# Patient Record
Sex: Female | Born: 1950 | Race: White | Hispanic: No | Smoking: Current every day smoker
Health system: Southern US, Community
[De-identification: ages and names within clinical notes are randomized; demographics above are authoritative.]

## PROBLEM LIST (undated history)

## (undated) DIAGNOSIS — Z9289 Personal history of other medical treatment: Secondary | ICD-10-CM

## (undated) DIAGNOSIS — K279 Peptic ulcer, site unspecified, unspecified as acute or chronic, without hemorrhage or perforation: Secondary | ICD-10-CM

## (undated) DIAGNOSIS — I2510 Atherosclerotic heart disease of native coronary artery without angina pectoris: Secondary | ICD-10-CM

## (undated) DIAGNOSIS — E039 Hypothyroidism, unspecified: Secondary | ICD-10-CM

## (undated) DIAGNOSIS — F32A Depression, unspecified: Secondary | ICD-10-CM

## (undated) DIAGNOSIS — D509 Iron deficiency anemia, unspecified: Secondary | ICD-10-CM

## (undated) DIAGNOSIS — E785 Hyperlipidemia, unspecified: Secondary | ICD-10-CM

## (undated) DIAGNOSIS — M4800 Spinal stenosis, site unspecified: Secondary | ICD-10-CM

## (undated) DIAGNOSIS — I739 Peripheral vascular disease, unspecified: Secondary | ICD-10-CM

## (undated) DIAGNOSIS — I10 Essential (primary) hypertension: Secondary | ICD-10-CM

## (undated) DIAGNOSIS — G8929 Other chronic pain: Secondary | ICD-10-CM

## (undated) DIAGNOSIS — I5022 Chronic systolic (congestive) heart failure: Secondary | ICD-10-CM

## (undated) DIAGNOSIS — K219 Gastro-esophageal reflux disease without esophagitis: Secondary | ICD-10-CM

## (undated) DIAGNOSIS — Z9981 Dependence on supplemental oxygen: Secondary | ICD-10-CM

## (undated) DIAGNOSIS — M25551 Pain in right hip: Secondary | ICD-10-CM

## (undated) DIAGNOSIS — M549 Dorsalgia, unspecified: Secondary | ICD-10-CM

## (undated) DIAGNOSIS — M359 Systemic involvement of connective tissue, unspecified: Secondary | ICD-10-CM

## (undated) DIAGNOSIS — F329 Major depressive disorder, single episode, unspecified: Secondary | ICD-10-CM

## (undated) DIAGNOSIS — I428 Other cardiomyopathies: Secondary | ICD-10-CM

## (undated) DIAGNOSIS — M1990 Unspecified osteoarthritis, unspecified site: Secondary | ICD-10-CM

## (undated) DIAGNOSIS — J189 Pneumonia, unspecified organism: Secondary | ICD-10-CM

## (undated) HISTORY — DX: Essential (primary) hypertension: I10

## (undated) HISTORY — DX: Hyperlipidemia, unspecified: E78.5

## (undated) HISTORY — DX: Hypothyroidism, unspecified: E03.9

## (undated) HISTORY — DX: Iron deficiency anemia, unspecified: D50.9

## (undated) HISTORY — DX: Spinal stenosis, site unspecified: M48.00

## (undated) HISTORY — DX: Peptic ulcer, site unspecified, unspecified as acute or chronic, without hemorrhage or perforation: K27.9

## (undated) HISTORY — DX: Major depressive disorder, single episode, unspecified: F32.9

## (undated) HISTORY — DX: Other chronic pain: G89.29

## (undated) HISTORY — DX: Chronic systolic (congestive) heart failure: I50.22

## (undated) HISTORY — PX: BLADDER REPAIR: SHX76

## (undated) HISTORY — DX: Other cardiomyopathies: I42.8

## (undated) HISTORY — DX: Dorsalgia, unspecified: M54.9

## (undated) HISTORY — DX: Atherosclerotic heart disease of native coronary artery without angina pectoris: I25.10

## (undated) HISTORY — DX: Depression, unspecified: F32.A

---
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
## 1979-01-05 HISTORY — PX: DILATION AND CURETTAGE OF UTERUS: SHX78

## 1989-05-06 HISTORY — PX: ABDOMINAL HYSTERECTOMY: SHX81

## 2004-11-30 ENCOUNTER — Ambulatory Visit: Payer: Self-pay | Admitting: Family Medicine

## 2005-05-06 HISTORY — PX: CARDIAC CATHETERIZATION: SHX172

## 2005-07-30 ENCOUNTER — Ambulatory Visit: Payer: Self-pay | Admitting: Family Medicine

## 2007-12-25 ENCOUNTER — Emergency Department: Payer: Self-pay | Admitting: Emergency Medicine

## 2008-01-08 ENCOUNTER — Ambulatory Visit: Payer: Self-pay | Admitting: Unknown Physician Specialty

## 2009-10-04 ENCOUNTER — Ambulatory Visit: Payer: Self-pay | Admitting: Internal Medicine

## 2009-10-11 ENCOUNTER — Inpatient Hospital Stay: Payer: Self-pay | Admitting: Internal Medicine

## 2009-10-27 ENCOUNTER — Ambulatory Visit: Payer: Self-pay | Admitting: Internal Medicine

## 2009-11-03 ENCOUNTER — Ambulatory Visit: Payer: Self-pay | Admitting: Internal Medicine

## 2009-12-04 ENCOUNTER — Ambulatory Visit: Payer: Self-pay | Admitting: Internal Medicine

## 2012-05-19 LAB — COMPREHENSIVE METABOLIC PANEL
Alkaline Phosphatase: 193 U/L — ABNORMAL HIGH (ref 50–136)
Anion Gap: 6 — ABNORMAL LOW (ref 7–16)
Bilirubin,Total: 0.3 mg/dL (ref 0.2–1.0)
Chloride: 103 mmol/L (ref 98–107)
Glucose: 94 mg/dL (ref 65–99)
Osmolality: 267 (ref 275–301)
Total Protein: 7.2 g/dL (ref 6.4–8.2)

## 2012-05-19 LAB — CBC
HCT: 34.9 % — ABNORMAL LOW (ref 35.0–47.0)
MCH: 32.5 pg (ref 26.0–34.0)
MCV: 102 fL — ABNORMAL HIGH (ref 80–100)
Platelet: 325 10*3/uL (ref 150–440)
RDW: 17.3 % — ABNORMAL HIGH (ref 11.5–14.5)
WBC: 5.3 10*3/uL (ref 3.6–11.0)

## 2012-05-20 ENCOUNTER — Observation Stay: Payer: Self-pay | Admitting: Internal Medicine

## 2012-05-20 LAB — URINALYSIS, COMPLETE
Bacteria: NONE SEEN
Glucose,UR: NEGATIVE mg/dL (ref 0–75)
Hyaline Cast: 27
Ketone: NEGATIVE
Ph: 5 (ref 4.5–8.0)
Protein: NEGATIVE
RBC,UR: 15 /HPF (ref 0–5)
Squamous Epithelial: 2
WBC UR: 22 /HPF (ref 0–5)

## 2012-05-20 LAB — IRON AND TIBC
Iron Bind.Cap.(Total): 213 ug/dL — ABNORMAL LOW (ref 250–450)
Iron Saturation: 31 %
Iron: 67 ug/dL (ref 50–170)
Unbound Iron-Bind.Cap.: 146 ug/dL

## 2012-05-20 LAB — FERRITIN: Ferritin (ARMC): 33 ng/mL (ref 8–388)

## 2012-05-20 LAB — TSH: Thyroid Stimulating Horm: >100 u[IU]/mL

## 2012-05-20 LAB — CK-MB: CK-MB: 3.4 ng/mL (ref 0.5–3.6)

## 2012-05-20 LAB — FOLATE: Folic Acid: 15.6 ng/mL (ref 3.1–100.0)

## 2012-08-06 ENCOUNTER — Ambulatory Visit: Payer: Self-pay | Admitting: Internal Medicine

## 2012-10-06 LAB — CBC WITH DIFFERENTIAL/PLATELET
HCT: 34.3 % — ABNORMAL LOW (ref 35.0–47.0)
HGB: 12 g/dL (ref 12.0–16.0)
MCV: 100 fL (ref 80–100)
Monocytes: 2 %
RDW: 17.5 % — ABNORMAL HIGH (ref 11.5–14.5)

## 2012-10-06 LAB — COMPREHENSIVE METABOLIC PANEL
Albumin: 1.9 g/dL — ABNORMAL LOW (ref 3.4–5.0)
Alkaline Phosphatase: 611 U/L — ABNORMAL HIGH (ref 50–136)
Anion Gap: 12 (ref 7–16)
Bilirubin,Total: 1.1 mg/dL — ABNORMAL HIGH (ref 0.2–1.0)
Calcium, Total: 7.2 mg/dL — ABNORMAL LOW (ref 8.5–10.1)
Chloride: 99 mmol/L (ref 98–107)
Creatinine: 1.04 mg/dL (ref 0.60–1.30)
EGFR (African American): >60
Osmolality: 266 (ref 275–301)
SGOT(AST): 133 U/L — ABNORMAL HIGH (ref 15–37)
SGPT (ALT): 33 U/L (ref 12–78)

## 2012-10-06 LAB — CK TOTAL AND CKMB (NOT AT ARMC): CK-MB: 4.4 ng/mL — ABNORMAL HIGH (ref 0.5–3.6)

## 2012-10-06 LAB — TROPONIN I: Troponin-I: 0.03 ng/mL

## 2012-10-06 LAB — ETHANOL: Ethanol: <3 mg/dL

## 2012-10-07 ENCOUNTER — Inpatient Hospital Stay: Payer: Self-pay | Admitting: Psychiatry

## 2012-10-07 LAB — DRUG SCREEN, URINE
Amphetamines, Ur Screen: NEGATIVE (ref ?–1000)
Barbiturates, Ur Screen: NEGATIVE (ref ?–200)
Benzodiazepine, Ur Scrn: POSITIVE (ref ?–200)
Cocaine Metabolite,Ur ~~LOC~~: NEGATIVE (ref ?–300)
Methadone, Ur Screen: NEGATIVE (ref ?–300)
Opiate, Ur Screen: POSITIVE (ref ?–300)
Phencyclidine (PCP) Ur S: NEGATIVE (ref ?–25)
Tricyclic, Ur Screen: NEGATIVE (ref ?–1000)

## 2012-10-07 LAB — URINALYSIS, COMPLETE
Bilirubin,UR: NEGATIVE
Glucose,UR: NEGATIVE mg/dL (ref 0–75)
Hyaline Cast: 13
Nitrite: POSITIVE
Ph: 6 (ref 4.5–8.0)
WBC UR: 162 /HPF (ref 0–5)

## 2012-10-10 LAB — CREATININE, SERUM
Creatinine: 0.91 mg/dL (ref 0.60–1.30)
EGFR (African American): >60

## 2012-10-12 LAB — CULTURE, BLOOD (SINGLE)

## 2012-11-12 ENCOUNTER — Other Ambulatory Visit: Payer: Self-pay | Admitting: Family Medicine

## 2012-11-12 LAB — CBC WITH DIFFERENTIAL/PLATELET
Basophil #: 0 10*3/uL (ref 0.0–0.1)
Eosinophil #: 0.1 10*3/uL (ref 0.0–0.7)
Eosinophil %: 1.9 %
HGB: 9.5 g/dL — ABNORMAL LOW (ref 12.0–16.0)
MCH: 33.1 pg (ref 26.0–34.0)
MCV: 104 fL — ABNORMAL HIGH (ref 80–100)
Monocyte %: 8 %
Neutrophil #: 4.6 10*3/uL (ref 1.4–6.5)
Neutrophil %: 64.9 %
Platelet: 472 10*3/uL — ABNORMAL HIGH (ref 150–440)
RDW: 14.9 % — ABNORMAL HIGH (ref 11.5–14.5)
WBC: 7 10*3/uL (ref 3.6–11.0)

## 2012-11-12 LAB — COMPREHENSIVE METABOLIC PANEL
Alkaline Phosphatase: 582 U/L — ABNORMAL HIGH (ref 50–136)
BUN: 21 mg/dL — ABNORMAL HIGH (ref 7–18)
Calcium, Total: 9.2 mg/dL (ref 8.5–10.1)
Chloride: 112 mmol/L — ABNORMAL HIGH (ref 98–107)
Glucose: 120 mg/dL — ABNORMAL HIGH (ref 65–99)
Osmolality: 287 (ref 275–301)
Potassium: 5.1 mmol/L (ref 3.5–5.1)
Sodium: 142 mmol/L (ref 136–145)
Total Protein: 6.1 g/dL — ABNORMAL LOW (ref 6.4–8.2)

## 2012-11-12 LAB — URINALYSIS, COMPLETE
Bilirubin,UR: NEGATIVE
Blood: NEGATIVE
Glucose,UR: NEGATIVE mg/dL (ref 0–75)
Hyaline Cast: 12
Ketone: NEGATIVE
RBC,UR: <1 /HPF (ref 0–5)
Squamous Epithelial: 1

## 2012-11-12 LAB — SEDIMENTATION RATE: Erythrocyte Sed Rate: 72 mm/hr — ABNORMAL HIGH (ref 0–30)

## 2012-11-16 LAB — URINE CULTURE

## 2013-06-07 ENCOUNTER — Ambulatory Visit: Payer: Self-pay | Admitting: Family Medicine

## 2013-09-05 DIAGNOSIS — I2510 Atherosclerotic heart disease of native coronary artery without angina pectoris: Secondary | ICD-10-CM | POA: Diagnosis present

## 2014-02-08 ENCOUNTER — Inpatient Hospital Stay: Payer: Self-pay | Admitting: Internal Medicine

## 2014-02-08 LAB — TROPONIN I
TROPONIN-I: 0.08 ng/mL — AB
Troponin-I: 0.08 ng/mL — ABNORMAL HIGH

## 2014-02-08 LAB — COMPREHENSIVE METABOLIC PANEL
ALBUMIN: 3.2 g/dL — AB (ref 3.4–5.0)
ALT: 7 U/L — AB
Alkaline Phosphatase: 137 U/L — ABNORMAL HIGH
Anion Gap: 6 — ABNORMAL LOW (ref 7–16)
BILIRUBIN TOTAL: 0.5 mg/dL (ref 0.2–1.0)
BUN: 9 mg/dL (ref 7–18)
CREATININE: 0.85 mg/dL (ref 0.60–1.30)
Calcium, Total: 8.4 mg/dL — ABNORMAL LOW (ref 8.5–10.1)
Chloride: 106 mmol/L (ref 98–107)
Co2: 30 mmol/L (ref 21–32)
EGFR (African American): >60
EGFR (Non-African Amer.): >60
Glucose: 93 mg/dL (ref 65–99)
OSMOLALITY: 282 (ref 275–301)
POTASSIUM: 2.4 mmol/L — AB (ref 3.5–5.1)
SGOT(AST): 18 U/L (ref 15–37)
SODIUM: 142 mmol/L (ref 136–145)
Total Protein: 7.1 g/dL (ref 6.4–8.2)

## 2014-02-08 LAB — CK TOTAL AND CKMB (NOT AT ARMC)
CK, Total: 76 U/L
CK, Total: 76 U/L
CK-MB: 1.5 ng/mL (ref 0.5–3.6)
CK-MB: 1.5 ng/mL (ref 0.5–3.6)

## 2014-02-08 LAB — MAGNESIUM: Magnesium: 1.5 mg/dL — ABNORMAL LOW

## 2014-02-08 LAB — CBC
HCT: 36.6 % (ref 35.0–47.0)
HGB: 11.5 g/dL — ABNORMAL LOW (ref 12.0–16.0)
MCH: 25.6 pg — AB (ref 26.0–34.0)
MCHC: 31.5 g/dL — ABNORMAL LOW (ref 32.0–36.0)
MCV: 81 fL (ref 80–100)
PLATELETS: 348 10*3/uL (ref 150–440)
RBC: 4.51 10*6/uL (ref 3.80–5.20)
RDW: 18.4 % — ABNORMAL HIGH (ref 11.5–14.5)
WBC: 8.9 10*3/uL (ref 3.6–11.0)

## 2014-02-08 LAB — TSH: Thyroid Stimulating Horm: 45.4 u[IU]/mL — ABNORMAL HIGH

## 2014-02-08 LAB — PRO B NATRIURETIC PEPTIDE: B-TYPE NATIURETIC PEPTID: 21532 pg/mL — AB (ref 0–125)

## 2014-02-09 LAB — POTASSIUM: Potassium: 3.1 mmol/L — ABNORMAL LOW (ref 3.5–5.1)

## 2014-02-09 LAB — TROPONIN I: TROPONIN-I: 0.06 ng/mL — AB

## 2014-02-09 LAB — CK TOTAL AND CKMB (NOT AT ARMC)
CK, Total: 82 U/L
CK-MB: 1.9 ng/mL (ref 0.5–3.6)

## 2014-02-09 LAB — MAGNESIUM: Magnesium: 1.4 mg/dL — ABNORMAL LOW

## 2014-02-10 LAB — BASIC METABOLIC PANEL
ANION GAP: 6 — AB (ref 7–16)
BUN: 10 mg/dL (ref 7–18)
CHLORIDE: 100 mmol/L (ref 98–107)
CO2: 34 mmol/L — AB (ref 21–32)
Calcium, Total: 8.3 mg/dL — ABNORMAL LOW (ref 8.5–10.1)
Creatinine: 0.83 mg/dL (ref 0.60–1.30)
EGFR (African American): >60
EGFR (Non-African Amer.): >60
Glucose: 81 mg/dL (ref 65–99)
OSMOLALITY: 277 (ref 275–301)
Potassium: 3.4 mmol/L — ABNORMAL LOW (ref 3.5–5.1)
SODIUM: 140 mmol/L (ref 136–145)

## 2014-02-11 ENCOUNTER — Other Ambulatory Visit: Payer: Self-pay | Admitting: Nurse Practitioner

## 2014-02-11 ENCOUNTER — Telehealth: Payer: Self-pay

## 2014-02-11 ENCOUNTER — Encounter: Payer: Self-pay | Admitting: Nurse Practitioner

## 2014-02-11 DIAGNOSIS — I10 Essential (primary) hypertension: Secondary | ICD-10-CM

## 2014-02-11 DIAGNOSIS — I341 Nonrheumatic mitral (valve) prolapse: Secondary | ICD-10-CM

## 2014-02-11 DIAGNOSIS — R7989 Other specified abnormal findings of blood chemistry: Secondary | ICD-10-CM

## 2014-02-11 DIAGNOSIS — I5023 Acute on chronic systolic (congestive) heart failure: Secondary | ICD-10-CM

## 2014-02-11 LAB — BASIC METABOLIC PANEL
ANION GAP: 6 — AB (ref 7–16)
BUN: 15 mg/dL (ref 7–18)
CO2: 37 mmol/L — AB (ref 21–32)
Calcium, Total: 8.6 mg/dL (ref 8.5–10.1)
Chloride: 95 mmol/L — ABNORMAL LOW (ref 98–107)
Creatinine: 0.96 mg/dL (ref 0.60–1.30)
EGFR (African American): >60
EGFR (Non-African Amer.): >60
Glucose: 83 mg/dL (ref 65–99)
OSMOLALITY: 276 (ref 275–301)
Potassium: 3.2 mmol/L — ABNORMAL LOW (ref 3.5–5.1)
Sodium: 138 mmol/L (ref 136–145)

## 2014-02-11 LAB — TSH: THYROID STIMULATING HORM: 96.4 u[IU]/mL — AB

## 2014-02-11 LAB — T4, FREE: Free Thyroxine: 0.53 ng/dL — ABNORMAL LOW (ref 0.76–1.46)

## 2014-02-11 LAB — MAGNESIUM: MAGNESIUM: 1.6 mg/dL — AB

## 2014-02-11 NOTE — Telephone Encounter (Signed)
Attempted to contact pt regarding discharge from [facility] on 02/11/14.  No answer, no machine.

## 2014-02-15 ENCOUNTER — Encounter: Payer: Self-pay | Admitting: Physician Assistant

## 2014-02-16 NOTE — Progress Notes (Signed)
Patient Name: [name], [name] [birth year], MRN [id number]  Date of Encounter: 02/17/2014  Primary Care Provider:  No primary provider on file. Primary Cardiologist:  Previously [facility], now Dr. Rockey Situ, MD  Patient Profile:  63 y.o. female with history below here for hospital follow up after her recent admission from 10/6-10/9 for acute on chronic systolic CHF, demnad ischemia, hypokalemia, hypomagnesemia, and acute bronchitis.     Problem List:   Past Medical History  Diagnosis Date  . NICM (nonischemic cardiomyopathy)     a. 2008 Echo: EF 20% [facility]);  b. 2011 Echo: EF 50-55% ([facility]);  c. 02/2014 Echo: EF 20-25%, mod dil LA/RA, severe MR, mod-sev TR.  [name] Chronic systolic CHF (congestive heart failure)     a. 02/2014 Echo: EF 20-25%, severe MR, tricuspid regurg, mod dilated LA & RA  . HTN (hypertension)   . Hyperlipidemia   . Tobacco abuse   . Hypothyroidism     a. 02/2014 TSH 96.4.  . Spinal stenosis   . Chronic back pain   . PUD (peptic ulcer disease)   . Elevated troponin    Past Surgical History  Procedure Laterality Date  . Vaginal hysterectomy    . Cardiac catheterization  2007    [facility]     Allergies:  Allergies  Allergen Reactions  . Codeine     Hives      HPI:  63 y.o. female with the above problem list here for hospital follow up.   She has a h/o MI in 2007 and says that she underwent cath @ [facility] revealing nl cors. She was dx with NICM and medically managed. Per chart, EF was initally 20% but improved to 50-55% by 2011. She has not seen cardiology in several years. She says that she was in her usual state of health until about 2 wks prior to her admission into the hospital on 10/6. She [place] the [place] and says that maybe she overdid it on salt. While there, she noted increasing abdominal girth and gradual weight gain. When she returned home 10/2, she was very short of breath with minimal activity. She has Lasix to  use on a prn basis and took one 10/2 PM with good diuresis and drop in wt by six lbs. She continued to feel dyspneic and orthopneic and for that reason presented to [facility] on 10/6 for further eval. At [facility], BNP was elevated @ 21K and CXR showed CHF w/ interstitial edema. She was admitted and placed on IV Lasix with good diuresis and stable renal fxn. She was also found to be hypokalemic/magnesemic and required supplementation. She is minus 4L and wt is down 3 lbs for the admission. She has had intermittent, throbbing chest pain, which can last days at a time. Troponin was mildly elevated with a flat trend. TSH was found to be markedly elevated @ 96.4 and was found to be markedly elevated @ 96.4 and synthroid has been adjusted. Echo was done on 10/7 revealing and EF of 20-25% with sev MR and mod-sev TR. While in the hospital it was recommended that spiro be added to her medication regimen given her ongoing hypokalemia, she continue Lasix 40 mg daily, low-dose Coreg was added, she was continued on her ACEi. Planning for outpatient Lexiscan as it has been 8 years since her last one. It was suspected that the chest pain she experienced prior to her admission was related to her volume overload/hypothyroidism. She will also  plan for an outpatient echo as there was no murmur heard while inpatient. There was question if MR functional in the setting of LV dysfxn, elevated filling pressures, and volume overload.   Today she comes in stating she has a productive cough of green sputum. She is sore from coughing so much. Subjective fever and chills. Chest wall is sore, but no chest pain. She requests a cough medication. She reports her weight as being slightly higher (dry weight 135). Denies any increased dyspnea. Tolerating medications without issues.   Home Medications:  Prior to Admission medications   Not on File     Weights: Filed Weights   02/17/14 1345  Weight: 141 lb 12 oz (64.297 kg)     Review of  Systems:  All other systems reviewed and are otherwise negative except as noted above.  Physical Exam:  Blood pressure 138/60, pulse 85, height 5\' 5"  (1.651 m), weight 141 lb 12 oz (64.297 kg).  General: Pleasant, NAD Psych: Normal affect. Neuro: Alert and oriented X 3. Moves all extremities spontaneously. HEENT: Normal  Neck: Supple without bruits. Elevated JVD to the jaw. Lungs:  Resp regular and unlabored, CTA. Heart: RRR no s3, s4. 3/6 systolic murmur. Abdomen: Soft, non-tender, non-distended, BS + x 4.  Extremities: No clubbing, cyanosis or edema. DP/PT/Radials 2+ and equal bilaterally.   Accessory Clinical Findings:  EKG - NSR, 85, LVH, lateral st depression likely 2/2 LV strain   Assessment & Plan:  1. Chronic systolic CHF/NICM: -She has not taken her Lasix yet today - she reports her weight as being slightly higher (dry weight 135), advised her to go ahead and take her Lasix 40 mg when she gets home -Increase her Lasix to 60 mg x 1 tomorrow, then go back down to 40 mg daily -Continue spiro 12.5 mg daily, Coreg 3.125 mg bid, enalapril 5 mg bid   2. Severe MR: -Murmur is heard on today's exam -Repeat echo  -Medications per above  3. History of supply demand ischemia: -Likely 2/2 volume overload. Flat trend with a peak of 0.08 -Lexiscan Myoview (last ischemic evaluation 8 years ago) -Will likely need cardiac catheterization   4. HTN: -Controlled -Continue current medications   5. Tobacco abuse: -Complete cessation advised  6. Hypothyroidism:  -Synthroid was increased from 75 mcg to 125 mcg during this past hospital admission - follow up with IM/Endocrine  7. Hypokalemia/hypomagnesemia: -Spiro 12.5 mg daily was added during past admission -Check BMET/Mg  8. URI: -Her chest wall is sore from increased coughing. This is reproducible to palpation.  -Tussionex 1 tsp po q 12 hours prn cough, SED   Christell Faith, PA-C [facility] [address] [place], [place] [id number] [phone] [place] 02/17/2014, 5:40 PM

## 2014-02-17 ENCOUNTER — Ambulatory Visit (INDEPENDENT_AMBULATORY_CARE_PROVIDER_SITE_OTHER): Payer: Medicare Other | Admitting: Physician Assistant

## 2014-02-17 ENCOUNTER — Encounter: Payer: Self-pay | Admitting: Physician Assistant

## 2014-02-17 VITALS — BP 138/60 | HR 85 | Ht 65.0 in | Wt 141.8 lb

## 2014-02-17 DIAGNOSIS — R778 Other specified abnormalities of plasma proteins: Secondary | ICD-10-CM

## 2014-02-17 DIAGNOSIS — R7989 Other specified abnormal findings of blood chemistry: Secondary | ICD-10-CM

## 2014-02-17 DIAGNOSIS — E038 Other specified hypothyroidism: Secondary | ICD-10-CM

## 2014-02-17 DIAGNOSIS — I340 Nonrheumatic mitral (valve) insufficiency: Secondary | ICD-10-CM

## 2014-02-17 DIAGNOSIS — R0602 Shortness of breath: Secondary | ICD-10-CM

## 2014-02-17 DIAGNOSIS — I5022 Chronic systolic (congestive) heart failure: Secondary | ICD-10-CM

## 2014-02-17 DIAGNOSIS — R079 Chest pain, unspecified: Secondary | ICD-10-CM

## 2014-02-17 DIAGNOSIS — Z720 Tobacco use: Secondary | ICD-10-CM

## 2014-02-17 MED ORDER — HYDROCOD POLST-CHLORPHEN POLST 10-8 MG/5ML PO LQCR: 5.0000 mL | Freq: Two times a day (BID) | ORAL | Status: DC | PRN

## 2014-02-17 NOTE — Patient Instructions (Addendum)
Please increase your Lasix to 60mg  tomorrow, Then resume your regular dose of 40mg  daily  We will draw labs today:  CBC, BMET  Please follow up in 2 weeks w/ Christell Faith, PA  [place]  Your caregiver has ordered a Stress Test with nuclear imaging. The purpose of this test is to evaluate the blood supply to your heart muscle. This procedure is referred to as a "Non-Invasive Stress Test." This is because other than having an IV started in your vein, nothing is inserted or "invades" your body. Cardiac stress tests are done to find areas of poor blood flow to the heart by determining the extent of coronary artery disease (CAD). Some patients exercise on a treadmill, which naturally increases the blood flow to your heart, while others who are  unable to walk on a treadmill due to physical limitations have a pharmacologic/chemical stress agent called Lexiscan . This medicine will mimic walking on a treadmill by temporarily increasing your coronary blood flow.   Please note: these test may take anywhere between 2-4 hours to complete  PLEASE REPORT TO [place] AT THE FIRST DESK WILL DIRECT YOU WHERE TO GO  Date of Procedure:____Thursday, October 22______________  Arrival Time for Procedure:_______7:45am_________________  Instructions regarding medication:   __X__:  Hold betablocker(s) night before procedure and morning of procedure:  CARVEDILOL   PLEASE NOTIFY THE OFFICE AT LEAST 24 HOURS IN ADVANCE IF YOU ARE UNABLE TO KEEP YOUR APPOINTMENT.  [phone] AND  PLEASE NOTIFY NUCLEAR MEDICINE AT [facility] AT LEAST 24 HOURS IN ADVANCE IF YOU ARE UNABLE TO KEEP YOUR APPOINTMENT. [phone]  How to prepare for your Myoview test:  1. Do not eat or drink after midnight 2. No caffeine for 24 hours prior to test 3. No smoking 24 hours prior to test. 4. Your medication may be taken with water.  If your doctor stopped a medication because of this test, do not take  that medication. 5. Ladies, please do not wear dresses.  Skirts or pants are appropriate. Please wear a short sleeve shirt. 6. No perfume, cologne or lotion.

## 2014-02-18 LAB — CBC WITH DIFFERENTIAL
Basophils Absolute: 0 10*3/uL (ref 0.0–0.2)
Basos: 1 %
EOS ABS: 0.2 10*3/uL (ref 0.0–0.4)
EOS: 3 %
HCT: 33.8 % — ABNORMAL LOW (ref 34.0–46.6)
Hemoglobin: 10.8 g/dL — ABNORMAL LOW (ref 11.1–15.9)
IMMATURE GRANULOCYTES: 0 %
Immature Grans (Abs): 0 10*3/uL (ref 0.0–0.1)
Lymphocytes Absolute: 2.3 10*3/uL (ref 0.7–3.1)
Lymphs: 26 %
MCH: 25.3 pg — AB (ref 26.6–33.0)
MCHC: 32 g/dL (ref 31.5–35.7)
MCV: 79 fL (ref 79–97)
MONOS ABS: 0.5 10*3/uL (ref 0.1–0.9)
Monocytes: 6 %
NEUTROS PCT: 64 %
Neutrophils Absolute: 5.8 10*3/uL (ref 1.4–7.0)
Platelets: 368 10*3/uL (ref 150–379)
RBC: 4.27 x10E6/uL (ref 3.77–5.28)
RDW: 19.3 % — ABNORMAL HIGH (ref 12.3–15.4)
WBC: 8.8 10*3/uL (ref 3.4–10.8)

## 2014-02-18 LAB — BASIC METABOLIC PANEL
BUN/Creatinine Ratio: 11 (ref 11–26)
BUN: 8 mg/dL (ref 8–27)
CALCIUM: 8.7 mg/dL (ref 8.7–10.3)
CO2: 27 mmol/L (ref 18–29)
Chloride: 101 mmol/L (ref 97–108)
Creatinine, Ser: 0.75 mg/dL (ref 0.57–1.00)
GFR calc Af Amer: 97 mL/min/{1.73_m2} (ref >59)
GFR calc non Af Amer: 85 mL/min/{1.73_m2} (ref >59)
Glucose: 99 mg/dL (ref 65–99)
POTASSIUM: 3.7 mmol/L (ref 3.5–5.2)
SODIUM: 142 mmol/L (ref 134–144)

## 2014-02-24 ENCOUNTER — Ambulatory Visit: Payer: Self-pay | Admitting: Cardiovascular Disease

## 2014-02-24 DIAGNOSIS — R079 Chest pain, unspecified: Secondary | ICD-10-CM

## 2014-02-25 ENCOUNTER — Other Ambulatory Visit: Payer: Self-pay | Admitting: *Deleted

## 2014-02-25 DIAGNOSIS — R0602 Shortness of breath: Secondary | ICD-10-CM

## 2014-02-25 DIAGNOSIS — I5022 Chronic systolic (congestive) heart failure: Secondary | ICD-10-CM

## 2014-02-25 DIAGNOSIS — R079 Chest pain, unspecified: Secondary | ICD-10-CM

## 2014-02-25 DIAGNOSIS — R778 Other specified abnormalities of plasma proteins: Secondary | ICD-10-CM

## 2014-02-25 DIAGNOSIS — R7989 Other specified abnormal findings of blood chemistry: Secondary | ICD-10-CM

## 2014-03-01 ENCOUNTER — Encounter: Payer: Self-pay | Admitting: Physician Assistant

## 2014-03-03 ENCOUNTER — Encounter: Payer: Self-pay | Admitting: Physician Assistant

## 2014-03-03 ENCOUNTER — Ambulatory Visit (INDEPENDENT_AMBULATORY_CARE_PROVIDER_SITE_OTHER): Payer: Medicare Other | Admitting: Physician Assistant

## 2014-03-03 VITALS — BP 158/81 | HR 100 | Ht 65.0 in | Wt 141.0 lb

## 2014-03-03 DIAGNOSIS — I429 Cardiomyopathy, unspecified: Secondary | ICD-10-CM

## 2014-03-03 DIAGNOSIS — I340 Nonrheumatic mitral (valve) insufficiency: Secondary | ICD-10-CM

## 2014-03-03 DIAGNOSIS — I428 Other cardiomyopathies: Secondary | ICD-10-CM

## 2014-03-03 DIAGNOSIS — E038 Other specified hypothyroidism: Secondary | ICD-10-CM

## 2014-03-03 DIAGNOSIS — I10 Essential (primary) hypertension: Secondary | ICD-10-CM

## 2014-03-03 DIAGNOSIS — R079 Chest pain, unspecified: Secondary | ICD-10-CM

## 2014-03-03 MED ORDER — CARVEDILOL 6.25 MG PO TABS: 6.2500 mg | ORAL_TABLET | Freq: Two times a day (BID) | ORAL | Status: DC

## 2014-03-03 NOTE — Patient Instructions (Addendum)
[facility] Cardiac Cath Instructions   You are scheduled for a Cardiac Cath on:_____11/12/15____________________  Please arrive at _0730______am on the day of your procedure  You will need to pre-register prior to the day of your procedure.  Enter through the [employer] at [facility].  Registration is the first desk on your right.  Please take the procedure order we have given you in order to be registered appropriately  Do not eat/drink anything after midnight  Someone will need to drive you home  It is recommended someone be with you for the first 24 hours after your procedure  Wear clothes that are easy to get on/off and wear slip on shoes if possible   Medications bring a current list of all medications with you  _x__ You may take all of your medications the morning of your procedure with enough water to swallow safely   Day of your procedure: Arrive at the [place] entrance.  Free valet service is available.  After entering the [place] please check-in at the registration desk (1st desk on your right) to receive your armband. After receiving your armband someone will escort you to the cardiac cath/special procedures waiting area.  The usual length of stay after your procedure is about 2 to 3 hours.  This can vary.  If you have any questions, please call our office at [phone], or you may call the cardiac cath lab at [facility] directly at [phone]  Your physician has recommended you make the following change in your medication:  Increase Coreg to 6.25 mg twice daily   Please follow up with PCP for Oxygen

## 2014-03-03 NOTE — Progress Notes (Signed)
Patient Name: [name], [name] January 29, 1951, MRN [id number]  Date of Encounter: 03/03/2014  Primary Care Provider:  No primary provider on file. Primary Cardiologist:  Dr. Rockey Situ, MD  Patient Profile:  63 y.o. female with history below here to discuss her cardiac cath results.    Problem List:   Past Medical History  Diagnosis Date  . NICM (nonischemic cardiomyopathy)     a. 2008 Echo: EF 20% [facility]);  b. 2011 Echo: EF 50-55% ([facility]);  c. 02/2014 Echo: EF 20-25%, mod dil LA/RA, severe MR, mod-sev TR.  [name] Chronic systolic CHF (congestive heart failure)     a. 02/2014 Echo: EF 20-25%, severe MR, tricuspid regurg, mod dilated LA & RA  . HTN (hypertension)   . Hyperlipidemia   . Tobacco abuse   . Hypothyroidism     a. 02/2014 TSH 96.4.  . Spinal stenosis   . Chronic back pain   . PUD (peptic ulcer disease)   . History of nuclear stress test     a. 02/24/2014: Lexiscan Myoview: no sig ischemia, predominantly mild fixed defect noted in the inf wall on nonattenuation corrected images. On attenuation corrected images small mild perfusion defect in the apical & distal anterior wall w/ possible small mild ischemia. Mod global HK. No EKG changes concerning for ischemia. EF 35%. No artificat noted. Overall, moderate risk study.     Past Surgical History  Procedure Laterality Date  . Vaginal hysterectomy    . Cardiac catheterization  2007    [facility]     Allergies:  Allergies  Allergen Reactions  . Codeine     Hives      HPI:  63 y.o. female with the above problem list here to discuss her recent [place] results that she had on 02/25/2014.   Patient with history of MI in 2007 and says that she underwent cath @ [facility] revealing nl cors. She was dx with NICM and medically managed. Per chart, EF was initally 20% but improved to 50-55% by 2011. She has not seen cardiology in several years. She says that she was in her usual state of health until about 2 wks prior to her  admission into the hospital on 10/6. She went [place] her [place] to the mountains for a vacation and says that maybe she overdid it on salt. While there, she noted increasing abdominal girth and gradual weight gain. When she returned home 10/2, she was very short of breath with minimal activity. She has Lasix to use on a prn basis and took one 10/2 PM with good diuresis and drop in wt by six lbs. She continued to feel dyspneic and orthopneic and for that reason presented to [facility] on 10/6 for further eval. At [facility], BNP was elevated @ 21K and CXR showed CHF w/ interstitial edema. She was admitted and placed on IV Lasix with good diuresis and stable renal fxn. TnI was mildly elevated with a flat trend. TSH was found to be markedly elevated at 96.4. Echo was done on 02/09/2014 that showed an EF of 20-25% with severe MR and moderate to severe TR.   She underwent Lexiscan Myoview 02/25/2014 that showed mild fixed defect noted in the inferior wall on nonattenuation corrected images. Attenuation corrected images shows small mild perfusion defect in the apical and distal anterior wall with possible small mild ischemia in this region. Moderate global HK. LV global function was moderately reduced. No EKG changes concerning for ischemia. EF 35%. Moderate risk study.  She comes in today stating that she is tired. Does not feel like she has any energy. Denies any chest pain, diaphoresis, palpitations, edema, presyncope, or syncope.       Home Medications:  Prior to Admission medications   Medication Sig Start Date End Date Taking? Authorizing Provider  chlorpheniramine-HYDROcodone (TUSSIONEX PENNKINETIC ER) 10-8 MG/5ML LQCR Take 5 mLs by mouth every 12 (twelve) hours as needed for cough. 02/17/14  Yes Female Iafrate M Jeter Tomey, PA-C  enalapril (VASOTEC) 5 MG tablet Take 5 mg by mouth 2 (two) times daily.   Yes Historical Provider, MD  escitalopram (LEXAPRO) 5 MG tablet Take 5 mg by mouth daily.   Yes Historical Provider, MD    furosemide (LASIX) 40 MG tablet Take 40 mg by mouth daily.   Yes Historical Provider, MD  gabapentin (NEURONTIN) 300 MG capsule Take 300 mg by mouth 3 (three) times daily.   Yes Historical Provider, MD  HYDROcodone-acetaminophen (NORCO) 10-325 MG per tablet Take 1 tablet by mouth every 6 (six) hours as needed.   Yes Historical Provider, MD  levothyroxine (SYNTHROID, LEVOTHROID) 125 MCG tablet Take 125 mcg by mouth daily before breakfast.   Yes Historical Provider, MD  spironolactone (ALDACTONE) 25 MG tablet Take 12.5 mg by mouth daily.    Yes Historical Provider, MD  carvedilol (COREG) 6.25 MG tablet Take 1 tablet (6.25 mg total) by mouth 2 (two) times daily. 03/03/14   Rise Mu, PA-C     Weights: Filed Weights   03/03/14 1348  Weight: 141 lb (63.957 kg)     Review of Systems:  All other systems reviewed and are otherwise negative except as noted above.  Physical Exam:  Blood pressure 158/81, pulse 100, height 5\' 5"  (1.651 m), weight 141 lb (63.957 kg).  General: Pleasant, NAD Psych: Normal affect. Neuro: Alert and oriented X 3. Moves all extremities spontaneously. HEENT: Normal  Neck: Supple without bruits or JVD. Lungs:  Resp regular and unlabored, CTA. Heart: RRR no s3, s4. 2/6 systolic murmur. Abdomen: Soft, non-tender, non-distended, BS + x 4.  Extremities: No clubbing, cyanosis or edema. DP/PT/Radials 2+ and equal bilaterally.   Accessory Clinical Findings:  EKG - NSR, 100, nonspecific st depression  Assessment & Plan:  1. NICM: -Must rule out ischemia as a cause of her decreased EF, schedule for cardiac cath -EF 20-25% on echo on 02/09/2014, EF 35% during nuclear stress test 02/25/2014. Given the decreased EF, if there is no significant stenosis will plan for optimization of medical therapy, LifeVest during waiting period, followed by possible ICD placement if EF remains less than or equal to 35% -Increased Coreg to 6.25 mg bid -Pre-cath labs pending  2.  Severe MR: -Recent echo on 02/09/2014 showed EF 20-25%, severe MR, moderate to severe TR -Her valvular heart disease may certainly be playing a role in her CM -Optimize medical therapy at this time  -Post cath can visit with the idea of MVR  3. HTN: -Coreg increased today per above -Continue current medications  4. Severe hypothyroidism: -TSH of 96.4 during hospitalization in early October, currently on Synthroid 125 mcg -Untreated hypothyroidism certainly may also be a cause of her CM  5. Tobacco abuse: -Cessation is a must   Christell Faith, PA-C [place], [place] [address] [phone] [place] 03/03/2014, 2:41 PM

## 2014-03-04 LAB — CBC WITH DIFFERENTIAL
BASOS ABS: 0 10*3/uL (ref 0.0–0.2)
Basos: 1 %
EOS ABS: 0.1 10*3/uL (ref 0.0–0.4)
Eos: 1 %
HCT: 34.3 % (ref 34.0–46.6)
Hemoglobin: 10.4 g/dL — ABNORMAL LOW (ref 11.1–15.9)
Immature Grans (Abs): 0 10*3/uL (ref 0.0–0.1)
Immature Granulocytes: 0 %
LYMPHS: 28 %
Lymphocytes Absolute: 2 10*3/uL (ref 0.7–3.1)
MCH: 24.4 pg — ABNORMAL LOW (ref 26.6–33.0)
MCHC: 30.3 g/dL — ABNORMAL LOW (ref 31.5–35.7)
MCV: 80 fL (ref 79–97)
MONOS ABS: 0.6 10*3/uL (ref 0.1–0.9)
Monocytes: 9 %
NEUTROS ABS: 4.4 10*3/uL (ref 1.4–7.0)
Neutrophils Relative %: 61 %
Platelets: 384 10*3/uL — ABNORMAL HIGH (ref 150–379)
RBC: 4.27 x10E6/uL (ref 3.77–5.28)
RDW: 18.9 % — AB (ref 12.3–15.4)
WBC: 7 10*3/uL (ref 3.4–10.8)

## 2014-03-04 LAB — PROTIME-INR
INR: 1.6 — ABNORMAL HIGH (ref 0.8–1.2)
Prothrombin Time: 16.4 s — ABNORMAL HIGH (ref 9.1–12.0)

## 2014-03-04 LAB — BASIC METABOLIC PANEL
BUN/Creatinine Ratio: 15 (ref 11–26)
BUN: 11 mg/dL (ref 8–27)
CHLORIDE: 99 mmol/L (ref 97–108)
CO2: 25 mmol/L (ref 18–29)
Calcium: 8.9 mg/dL (ref 8.7–10.3)
Creatinine, Ser: 0.71 mg/dL (ref 0.57–1.00)
GFR calc non Af Amer: 90 mL/min/{1.73_m2} (ref >59)
GFR, EST AFRICAN AMERICAN: 104 mL/min/{1.73_m2} (ref >59)
GLUCOSE: 98 mg/dL (ref 65–99)
POTASSIUM: 3.5 mmol/L (ref 3.5–5.2)
SODIUM: 141 mmol/L (ref 134–144)

## 2014-03-09 ENCOUNTER — Other Ambulatory Visit: Payer: Self-pay

## 2014-03-09 DIAGNOSIS — D509 Iron deficiency anemia, unspecified: Secondary | ICD-10-CM

## 2014-03-09 DIAGNOSIS — Z01812 Encounter for preprocedural laboratory examination: Secondary | ICD-10-CM

## 2014-03-15 ENCOUNTER — Other Ambulatory Visit (INDEPENDENT_AMBULATORY_CARE_PROVIDER_SITE_OTHER): Payer: Medicare Other

## 2014-03-15 DIAGNOSIS — Z01812 Encounter for preprocedural laboratory examination: Secondary | ICD-10-CM

## 2014-03-15 DIAGNOSIS — D509 Iron deficiency anemia, unspecified: Secondary | ICD-10-CM

## 2014-03-16 ENCOUNTER — Ambulatory Visit: Payer: Self-pay | Admitting: Physician Assistant

## 2014-03-16 LAB — CBC WITH DIFFERENTIAL/PLATELET
BASOS PCT: 0.9 %
Basophil #: 0.1 10*3/uL (ref 0.0–0.1)
Eosinophil #: 0.1 10*3/uL (ref 0.0–0.7)
Eosinophil %: 0.8 %
HCT: 33.5 % — AB (ref 35.0–47.0)
HGB: 10.5 g/dL — AB (ref 12.0–16.0)
LYMPHS ABS: 1.7 10*3/uL (ref 1.0–3.6)
Lymphocyte %: 24.9 %
MCH: 24.8 pg — ABNORMAL LOW (ref 26.0–34.0)
MCHC: 31.3 g/dL — ABNORMAL LOW (ref 32.0–36.0)
MCV: 79 fL — ABNORMAL LOW (ref 80–100)
Monocyte #: 0.5 x10 3/mm (ref 0.2–0.9)
Monocyte %: 7.2 %
Neutrophil #: 4.6 10*3/uL (ref 1.4–6.5)
Neutrophil %: 66.2 %
Platelet: 412 10*3/uL (ref 150–440)
RBC: 4.23 10*6/uL (ref 3.80–5.20)
RDW: 18.9 % — ABNORMAL HIGH (ref 11.5–14.5)
WBC: 7 10*3/uL (ref 3.6–11.0)

## 2014-03-16 LAB — COMPREHENSIVE METABOLIC PANEL
ALBUMIN: 2.6 g/dL — AB (ref 3.4–5.0)
ALK PHOS: 171 U/L — AB
Anion Gap: 5 — ABNORMAL LOW (ref 7–16)
BUN: 9 mg/dL (ref 7–18)
Bilirubin,Total: 0.3 mg/dL (ref 0.2–1.0)
CO2: 31 mmol/L (ref 21–32)
CREATININE: 0.81 mg/dL (ref 0.60–1.30)
Calcium, Total: 8 mg/dL — ABNORMAL LOW (ref 8.5–10.1)
Chloride: 108 mmol/L — ABNORMAL HIGH (ref 98–107)
EGFR (African American): >60
EGFR (Non-African Amer.): >60
Glucose: 109 mg/dL — ABNORMAL HIGH (ref 65–99)
Osmolality: 286 (ref 275–301)
Potassium: 2.7 mmol/L — ABNORMAL LOW (ref 3.5–5.1)
SGOT(AST): 19 U/L (ref 15–37)
SGPT (ALT): <6 U/L — ABNORMAL LOW
Sodium: 144 mmol/L (ref 136–145)
Total Protein: 6.7 g/dL (ref 6.4–8.2)

## 2014-03-16 LAB — PROTIME-INR
INR: 1.2
PROTHROMBIN TIME: 15.3 s — AB (ref 11.5–14.7)

## 2014-03-17 ENCOUNTER — Ambulatory Visit: Payer: Self-pay | Admitting: Cardiovascular Disease

## 2014-03-17 DIAGNOSIS — I700 Atherosclerosis of aorta: Secondary | ICD-10-CM

## 2014-03-17 DIAGNOSIS — I209 Angina pectoris, unspecified: Secondary | ICD-10-CM

## 2014-03-18 DIAGNOSIS — E039 Hypothyroidism, unspecified: Secondary | ICD-10-CM | POA: Insufficient documentation

## 2014-03-18 DIAGNOSIS — Z9289 Personal history of other medical treatment: Secondary | ICD-10-CM | POA: Insufficient documentation

## 2014-03-18 DIAGNOSIS — M4800 Spinal stenosis, site unspecified: Secondary | ICD-10-CM | POA: Insufficient documentation

## 2014-03-18 DIAGNOSIS — Z720 Tobacco use: Secondary | ICD-10-CM | POA: Insufficient documentation

## 2014-03-18 DIAGNOSIS — E785 Hyperlipidemia, unspecified: Secondary | ICD-10-CM | POA: Insufficient documentation

## 2014-03-18 DIAGNOSIS — M7989 Other specified soft tissue disorders: Secondary | ICD-10-CM | POA: Insufficient documentation

## 2014-03-18 DIAGNOSIS — M549 Dorsalgia, unspecified: Secondary | ICD-10-CM

## 2014-03-18 DIAGNOSIS — G8929 Other chronic pain: Secondary | ICD-10-CM | POA: Insufficient documentation

## 2014-03-18 DIAGNOSIS — I10 Essential (primary) hypertension: Secondary | ICD-10-CM | POA: Insufficient documentation

## 2014-03-18 DIAGNOSIS — K279 Peptic ulcer, site unspecified, unspecified as acute or chronic, without hemorrhage or perforation: Secondary | ICD-10-CM | POA: Insufficient documentation

## 2014-03-18 DIAGNOSIS — I5033 Acute on chronic diastolic (congestive) heart failure: Secondary | ICD-10-CM | POA: Insufficient documentation

## 2014-03-24 ENCOUNTER — Encounter: Payer: Self-pay | Admitting: Cardiovascular Disease

## 2014-03-24 ENCOUNTER — Ambulatory Visit (INDEPENDENT_AMBULATORY_CARE_PROVIDER_SITE_OTHER): Payer: Medicare Other | Admitting: Cardiovascular Disease

## 2014-03-24 ENCOUNTER — Other Ambulatory Visit (HOSPITAL_COMMUNITY): Payer: Self-pay | Admitting: *Deleted

## 2014-03-24 ENCOUNTER — Encounter (INDEPENDENT_AMBULATORY_CARE_PROVIDER_SITE_OTHER): Payer: Medicare Other

## 2014-03-24 VITALS — BP 120/60 | HR 89 | Ht 65.0 in | Wt 131.2 lb

## 2014-03-24 DIAGNOSIS — I70219 Atherosclerosis of native arteries of extremities with intermittent claudication, unspecified extremity: Secondary | ICD-10-CM

## 2014-03-24 DIAGNOSIS — I159 Secondary hypertension, unspecified: Secondary | ICD-10-CM

## 2014-03-24 DIAGNOSIS — I428 Other cardiomyopathies: Secondary | ICD-10-CM

## 2014-03-24 DIAGNOSIS — Z720 Tobacco use: Secondary | ICD-10-CM

## 2014-03-24 DIAGNOSIS — E785 Hyperlipidemia, unspecified: Secondary | ICD-10-CM

## 2014-03-24 DIAGNOSIS — I739 Peripheral vascular disease, unspecified: Secondary | ICD-10-CM

## 2014-03-24 DIAGNOSIS — I5022 Chronic systolic (congestive) heart failure: Secondary | ICD-10-CM

## 2014-03-24 DIAGNOSIS — R0602 Shortness of breath: Secondary | ICD-10-CM

## 2014-03-24 DIAGNOSIS — I429 Cardiomyopathy, unspecified: Secondary | ICD-10-CM

## 2014-03-24 DIAGNOSIS — I70213 Atherosclerosis of native arteries of extremities with intermittent claudication, bilateral legs: Secondary | ICD-10-CM

## 2014-03-24 MED ORDER — CARVEDILOL 12.5 MG PO TABS: 12.5000 mg | ORAL_TABLET | Freq: Two times a day (BID) | ORAL | Status: DC

## 2014-03-24 MED ORDER — FUROSEMIDE 40 MG PO TABS: 40.0000 mg | ORAL_TABLET | Freq: Two times a day (BID) | ORAL | Status: DC | PRN

## 2014-03-24 NOTE — Assessment & Plan Note (Signed)
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 Lower extremity Doppler has been ordered. Will likely need angioplasty, possible stenting of her iliac arterial stenosis for symptom relief Case has been discussed with Dr. Fletcher Anon today.

## 2014-03-24 NOTE — Assessment & Plan Note (Signed)
We have recommended that she take extra Lasix in the afternoon on Monday Wednesday and Friday for periodic episodes of shortness of breath Continue 40 mg every morning

## 2014-03-24 NOTE — Progress Notes (Signed)
Patient ID: [name], female    DOB: [birth year], 63 y.o.   MRN: [id number]  HPI Comments: 63 year old woman with history of nonischemic cardiopathy, ejection fraction 20-25% presenting to the hospital 02/08/2014 with acute on chronic systolic CHF, treated with diuresis  Follow-up stress testing showing  mild fixed defect noted in the inferior wall on nonattenuation corrected images. Attenuation corrected images shows small mild perfusion defect in the apical and distal anterior wall with possible small mild ischemia  She had cardiac catheterization that showed no significant CAD She presents today for follow-up after recent cardiac catheterization  Cardiac catheterization showed no significant coronary disease. Severely depressed ejection fraction, global hypokinesis She had severe right common iliac arterial disease, significant disease of the internal iliac and right common femoral artery. She has claudication symptoms on a regular basis on the right, also has pain in her right buttock  She reports having periodic episodes of shortness of breath. She takes extra Lasix, and eventually her symptoms resolve. Denies having significant lower extremity edema. Not very active at baseline. No significant cough, no orthostasis, no chest pain Right groin has healed, still with a small knot  Cardiac catheterization results again reviewed with her from 03/17/2014 showing no significant CAD, ejection fraction 20%, PAD discussed with her EKG shows normal sinus rhythm with rate 89 bpm, nonspecific ST abnormality in the anterolateral leads    Other past medical history . NICM (nonischemic cardiomyopathy)    a. 2008 Echo: EF 20% [facility]);  b. 2011 Echo: EF 50-55% ([facility]);  c. 02/2014 Echo: EF 20-25%, mod dil LA/RA, severe MR, mod-sev TR. [name] Chronic systolic CHF (congestive heart failure)    a. 02/2014 Echo: EF 20-25%, severe MR, tricuspid regurg, mod dilated LA & RA . HTN  (hypertension)  . Hyperlipidemia  . Tobacco abuse  . Hypothyroidism    a. 02/2014 TSH 96.4. . Spinal stenosis  . Chronic back pain  . PUD (peptic ulcer disease)  . History of nuclear stress test    a. 02/24/2014: Lexiscan Myoview: no sig ischemia, predominantly mild fixed defect noted in the inf wall on nonattenuation corrected images. On attenuation corrected images small mild perfusion defect in the apical & distal anterior wall w/ possible small mild ischemia. Mod global HK. No EKG changes concerning for ischemia. EF 35%. No artificat noted. Overall, moderate risk study.        Social history  reports that she has been smoking Cigarettes.  She has a 20 pack-year smoking history. She does not have any smokeless tobacco history on file. She reports that she does not drink alcohol or use illicit drugs.    Outpatient Encounter Prescriptions as of 03/24/2014  Medication Sig  . carvedilol (COREG) 12.5 MG tablet Take 1 tablet (12.5 mg total) by mouth 2 (two) times daily.  . chlorpheniramine-HYDROcodone (TUSSIONEX PENNKINETIC ER) 10-8 MG/5ML LQCR Take 5 mLs by mouth every 12 (twelve) hours as needed for cough.  . enalapril (VASOTEC) 5 MG tablet Take 5 mg by mouth 2 (two) times daily.  [name] escitalopram (LEXAPRO) 5 MG tablet Take 5 mg by mouth daily.  . furosemide (LASIX) 40 MG tablet Take 1 tablet (40 mg total) by mouth 2 (two) times daily as needed.  . gabapentin (NEURONTIN) 300 MG capsule Take 300 mg by mouth 3 (three) times daily.  [name] HYDROcodone-acetaminophen (NORCO) 10-325 MG per tablet Take 1 tablet by mouth every 6 (six) hours as needed.  [name] levothyroxine (SYNTHROID, LEVOTHROID) 125 MCG tablet Take 125 mcg  by mouth daily before breakfast.  . spironolactone (ALDACTONE) 25 MG tablet Take 12.5 mg by mouth daily.   . [DISCONTINUED] carvedilol (COREG) 6.25 MG tablet Take 1 tablet (6.25 mg total) by mouth 2 (two) times daily.  . [DISCONTINUED] furosemide (LASIX) 40 MG tablet Take 40 mg by mouth  daily.    Review of Systems  Constitutional: Negative.   Respiratory: Positive for shortness of breath.   Cardiovascular: Negative.   Gastrointestinal: Negative.   Musculoskeletal: Negative.        Claudication symptoms on the right  Skin: Negative.   Neurological: Negative.   Hematological: Negative.   All other systems reviewed and are negative.   BP 120/60 mmHg  Pulse 89  Ht 5\' 5"  (1.651 m)  Wt 131 lb 4 oz (59.535 kg)  BMI 21.84 kg/m2  Physical Exam  Constitutional: She is oriented to person, place, and time. She appears well-developed and well-nourished.  HENT:  Head: Normocephalic.  Nose: Nose normal.  Mouth/Throat: Oropharynx is clear and moist.  Eyes: Conjunctivae are normal. Pupils are equal, round, and reactive to light.  Neck: Normal range of motion. Neck supple. No JVD present.  Cardiovascular: Normal rate, regular rhythm, S1 normal, S2 normal, normal heart sounds and intact distal pulses.  Exam reveals no gallop and no friction rub.   No murmur heard. Pulmonary/Chest: Effort normal and breath sounds normal. No respiratory distress. She has no wheezes. She has no rales. She exhibits no tenderness.  Abdominal: Soft. Bowel sounds are normal. She exhibits no distension. There is no tenderness.  Musculoskeletal: Normal range of motion. She exhibits no edema or tenderness.  Lymphadenopathy:    She has no cervical adenopathy.  Neurological: She is alert and oriented to person, place, and time. Coordination normal.  Skin: Skin is warm and dry. No rash noted. No erythema.  Psychiatric: She has a normal mood and affect. Her behavior is normal. Judgment and thought content normal.    Assessment and Plan  Nursing note and vitals reviewed.

## 2014-03-24 NOTE — Assessment & Plan Note (Signed)
We have encouraged him to continue to work on weaning his cigarettes and smoking cessation. He will continue to work on this and does not want any assistance with chantix.  

## 2014-03-24 NOTE — Assessment & Plan Note (Signed)
Severely depressed ejection fraction, nonischemic. We will increase her carvedilol up to 12.5 mg daily. Continue her other medications

## 2014-03-24 NOTE — Patient Instructions (Addendum)
You are doing well. Please increase the coreg to 12.5 mg twice a day (up from the 6.25 mg twice a day) Call the office if you have lightheadedness  We will talk with Dr. Fletcher Anon to discuss your leg blockages  Please take extra lasix after lunch on Monday/Wednesday and Friday  We will order a lower extremity ultrasound for right leg claudication  Please call us if you have new issues that need to be addressed before your next appt.  Your physician wants you to follow-up in: 6 months.  You will receive a reminder letter in the mail two months in advance. If you don't receive a letter, please call our office to schedule the follow-up appointment.

## 2014-03-25 ENCOUNTER — Encounter: Payer: Self-pay | Admitting: Cardiovascular Disease

## 2014-03-28 ENCOUNTER — Inpatient Hospital Stay: Payer: Self-pay | Admitting: Internal Medicine

## 2014-03-28 ENCOUNTER — Other Ambulatory Visit: Payer: Self-pay | Admitting: Physician Assistant

## 2014-03-28 ENCOUNTER — Encounter: Payer: Self-pay | Admitting: Physician Assistant

## 2014-03-28 DIAGNOSIS — E876 Hypokalemia: Secondary | ICD-10-CM

## 2014-03-28 DIAGNOSIS — I429 Cardiomyopathy, unspecified: Secondary | ICD-10-CM

## 2014-03-28 DIAGNOSIS — I739 Peripheral vascular disease, unspecified: Secondary | ICD-10-CM

## 2014-03-28 DIAGNOSIS — J9601 Acute respiratory failure with hypoxia: Secondary | ICD-10-CM

## 2014-03-28 LAB — CBC
HCT: 36.5 % (ref 35.0–47.0)
HGB: 11.1 g/dL — ABNORMAL LOW (ref 12.0–16.0)
MCH: 23.6 pg — ABNORMAL LOW (ref 26.0–34.0)
MCHC: 30.3 g/dL — ABNORMAL LOW (ref 32.0–36.0)
MCV: 78 fL — AB (ref 80–100)
Platelet: 449 10*3/uL — ABNORMAL HIGH (ref 150–440)
RBC: 4.7 10*6/uL (ref 3.80–5.20)
RDW: 20.1 % — AB (ref 11.5–14.5)
WBC: 9.2 10*3/uL (ref 3.6–11.0)

## 2014-03-28 LAB — BASIC METABOLIC PANEL
ANION GAP: 9 (ref 7–16)
BUN: 7 mg/dL (ref 7–18)
Calcium, Total: 8.2 mg/dL — ABNORMAL LOW (ref 8.5–10.1)
Chloride: 103 mmol/L (ref 98–107)
Co2: 29 mmol/L (ref 21–32)
Creatinine: 0.86 mg/dL (ref 0.60–1.30)
Glucose: 110 mg/dL — ABNORMAL HIGH (ref 65–99)
Osmolality: 280 (ref 275–301)
POTASSIUM: 2.8 mmol/L — AB (ref 3.5–5.1)
Sodium: 141 mmol/L (ref 136–145)

## 2014-03-28 LAB — CK TOTAL AND CKMB (NOT AT ARMC)
CK, Total: 45 U/L (ref 26–192)
CK, Total: 32 U/L (ref 26–192)
CK, Total: 30 U/L (ref 26–192)
CK-MB: 1.4 ng/mL (ref 0.5–3.6)
CK-MB: 1.5 ng/mL (ref 0.5–3.6)
CK-MB: 1.5 ng/mL (ref 0.5–3.6)

## 2014-03-28 LAB — TROPONIN I
TROPONIN-I: 0.13 ng/mL — AB
Troponin-I: 0.13 ng/mL — ABNORMAL HIGH
Troponin-I: 0.12 ng/mL — ABNORMAL HIGH

## 2014-03-28 LAB — PRO B NATRIURETIC PEPTIDE: B-TYPE NATIURETIC PEPTID: 83993 pg/mL — AB (ref 0–125)

## 2014-03-29 ENCOUNTER — Telehealth: Payer: Self-pay | Admitting: *Deleted

## 2014-03-29 ENCOUNTER — Encounter: Payer: Self-pay | Admitting: *Deleted

## 2014-03-29 DIAGNOSIS — I739 Peripheral vascular disease, unspecified: Secondary | ICD-10-CM

## 2014-03-29 LAB — BASIC METABOLIC PANEL
Anion Gap: 7 (ref 7–16)
BUN: 9 mg/dL (ref 7–18)
CALCIUM: 7.9 mg/dL — AB (ref 8.5–10.1)
CREATININE: 0.85 mg/dL (ref 0.60–1.30)
Chloride: 101 mmol/L (ref 98–107)
Co2: 34 mmol/L — ABNORMAL HIGH (ref 21–32)
EGFR (Non-African Amer.): >60
Glucose: 94 mg/dL (ref 65–99)
Osmolality: 282 (ref 275–301)
Potassium: 3 mmol/L — ABNORMAL LOW (ref 3.5–5.1)
Sodium: 142 mmol/L (ref 136–145)

## 2014-03-29 LAB — CBC WITH DIFFERENTIAL/PLATELET
BASOS PCT: 0.8 %
Basophil #: 0 10*3/uL (ref 0.0–0.1)
Eosinophil #: 0.1 10*3/uL (ref 0.0–0.7)
Eosinophil %: 0.9 %
HCT: 33.1 % — ABNORMAL LOW (ref 35.0–47.0)
HGB: 10.1 g/dL — ABNORMAL LOW (ref 12.0–16.0)
LYMPHS PCT: 24 %
Lymphocyte #: 1.6 10*3/uL (ref 1.0–3.6)
MCH: 23.8 pg — AB (ref 26.0–34.0)
MCHC: 30.4 g/dL — AB (ref 32.0–36.0)
MCV: 78 fL — AB (ref 80–100)
Monocyte #: 0.5 x10 3/mm (ref 0.2–0.9)
Monocyte %: 7.8 %
NEUTROS PCT: 66.5 %
Neutrophil #: 4.3 10*3/uL (ref 1.4–6.5)
Platelet: 337 10*3/uL (ref 150–440)
RBC: 4.23 10*6/uL (ref 3.80–5.20)
RDW: 19.4 % — ABNORMAL HIGH (ref 11.5–14.5)
WBC: 6.5 10*3/uL (ref 3.6–11.0)

## 2014-03-29 LAB — LIPID PANEL
Cholesterol: 105 mg/dL (ref 0–200)
HDL Cholesterol: 26 mg/dL — ABNORMAL LOW (ref 40–60)
LDL CHOLESTEROL, CALC: 55 mg/dL (ref 0–100)
Triglycerides: 119 mg/dL (ref 0–200)
VLDL Cholesterol, Calc: 24 mg/dL (ref 5–40)

## 2014-03-29 LAB — TSH: Thyroid Stimulating Horm: 0.393 u[IU]/mL — ABNORMAL LOW

## 2014-03-29 LAB — MAGNESIUM: MAGNESIUM: 1.8 mg/dL

## 2014-03-29 NOTE — Telephone Encounter (Signed)
-----   Message from Wellington Hampshire, MD sent at 03/25/2014  1:41 PM EST ----- Schedule her for Abdominal aortogram with run off and possible angioplasty at [facility].

## 2014-03-29 NOTE — Telephone Encounter (Signed)
Patient is in the hospital with pneumonia Reviewed procedure and lab date, time and instructions with patients sister  She verbalized understanding  She stated that she will call if procedure needs to be rescheduled  Informed patients sister that copy of instructions at the front desk for pick up  Will follow up with patient next week to review instructions

## 2014-03-30 LAB — BASIC METABOLIC PANEL
Anion Gap: 4 — ABNORMAL LOW (ref 7–16)
BUN: 13 mg/dL (ref 7–18)
CO2: 37 mmol/L — AB (ref 21–32)
Calcium, Total: 8.1 mg/dL — ABNORMAL LOW (ref 8.5–10.1)
Chloride: 99 mmol/L (ref 98–107)
Creatinine: 0.8 mg/dL (ref 0.60–1.30)
EGFR (African American): >60
Glucose: 90 mg/dL (ref 65–99)
OSMOLALITY: 279 (ref 275–301)
Potassium: 3.8 mmol/L (ref 3.5–5.1)
Sodium: 140 mmol/L (ref 136–145)

## 2014-03-30 LAB — T4, FREE: Free Thyroxine: 1.13 ng/dL (ref 0.76–1.46)

## 2014-03-31 LAB — CBC WITH DIFFERENTIAL/PLATELET
Basophil #: 0 10*3/uL (ref 0.0–0.1)
Basophil %: 0.8 %
EOS PCT: 2.6 %
Eosinophil #: 0.2 10*3/uL (ref 0.0–0.7)
HCT: 34 % — AB (ref 35.0–47.0)
HGB: 10.4 g/dL — AB (ref 12.0–16.0)
LYMPHS PCT: 23.4 %
Lymphocyte #: 1.4 10*3/uL (ref 1.0–3.6)
MCH: 23.9 pg — ABNORMAL LOW (ref 26.0–34.0)
MCHC: 30.5 g/dL — ABNORMAL LOW (ref 32.0–36.0)
MCV: 78 fL — ABNORMAL LOW (ref 80–100)
Monocyte #: 0.5 x10 3/mm (ref 0.2–0.9)
Monocyte %: 7.7 %
NEUTROS ABS: 4 10*3/uL (ref 1.4–6.5)
Neutrophil %: 65.5 %
PLATELETS: 315 10*3/uL (ref 150–440)
RBC: 4.34 10*6/uL (ref 3.80–5.20)
RDW: 19.5 % — ABNORMAL HIGH (ref 11.5–14.5)
WBC: 6.1 10*3/uL (ref 3.6–11.0)

## 2014-03-31 LAB — BASIC METABOLIC PANEL
Anion Gap: 7 (ref 7–16)
BUN: 15 mg/dL (ref 7–18)
CHLORIDE: 94 mmol/L — AB (ref 98–107)
Calcium, Total: 7.5 mg/dL — ABNORMAL LOW (ref 8.5–10.1)
Co2: 36 mmol/L — ABNORMAL HIGH (ref 21–32)
Creatinine: 1.05 mg/dL (ref 0.60–1.30)
EGFR (African American): >60
GFR CALC NON AF AMER: 56 — AB
Glucose: 88 mg/dL (ref 65–99)
OSMOLALITY: 274 (ref 275–301)
POTASSIUM: 3 mmol/L — AB (ref 3.5–5.1)
Sodium: 137 mmol/L (ref 136–145)

## 2014-04-01 LAB — BASIC METABOLIC PANEL
ANION GAP: 2 — AB (ref 7–16)
BUN: 12 mg/dL (ref 7–18)
CALCIUM: 8 mg/dL — AB (ref 8.5–10.1)
CHLORIDE: 98 mmol/L (ref 98–107)
CO2: 37 mmol/L — AB (ref 21–32)
Creatinine: 0.9 mg/dL (ref 0.60–1.30)
EGFR (Non-African Amer.): >60
Glucose: 115 mg/dL — ABNORMAL HIGH (ref 65–99)
Osmolality: 274 (ref 275–301)
Potassium: 3.5 mmol/L (ref 3.5–5.1)
SODIUM: 137 mmol/L (ref 136–145)

## 2014-04-02 LAB — BASIC METABOLIC PANEL
Anion Gap: 4 — ABNORMAL LOW (ref 7–16)
BUN: 10 mg/dL (ref 7–18)
CALCIUM: 8.8 mg/dL (ref 8.5–10.1)
CHLORIDE: 93 mmol/L — AB (ref 98–107)
CO2: 36 mmol/L — AB (ref 21–32)
Creatinine: 0.77 mg/dL (ref 0.60–1.30)
EGFR (African American): >60
Glucose: 92 mg/dL (ref 65–99)
OSMOLALITY: 265 (ref 275–301)
Potassium: 3.6 mmol/L (ref 3.5–5.1)
Sodium: 133 mmol/L — ABNORMAL LOW (ref 136–145)

## 2014-04-04 ENCOUNTER — Other Ambulatory Visit (INDEPENDENT_AMBULATORY_CARE_PROVIDER_SITE_OTHER): Payer: Medicare Other

## 2014-04-04 ENCOUNTER — Other Ambulatory Visit: Payer: Medicare Other

## 2014-04-04 DIAGNOSIS — I739 Peripheral vascular disease, unspecified: Secondary | ICD-10-CM

## 2014-04-05 HISTORY — PX: CARDIAC CATHETERIZATION: SHX172

## 2014-04-05 LAB — BASIC METABOLIC PANEL
BUN / CREAT RATIO: 16 (ref 11–26)
BUN: 12 mg/dL (ref 8–27)
CHLORIDE: 96 mmol/L — AB (ref 97–108)
CO2: 22 mmol/L (ref 18–29)
Calcium: 8.9 mg/dL (ref 8.7–10.3)
Creatinine, Ser: 0.75 mg/dL (ref 0.57–1.00)
GFR calc Af Amer: 97 mL/min/{1.73_m2} (ref >59)
GFR calc non Af Amer: 85 mL/min/{1.73_m2} (ref >59)
GLUCOSE: 90 mg/dL (ref 65–99)
Potassium: 5 mmol/L (ref 3.5–5.2)
Sodium: 136 mmol/L (ref 134–144)

## 2014-04-05 LAB — CBC WITH DIFFERENTIAL
BASOS ABS: 0 10*3/uL (ref 0.0–0.2)
Basos: 0 %
EOS ABS: 0.2 10*3/uL (ref 0.0–0.4)
Eos: 2 %
HEMATOCRIT: 34.7 % (ref 34.0–46.6)
Hemoglobin: 10.6 g/dL — ABNORMAL LOW (ref 11.1–15.9)
IMMATURE GRANULOCYTES: 0 %
Immature Grans (Abs): 0 10*3/uL (ref 0.0–0.1)
LYMPHS: 29 %
Lymphocytes Absolute: 2.3 10*3/uL (ref 0.7–3.1)
MCH: 23.3 pg — ABNORMAL LOW (ref 26.6–33.0)
MCHC: 30.5 g/dL — AB (ref 31.5–35.7)
MCV: 76 fL — ABNORMAL LOW (ref 79–97)
MONOCYTES: 10 %
Monocytes Absolute: 0.8 10*3/uL (ref 0.1–0.9)
NEUTROS ABS: 4.6 10*3/uL (ref 1.4–7.0)
NEUTROS PCT: 59 %
PLATELETS: 413 10*3/uL — AB (ref 150–379)
RBC: 4.55 x10E6/uL (ref 3.77–5.28)
RDW: 18.6 % — AB (ref 12.3–15.4)
WBC: 7.8 10*3/uL (ref 3.4–10.8)

## 2014-04-05 LAB — PROTIME-INR
INR: 1 (ref 0.8–1.2)
Prothrombin Time: 10.7 s (ref 9.1–12.0)

## 2014-04-06 ENCOUNTER — Encounter (HOSPITAL_COMMUNITY)
Admission: RE | Disposition: A | Discharge status: Home Health | Payer: Medicare Other | Source: Ambulatory Visit | Attending: Cardiovascular Disease

## 2014-04-06 ENCOUNTER — Inpatient Hospital Stay (HOSPITAL_COMMUNITY)
Admission: RE | Admit: 2014-04-06 | Discharge: 2014-04-10 | DRG: 253 | Disposition: A | Discharge status: Home Health | Payer: Medicare Other | Source: Ambulatory Visit | Attending: Cardiovascular Disease | Admitting: Cardiovascular Disease

## 2014-04-06 ENCOUNTER — Encounter (HOSPITAL_COMMUNITY): Payer: Self-pay | Admitting: General Practice

## 2014-04-06 ENCOUNTER — Other Ambulatory Visit (HOSPITAL_COMMUNITY): Payer: Self-pay | Admitting: *Deleted

## 2014-04-06 ENCOUNTER — Encounter (HOSPITAL_COMMUNITY): Payer: Self-pay | Admitting: Pharmacy Technician

## 2014-04-06 ENCOUNTER — Other Ambulatory Visit: Payer: Self-pay | Admitting: Cardiovascular Disease

## 2014-04-06 DIAGNOSIS — R1310 Dysphagia, unspecified: Secondary | ICD-10-CM | POA: Diagnosis present

## 2014-04-06 DIAGNOSIS — I708 Atherosclerosis of other arteries: Secondary | ICD-10-CM | POA: Diagnosis present

## 2014-04-06 DIAGNOSIS — S301XXA Contusion of abdominal wall, initial encounter: Secondary | ICD-10-CM | POA: Diagnosis present

## 2014-04-06 DIAGNOSIS — I739 Peripheral vascular disease, unspecified: Secondary | ICD-10-CM

## 2014-04-06 DIAGNOSIS — D649 Anemia, unspecified: Secondary | ICD-10-CM | POA: Diagnosis present

## 2014-04-06 DIAGNOSIS — I5022 Chronic systolic (congestive) heart failure: Secondary | ICD-10-CM | POA: Diagnosis present

## 2014-04-06 DIAGNOSIS — I10 Essential (primary) hypertension: Secondary | ICD-10-CM | POA: Diagnosis present

## 2014-04-06 DIAGNOSIS — I959 Hypotension, unspecified: Secondary | ICD-10-CM | POA: Diagnosis not present

## 2014-04-06 DIAGNOSIS — Z79899 Other long term (current) drug therapy: Secondary | ICD-10-CM

## 2014-04-06 DIAGNOSIS — M7989 Other specified soft tissue disorders: Secondary | ICD-10-CM

## 2014-04-06 DIAGNOSIS — T148XXA Other injury of unspecified body region, initial encounter: Secondary | ICD-10-CM

## 2014-04-06 DIAGNOSIS — Z720 Tobacco use: Secondary | ICD-10-CM | POA: Diagnosis present

## 2014-04-06 DIAGNOSIS — F17210 Nicotine dependence, cigarettes, uncomplicated: Secondary | ICD-10-CM | POA: Diagnosis present

## 2014-04-06 DIAGNOSIS — Z79891 Long term (current) use of opiate analgesic: Secondary | ICD-10-CM

## 2014-04-06 DIAGNOSIS — I701 Atherosclerosis of renal artery: Secondary | ICD-10-CM | POA: Diagnosis present

## 2014-04-06 DIAGNOSIS — I429 Cardiomyopathy, unspecified: Secondary | ICD-10-CM | POA: Diagnosis present

## 2014-04-06 DIAGNOSIS — L7622 Postprocedural hemorrhage and hematoma of skin and subcutaneous tissue following other procedure: Secondary | ICD-10-CM | POA: Diagnosis not present

## 2014-04-06 DIAGNOSIS — I70213 Atherosclerosis of native arteries of extremities with intermittent claudication, bilateral legs: Secondary | ICD-10-CM

## 2014-04-06 HISTORY — DX: Personal history of other medical treatment: Z92.89

## 2014-04-06 HISTORY — DX: Pneumonia, unspecified organism: J18.9

## 2014-04-06 HISTORY — PX: ILIAC ARTERY STENT: SHX1786

## 2014-04-06 HISTORY — DX: Peripheral vascular disease, unspecified: I73.9

## 2014-04-06 HISTORY — DX: Dependence on supplemental oxygen: Z99.81

## 2014-04-06 HISTORY — PX: ABDOMINAL AORTAGRAM: SHX5454

## 2014-04-06 HISTORY — DX: Unspecified osteoarthritis, unspecified site: M19.90

## 2014-04-06 LAB — POCT I-STAT, CHEM 8
BUN: 6 mg/dL (ref 6–23)
Calcium, Ion: 1.09 mmol/L — ABNORMAL LOW (ref 1.13–1.30)
Chloride: 114 mEq/L — ABNORMAL HIGH (ref 96–112)
Creatinine, Ser: 0.6 mg/dL (ref 0.50–1.10)
Glucose, Bld: 110 mg/dL — ABNORMAL HIGH (ref 70–99)
HCT: 32 % — ABNORMAL LOW (ref 36.0–46.0)
HEMOGLOBIN: 10.9 g/dL — AB (ref 12.0–15.0)
Potassium: 3.7 mEq/L (ref 3.7–5.3)
Sodium: 132 mEq/L — ABNORMAL LOW (ref 137–147)
TCO2: 22 mmol/L (ref 0–100)

## 2014-04-06 LAB — CBC
HCT: 29.5 % — ABNORMAL LOW (ref 36.0–46.0)
HEMOGLOBIN: 8.7 g/dL — AB (ref 12.0–15.0)
MCH: 22.9 pg — ABNORMAL LOW (ref 26.0–34.0)
MCHC: 29.5 g/dL — ABNORMAL LOW (ref 30.0–36.0)
MCV: 77.6 fL — ABNORMAL LOW (ref 78.0–100.0)
PLATELETS: 417 10*3/uL — AB (ref 150–400)
RBC: 3.8 MIL/uL — ABNORMAL LOW (ref 3.87–5.11)
RDW: 18.7 % — ABNORMAL HIGH (ref 11.5–15.5)
WBC: 8.6 10*3/uL (ref 4.0–10.5)

## 2014-04-06 LAB — POCT ACTIVATED CLOTTING TIME
ACTIVATED CLOTTING TIME: 202 s
ACTIVATED CLOTTING TIME: 183 s
Activated Clotting Time: 196 seconds

## 2014-04-06 LAB — HEMOGLOBIN AND HEMATOCRIT, BLOOD
HEMATOCRIT: 29.8 % — AB (ref 36.0–46.0)
Hemoglobin: 8.7 g/dL — ABNORMAL LOW (ref 12.0–15.0)

## 2014-04-06 SURGERY — ABDOMINAL AORTAGRAM
Anesthesia: LOCAL

## 2014-04-06 MED ORDER — CLOPIDOGREL BISULFATE 75 MG PO TABS
ORAL_TABLET | ORAL | Status: AC
  Filled 2014-04-06: qty 4

## 2014-04-06 MED ORDER — FENTANYL CITRATE 0.05 MG/ML IJ SOLN
INTRAMUSCULAR | Status: AC
  Administered 2014-04-06: 50 ug via INTRAVENOUS
  Filled 2014-04-06: qty 2

## 2014-04-06 MED ORDER — SODIUM CHLORIDE 0.9 % IV SOLN
INTRAVENOUS | Status: DC
  Administered 2014-04-06: 07:00:00 via INTRAVENOUS

## 2014-04-06 MED ORDER — FENTANYL CITRATE 0.05 MG/ML IJ SOLN
INTRAMUSCULAR | Status: AC
  Filled 2014-04-06: qty 2

## 2014-04-06 MED ORDER — ASPIRIN 81 MG PO CHEW
81.0000 mg | CHEWABLE_TABLET | Freq: Every day | ORAL | Status: DC
  Filled 2014-04-06: qty 1

## 2014-04-06 MED ORDER — ASPIRIN 81 MG PO CHEW: 81.0000 mg | CHEWABLE_TABLET | Freq: Once | ORAL | Status: DC

## 2014-04-06 MED ORDER — FENTANYL CITRATE 0.05 MG/ML IJ SOLN
50.0000 ug | INTRAMUSCULAR | Status: DC | PRN
  Administered 2014-04-06 – 2014-04-07 (×7): 50 ug via INTRAVENOUS
  Filled 2014-04-06 (×8): qty 2

## 2014-04-06 MED ORDER — SODIUM CHLORIDE 0.9 % IV SOLN: 250.0000 mL | INTRAVENOUS | Status: DC | PRN

## 2014-04-06 MED ORDER — SODIUM CHLORIDE 0.9 % IJ SOLN: 3.0000 mL | Freq: Two times a day (BID) | INTRAMUSCULAR | Status: DC

## 2014-04-06 MED ORDER — FAMOTIDINE IN NACL 20-0.9 MG/50ML-% IV SOLN
INTRAVENOUS | Status: AC
  Filled 2014-04-06: qty 50

## 2014-04-06 MED ORDER — HEPARIN (PORCINE) IN NACL 2-0.9 UNIT/ML-% IJ SOLN
INTRAMUSCULAR | Status: AC
  Filled 2014-04-06: qty 1000

## 2014-04-06 MED ORDER — LIDOCAINE HCL (PF) 1 % IJ SOLN
INTRAMUSCULAR | Status: AC
  Filled 2014-04-06: qty 30

## 2014-04-06 MED ORDER — MIDAZOLAM HCL 2 MG/2ML IJ SOLN
INTRAMUSCULAR | Status: AC
  Filled 2014-04-06: qty 2

## 2014-04-06 MED ORDER — FENTANYL CITRATE 0.05 MG/ML IJ SOLN
50.0000 ug | INTRAMUSCULAR | Status: DC | PRN
Start: 2014-04-06 — End: 2014-04-06
  Administered 2014-04-06: 50 ug via INTRAVENOUS

## 2014-04-06 MED ORDER — SODIUM CHLORIDE 0.9 % IJ SOLN: 3.0000 mL | INTRAMUSCULAR | Status: DC | PRN

## 2014-04-06 MED ORDER — SODIUM CHLORIDE 0.9 % IV SOLN: INTRAVENOUS | Status: AC

## 2014-04-06 MED ORDER — CLOPIDOGREL BISULFATE 75 MG PO TABS: 75.0000 mg | ORAL_TABLET | Freq: Every day | ORAL | Status: DC

## 2014-04-06 MED ORDER — HEPARIN SODIUM (PORCINE) 1000 UNIT/ML IJ SOLN
INTRAMUSCULAR | Status: AC
  Filled 2014-04-06: qty 1

## 2014-04-06 NOTE — Progress Notes (Signed)
Pt is speaking calmly and in no apparent distress. C/O severe back pain  9/10.

## 2014-04-06 NOTE — Progress Notes (Signed)
Femstop applied with 45mm/Hg of pressure.  Right DP dopplered.  Dr Fletcher Anon in to see pt.  Right groin  pain level at this time was is level 8 out of ten

## 2014-04-06 NOTE — Progress Notes (Signed)
Dr Fletcher Anon in to see pt and observed right groin hematoma .  Orders given to given pain med and continue to hold pressure and use a Fem Stop if needed.

## 2014-04-06 NOTE — Progress Notes (Signed)
Pt is sleeping. Will defer pain medication at this time.

## 2014-04-06 NOTE — Discharge Instructions (Signed)
Start Plavix 75 mg once daily. [facility] prescription [facility]. Take Aspirin 81 mg once daily.

## 2014-04-06 NOTE — Progress Notes (Signed)
Dr Fletcher Anon in to see pt prior to tx to 6C06. VO given to remove femostop 1 hr post arrival 6C, CBC 2h post.

## 2014-04-06 NOTE — Care Management Note (Addendum)
Page 1 of 2   04/10/2014     1:25:15 PM CARE MANAGEMENT NOTE 04/10/2014  Patient:  [name], [name]   Account Number:  [id number]  Date Initiated:  04/06/2014  Documentation initiated by:  HUTCHINSON,CRYSTAL  Subjective/Objective Assessment:   PVD  Procedure(s):  ABDOMINAL AORTAGRAM (N/A) as a surgical intervention .     Action/Plan:   CM to follow for disposition needs   Anticipated DC Date:  04/07/2014   Anticipated DC Plan:  [place]  CM consult  Medication Assistance  Other      [facility] Choice  HOME HEALTH   Choice offered to / List presented to:  C-1 Patient   DME arranged  NA      DME agency  NA     [place] arranged  HH-1 RN  [place]      [place].   Status of service:  Completed, signed off Medicare Important Message given?  YES (If response is "NO", the following Medicare IM given date fields will be blank) Date Medicare IM given:  04/10/2014 Medicare IM given by:  [facility] Date Additional Medicare IM given:   Additional Medicare IM given by:    Discharge Disposition:  [place]  Per UR Regulation:    If discussed at Long Length of Stay Meetings, dates discussed:    Comments:  04/10/14 13:20 Cm received call from RN as pt refusing SNF and is going home with HHPT/RN/SW.  CM met with pt in room to offer choice of home health agency.  Pt chooses [facility] to render [facility] services.  address and contact information verified with pt.  Referral called to [facility] rep, Lecretia. No other CM needs were communicated.  [name], BSN, CM [phone].  Crystal Hutchinson RN, BSN, MSHL, CCM  Nurse - Case Manager,  (Unit [phone]  04/07/2014 CM Consult Request: problems paying for RX; problems picking them up; poor support system; got O2 Sat; don't know how to use it Reason for consult:->Medication needs Reason for  consult:->Equipment CM met with patient.  Hx/o home alone; DTR is a single mom and lives 40 miles away.  Sister but unable to provide asssitance.  Patient states she has no support system and has been too sick to place calls to MD or self manage her ADLs or health care mgmt needs. HHS:  States active with HHS:  [facility] and was ordered home oxygen last d/c home but does not understand how to hook up her oxygen or O2 sats. MEDS:  states some meds cost 2-3.00 but others are not covered at all and very expensive.  Med coverage through [facility]. Pharmacy:  using [facility] 2 blocks from home. States able to get there sometimes but not always.  Patient states her [facility] requires her to use [facility]. Transportation:  Self - drives when feeling able. STG:  PT Eval needed to assess need for SNF at this time. - hx/o admission to [facility]) and d/c 11/28.  Hx/o SNF admission x 3 month duration over the past year. LTG:  HHS:  SW for [employer], pharmacy delivery / Administrator; Therapist, sports,  Additional O2 MGMT ED Dispo Plan:  pending.      Crystal Hutchinson RN, BSN, MSHL, CCM  Nurse - Case Manager,  (Unit [phone]  04/06/2014 Specialty Med  review:  Plavix Dispo Plan:  Home / Self care

## 2014-04-06 NOTE — Progress Notes (Signed)
Dr Fletcher Anon paged and order given for [facility] 43mcg q 1 hours as needed for severe pain. Will give when time.

## 2014-04-06 NOTE — Progress Notes (Signed)
Relieved by Mickel Baas after 80 min pressure to rt groin. Growth noted with pressure removal .

## 2014-04-06 NOTE — H&P (View-Only) (Signed)
Patient ID: [name], female    DOB: [birth year], 63 y.o.   MRN: [id number]  HPI Comments: 63 year old woman with history of nonischemic cardiopathy, ejection fraction 20-25% presenting to the hospital 02/08/2014 with acute on chronic systolic CHF, treated with diuresis  Follow-up stress testing showing  mild fixed defect noted in the inferior wall on nonattenuation corrected images. Attenuation corrected images shows small mild perfusion defect in the apical and distal anterior wall with possible small mild ischemia  She had cardiac catheterization that showed no significant CAD She presents today for follow-up after recent cardiac catheterization  Cardiac catheterization showed no significant coronary disease. Severely depressed ejection fraction, global hypokinesis She had severe right common iliac arterial disease, significant disease of the internal iliac and right common femoral artery. She has claudication symptoms on a regular basis on the right, also has pain in her right buttock  She reports having periodic episodes of shortness of breath. She takes extra Lasix, and eventually her symptoms resolve. Denies having significant lower extremity edema. Not very active at baseline. No significant cough, no orthostasis, no chest pain Right groin has healed, still with a small knot  Cardiac catheterization results again reviewed with her from 03/17/2014 showing no significant CAD, ejection fraction 20%, PAD discussed with her EKG shows normal sinus rhythm with rate 89 bpm, nonspecific ST abnormality in the anterolateral leads    Other past medical history . NICM (nonischemic cardiomyopathy)    a. 2008 Echo: EF 20% [facility]);  b. 2011 Echo: EF 50-55% ([facility]);  c. 02/2014 Echo: EF 20-25%, mod dil LA/RA, severe MR, mod-sev TR. [name] Chronic systolic CHF (congestive heart failure)    a. 02/2014 Echo: EF 20-25%, severe MR, tricuspid regurg, mod dilated LA & RA . HTN  (hypertension)  . Hyperlipidemia  . Tobacco abuse  . Hypothyroidism    a. 02/2014 TSH 96.4. . Spinal stenosis  . Chronic back pain  . PUD (peptic ulcer disease)  . History of nuclear stress test    a. 02/24/2014: Lexiscan Myoview: no sig ischemia, predominantly mild fixed defect noted in the inf wall on nonattenuation corrected images. On attenuation corrected images small mild perfusion defect in the apical & distal anterior wall w/ possible small mild ischemia. Mod global HK. No EKG changes concerning for ischemia. EF 35%. No artificat noted. Overall, moderate risk study.        Social history  reports that she has been smoking Cigarettes.  She has a 20 pack-year smoking history. She does not have any smokeless tobacco history on file. She reports that she does not drink alcohol or use illicit drugs.    Outpatient Encounter Prescriptions as of 03/24/2014  Medication Sig  . carvedilol (COREG) 12.5 MG tablet Take 1 tablet (12.5 mg total) by mouth 2 (two) times daily.  . chlorpheniramine-HYDROcodone (TUSSIONEX PENNKINETIC ER) 10-8 MG/5ML LQCR Take 5 mLs by mouth every 12 (twelve) hours as needed for cough.  . enalapril (VASOTEC) 5 MG tablet Take 5 mg by mouth 2 (two) times daily.  [name] escitalopram (LEXAPRO) 5 MG tablet Take 5 mg by mouth daily.  . furosemide (LASIX) 40 MG tablet Take 1 tablet (40 mg total) by mouth 2 (two) times daily as needed.  . gabapentin (NEURONTIN) 300 MG capsule Take 300 mg by mouth 3 (three) times daily.  [name] HYDROcodone-acetaminophen (NORCO) 10-325 MG per tablet Take 1 tablet by mouth every 6 (six) hours as needed.  [name] levothyroxine (SYNTHROID, LEVOTHROID) 125 MCG tablet Take 125 mcg  by mouth daily before breakfast.  . spironolactone (ALDACTONE) 25 MG tablet Take 12.5 mg by mouth daily.   . [DISCONTINUED] carvedilol (COREG) 6.25 MG tablet Take 1 tablet (6.25 mg total) by mouth 2 (two) times daily.  . [DISCONTINUED] furosemide (LASIX) 40 MG tablet Take 40 mg by mouth  daily.    Review of Systems  Constitutional: Negative.   Respiratory: Positive for shortness of breath.   Cardiovascular: Negative.   Gastrointestinal: Negative.   Musculoskeletal: Negative.        Claudication symptoms on the right  Skin: Negative.   Neurological: Negative.   Hematological: Negative.   All other systems reviewed and are negative.   BP 120/60 mmHg  Pulse 89  Ht 5\' 5"  (1.651 m)  Wt 131 lb 4 oz (59.535 kg)  BMI 21.84 kg/m2  Physical Exam  Constitutional: She is oriented to person, place, and time. She appears well-developed and well-nourished.  HENT:  Head: Normocephalic.  Nose: Nose normal.  Mouth/Throat: Oropharynx is clear and moist.  Eyes: Conjunctivae are normal. Pupils are equal, round, and reactive to light.  Neck: Normal range of motion. Neck supple. No JVD present.  Cardiovascular: Normal rate, regular rhythm, S1 normal, S2 normal, normal heart sounds and intact distal pulses.  Exam reveals no gallop and no friction rub.   No murmur heard. Pulmonary/Chest: Effort normal and breath sounds normal. No respiratory distress. She has no wheezes. She has no rales. She exhibits no tenderness.  Abdominal: Soft. Bowel sounds are normal. She exhibits no distension. There is no tenderness.  Musculoskeletal: Normal range of motion. She exhibits no edema or tenderness.  Lymphadenopathy:    She has no cervical adenopathy.  Neurological: She is alert and oriented to person, place, and time. Coordination normal.  Skin: Skin is warm and dry. No rash noted. No erythema.  Psychiatric: She has a normal mood and affect. Her behavior is normal. Judgment and thought content normal.    Assessment and Plan  Nursing note and vitals reviewed.

## 2014-04-06 NOTE — Interval H&P Note (Signed)
History and Physical Interval Note:  04/06/2014 8:46 AM  [name]  has presented today for surgery, with the diagnosis of pvd  The various methods of treatment have been discussed with the patient and family. After consideration of risks, benefits and other options for treatment, the patient has consented to  Procedure(s): ABDOMINAL AORTAGRAM (N/A) as a surgical intervention .  The patient's history has been reviewed, patient examined, no change in status, stable for surgery.  I have reviewed the patient's chart and labs.  Questions were answered to the patient's satisfaction.     Kathlyn Sacramento

## 2014-04-06 NOTE — CV Procedure (Signed)
PERIPHERAL VASCULAR PROCEDURE  NAME:  [name]   MRN: [id number] DOB:  07/25/1950   ADMIT DATE: 04/06/2014  Performing Cardiologist: Kathlyn Sacramento Primary Physician: Golden Pop, MD Primary Cardiologist:  Dr. Rockey Situ  Procedures Performed:  Abdominal Aortic Angiogram with Bi-Iliofemoral Runoff  Bilateral Lower Extremity Angiography (1st Order)  Left external iliac artery self-expanding stent placement  Right common iliac artery self-expanding stent placement    Indication(s):   Claudication    Consent: The procedure with Risks/Benefits/Alternatives and Indications was reviewed with the patient .  All questions were answered.  Medications:  Sedation:  1 mg IV Versed, 100 mcg IV Fentanyl  Contrast:  139 ml  Visipaque   Procedural details: The right groin was prepped, draped, and anesthetized with 1% lidocaine. Using modified Seldinger technique, a 4 micropuncture [place] sheath was introduced into the right common femoral artery. This was exchanged into a 5 [place] sheath.  A 5 Fr Short Pigtail Catheter was advanced of over a  Versicore wire into the descending Aorta to a level just above the renal arteries. A power injection of 76ml/sec contrast over 1 sec was performed for Abdominal Aortic Angiography.  The catheter was then pulled back to a level just above the Aortic bifurcation, and a second power injection was performed to evaluate the iliac arteries.   Bilateral lower extremity arterial run off was then performed via power injection of 7 ml / sec contrast for a total of 77 ml.  Interventional Procedure:  The pigtail catheter was changed over the Versicore wire for A crossover catheter which was then pulled back the aortic bifurcation and the wire was advanced down the contralateral common iliac artery.  The wire was then advanced to the contralateral common femoral artery, the catheter was exchanged into a 6 [place] destination sheath.  I then placed an 8 x  30 mm Smart self-expanding stent in the distal left external iliac artery. This was postdilated with a 7 mm balloon. Angiography showed excellent results with 0% residual stenosis. The sheath was then pulled back to the right external iliac artery. The lesion in the common femoral iliac artery was direct stented with a 9 x 40 mm Smart self-expanding stent which was postdilated with an 8 mm balloon with excellent results and 0% residual stenosis. There was moderate 40 to 50% disease in the right external iliac artery which was left to be treated medically. The patient was given a total of 7000 units of unfractionated heparin to maintain an ACT above 200. The sheath was secured in place to be removed manually.   Hemodynamics:  Central Aortic Pressure / Mean Aortic Pressure: 138/80  Findings:  Abdominal aorta: Normal in size with no evidence of aneurysm. There is mild irregularities.  Left renal artery: 80% ostial stenosis.  Right renal artery: 80% ostial stenosis.  Celiac artery: Patent  Superior mesenteric artery: Patent  Right common iliac artery: Normal in size with 80-90% mid stenosis followed by 50% disease.  Right internal iliac artery: 50% ostial stenosis.  Right external iliac artery: Diffuse 40-50% disease.  Right common femoral artery: There is 80% focal stenosis distally.  Right profunda femoral artery: Normal  Right superficial femoral artery: Diffuse 30-40% disease throughout the vessel with no obstructive disease.  Right popliteal artery: 50% proximal disease above the knee  Three-vessel runoff below the knee with small posterior tibial artery  Left common iliac artery:  Normal in size with 20% diffuse disease  Left internal iliac artery: 40% ostial disease.  Left external iliac artery: 80% distal disease.  Left common femoral artery: 30% stenosis.  Left profunda femoral artery: Normal  Left superficial femoral artery:  The whole vessel is small in diameter  and diffusely diseased with 40% disease starting at the ostium. This is followed by a 90% stenosis proximally and diffuse 50-70% disease throughout the mid and distal segment.  Left popliteal artery: 80% mid stenosis behind the knee.  Three-vessel runoff below the knee. However, the posterior tibial artery appears to be occluded distally.  Conclusions: 1. Significant bilateral renal artery stenosis. No evidence of aortic aneurysm. 2. Significant right common iliac artery stenosis and left external iliac artery stenosis. 3. Diffusely diseased left SFA. 4. Successful self-expanding stent placement to the left external iliac artery and right common iliac arteries.  Recommendations:  Plavix for at least one month. The left SFA disease is not favorable for endovascular intervention given the small diameter and long diffuse disease. Attempted medical therapy is recommended. If unsuccessful, atherectomy can be considered.   Kathlyn Sacramento, MD, [facility] 04/06/2014 10:14 AM

## 2014-04-07 ENCOUNTER — Encounter (HOSPITAL_COMMUNITY): Payer: Self-pay | Admitting: Physician Assistant

## 2014-04-07 DIAGNOSIS — I10 Essential (primary) hypertension: Secondary | ICD-10-CM

## 2014-04-07 DIAGNOSIS — I739 Peripheral vascular disease, unspecified: Secondary | ICD-10-CM | POA: Diagnosis present

## 2014-04-07 DIAGNOSIS — R1310 Dysphagia, unspecified: Secondary | ICD-10-CM

## 2014-04-07 LAB — HEMOGLOBIN AND HEMATOCRIT, BLOOD
HCT: 28.3 % — ABNORMAL LOW (ref 36.0–46.0)
HCT: 31.2 % — ABNORMAL LOW (ref 36.0–46.0)
HCT: 26.1 % — ABNORMAL LOW (ref 36.0–46.0)
HCT: 24.2 % — ABNORMAL LOW (ref 36.0–46.0)
HEMOGLOBIN: 8.2 g/dL — AB (ref 12.0–15.0)
HEMOGLOBIN: 9.4 g/dL — AB (ref 12.0–15.0)
Hemoglobin: 7.9 g/dL — ABNORMAL LOW (ref 12.0–15.0)
Hemoglobin: 7.4 g/dL — ABNORMAL LOW (ref 12.0–15.0)

## 2014-04-07 LAB — ABO/RH: ABO/RH(D): O NEG

## 2014-04-07 MED ORDER — CLOPIDOGREL BISULFATE 75 MG PO TABS
75.0000 mg | ORAL_TABLET | Freq: Every day | ORAL | Status: DC
  Administered 2014-04-07 – 2014-04-10 (×4): 75 mg via ORAL
  Filled 2014-04-07 (×4): qty 1

## 2014-04-07 MED ORDER — HYDROCODONE-ACETAMINOPHEN 10-325 MG PO TABS
1.0000 | ORAL_TABLET | Freq: Four times a day (QID) | ORAL | Status: DC | PRN
  Administered 2014-04-07 – 2014-04-09 (×7): 1 via ORAL
  Filled 2014-04-07 (×9): qty 1

## 2014-04-07 MED ORDER — LEVOTHYROXINE SODIUM 125 MCG PO TABS
125.0000 ug | ORAL_TABLET | Freq: Every day | ORAL | Status: DC
  Administered 2014-04-07 – 2014-04-10 (×4): 125 ug via ORAL
  Filled 2014-04-07 (×7): qty 1

## 2014-04-07 MED ORDER — DIGOXIN 125 MCG PO TABS
0.1250 mg | ORAL_TABLET | Freq: Every day | ORAL | Status: DC
  Administered 2014-04-07 – 2014-04-10 (×4): 0.125 mg via ORAL
  Filled 2014-04-07 (×4): qty 1

## 2014-04-07 MED ORDER — ASPIRIN EC 325 MG PO TBEC: 325.0000 mg | DELAYED_RELEASE_TABLET | Freq: Two times a day (BID) | ORAL | Status: DC

## 2014-04-07 MED ORDER — GABAPENTIN 300 MG PO CAPS
300.0000 mg | ORAL_CAPSULE | Freq: Every day | ORAL | Status: DC
  Administered 2014-04-07 – 2014-04-10 (×4): 300 mg via ORAL
  Filled 2014-04-07 (×5): qty 1

## 2014-04-07 MED ORDER — ENALAPRIL MALEATE 5 MG PO TABS
5.0000 mg | ORAL_TABLET | Freq: Every day | ORAL | Status: DC
  Administered 2014-04-07: 11:00:00 5 mg via ORAL
  Filled 2014-04-07 (×3): qty 1

## 2014-04-07 MED ORDER — SPIRONOLACTONE 12.5 MG HALF TABLET
12.5000 mg | ORAL_TABLET | Freq: Every day | ORAL | Status: DC
  Administered 2014-04-07 – 2014-04-08 (×2): 12.5 mg via ORAL
  Filled 2014-04-07 (×3): qty 1

## 2014-04-07 MED ORDER — ALPRAZOLAM 0.25 MG PO TABS
0.2500 mg | ORAL_TABLET | Freq: Every evening | ORAL | Status: DC | PRN
  Administered 2014-04-07 – 2014-04-09 (×2): 0.25 mg via ORAL
  Filled 2014-04-07 (×2): qty 1

## 2014-04-07 MED ORDER — SODIUM CHLORIDE 0.9 % IV SOLN
Freq: Once | INTRAVENOUS | Status: AC
  Administered 2014-04-07: 15:00:00 via INTRAVENOUS

## 2014-04-07 MED ORDER — ASPIRIN EC 81 MG PO TBEC
81.0000 mg | DELAYED_RELEASE_TABLET | Freq: Every day | ORAL | Status: DC
  Administered 2014-04-07 – 2014-04-10 (×4): 81 mg via ORAL
  Filled 2014-04-07 (×4): qty 1

## 2014-04-07 MED ORDER — CARVEDILOL 12.5 MG PO TABS
12.5000 mg | ORAL_TABLET | Freq: Two times a day (BID) | ORAL | Status: DC
  Administered 2014-04-07: 12.5 mg via ORAL
  Administered 2014-04-08: 09:00:00 6.25 mg via ORAL
  Filled 2014-04-07 (×3): qty 1

## 2014-04-07 MED ORDER — ASPIRIN EC 81 MG PO TBEC: 81.0000 mg | DELAYED_RELEASE_TABLET | Freq: Two times a day (BID) | ORAL | Status: DC

## 2014-04-07 MED FILL — Fentanyl Citrate Inj 0.05 MG/ML: INTRAMUSCULAR | Qty: 2 | Status: AC

## 2014-04-07 NOTE — Progress Notes (Signed)
UR completed 

## 2014-04-07 NOTE — Plan of Care (Signed)
Problem: Phase II Progression Outcomes Goal: Pain controlled with appropriate interventions Outcome: Completed/Met Date Met:  04/07/14 Goal: Hemodynamically stable Outcome: Completed/Met Date Met:  04/07/14 Goal: Tolerates diet Outcome: Completed/Met Date Met:  04/07/14 Goal: Other Discharge Outcomes/Goals Outcome: Not Applicable Date Met:  95/63/87

## 2014-04-07 NOTE — Progress Notes (Addendum)
sbp 60's to 70's, pt claimed no other discomforts except for light headedness , Threasa Beards PA notified ,  iv bolus given per order, antihypertensive med reviewed also with PA , kept on close supervision , v/s monitored

## 2014-04-07 NOTE — Progress Notes (Signed)
Pt's hemoglobin 7.4, Hematocrit 24.2, Dr. Philbert Riser with cardiology informed, lab to be redrawn at 04:00 am, will continue to monitor patient.

## 2014-04-07 NOTE — Progress Notes (Signed)
Patient Name: [name] Date of Encounter: 04/07/2014  Principal Problem:   PAD (peripheral artery disease) Active Problems:   Hematoma of groin   NICM (nonischemic cardiomyopathy)   HTN (hypertension)   Tobacco abuse   Claudication   Primary Cardiologist: Dr. Winfred Burn, MD  Patient Profile: 63 yo female w/ hx NICM, HTN, tob, S-CHF, had ?PAD at cath 11/12. OP PV cath 12/02, PCI L ext iliac & R common iliac.  SUBJECTIVE: R groin extremely sore, no chest pain or SOB.   Problems swallowing pills, most she crushes or lets dissolve in her mouth. Chronic problem, never seen for this. Choked on the Plavix, thinks she eventually got the 4 pills (loading dose) down.  Seen by GI at [facility] 5-6 yr ago for ulcers, no records of this in Care Everywhere.  Chronic anemia, thinks > 8 not so bad for her.   OBJECTIVE Filed Vitals:   04/06/14 2335 04/07/14 0054 04/07/14 0607 04/07/14 0610  BP: 121/63  119/51 119/51  Pulse:   100   Temp:   98.6 F (37 C)   TempSrc:   Oral   Resp:   20   Height:      Weight:  129 lb 10.1 oz (58.8 kg)    SpO2:   95%     Intake/Output Summary (Last 24 hours) at 04/07/14 0703 Last data filed at 04/07/14 0500  Gross per 24 hour  Intake 679.17 ml  Output   1850 ml  Net -1170.83 ml   Filed Weights   04/06/14 0647 04/07/14 0054  Weight: 128 lb (58.06 kg) 129 lb 10.1 oz (58.8 kg)    PHYSICAL EXAM General: Well developed, well nourished, female in no acute distress. Head: Normocephalic, atraumatic.  Neck: Supple without bruits, JVD not elevated. Lungs:  Resp regular and unlabored, few rales, no wheeze. Heart: RRR, S1, S2, no S3, S4, or murmur; no rub. Abdomen: Soft, R groin large hematoma, extremely tender, non-distended, BS + x 4.  Extremities: No clubbing, cyanosis, edema. R groin pulse palpable, no bruit (hard to listen due to pt tenderness), L groin with bruit. Distal pulses palpable but weak. Neuro: Alert and  oriented X 3. Moves all extremities spontaneously. Psych: Normal affect.  LABS: CBC: Recent Labs  04/04/14 1617  04/06/14 2045 04/06/14 2335 04/07/14 0440  WBC 7.8  --  8.6  --   --   NEUTROABS 4.6  --   --   --   --   HGB 10.6*  < > 8.7* 8.7* 8.2*  HCT 34.7  < > 29.5* 29.8* 28.3*  MCV 76*  --  77.6*  --   --   PLT 413*  --  417*  --   --   < > = values in this interval not displayed. INR:  Recent Labs  04/04/14 1617  INR 1.0   Basic Metabolic Panel:  Recent Labs  04/04/14 1617 04/06/14 1333  NA 136 132*  K 5.0 3.7  CL 96* 114*  CO2 22  --   GLUCOSE 90 110*  BUN 12 6  CREATININE 0.75 0.60  CALCIUM 8.9  --    TELE:        Current Medications:  . aspirin EC  81 mg Oral Daily  . carvedilol  12.5 mg Oral BID WC  . clopidogrel  75 mg Oral Daily  . digoxin  0.125 mg Oral Daily  . enalapril  5 mg Oral Daily  . gabapentin  300 mg Oral Daily  . levothyroxine  125 mcg Oral QAC breakfast  . spironolactone  12.5 mg Oral Daily   Peripheral Vascular Cath Conclusions: 1. Significant bilateral renal artery stenosis. No evidence of aortic aneurysm. 2. Significant right common iliac artery stenosis and left external iliac artery stenosis. 3. Diffusely diseased left SFA. 4. Successful self-expanding stent placement to the left external iliac artery and right common iliac arteries. Recommendations:  Plavix for at least one month. The left SFA disease is not favorable for endovascular intervention given the small diameter and long diffuse disease. Attempted medical therapy is recommended. If unsuccessful, atherectomy can be considered.  ASSESSMENT AND PLAN: Principal Problem:   PAD (peripheral artery disease) - decrease home ASA to 81 mg daily. Continue Plavix, increase activity as she can tolerate.   Active Problems:   Hematoma of groin - right groin very tender and sore, pt would not tolerate [place] right now. Since H&H still trending down, will ck CT    HTN  (hypertension) - good control, did not get PM meds, ordered home rx for today.    Tobacco abuse - cessation encouraged, pt thinks she can quit.    Claudication - secondary to PAD    NICM - watch volume status, held Lasix post-cath, no sx overload now.       Pill dysphagia - can crush Plavix, she is willing, says no problems with food. Weight up/down, but feels that has been due to thyroid, feels her weight has stabilized now.   Plan - pt lives alone, need to mobilize, ck CT, follow H&H, d/c when medically stable.   Jonetta Speak , PA-C 7:03 AM 04/07/2014

## 2014-04-07 NOTE — Progress Notes (Signed)
   04/07/14 1850  Clinical Encounter Type  Visited With Patient  Visit Type Spiritual support  Referral From Nurse  Consult/Referral To Chaplain  Spiritual Encounters  Spiritual Needs Grief support;Emotional;Prayer  Stress Factors  Patient Stress Factors Loss;Major life changes  Chaplain responded to page from nurse that pt needed support. Pt found out this evening that her father is in the hospital "and probably won't make it through the night." Pt extremely close to her father and says "I don't know what I'll do without him." Chaplain provided empathic listening, grief support, and prayer. Pt said she felt better after chaplain visit.

## 2014-04-07 NOTE — Progress Notes (Signed)
hgb count 7.9 , relayed to K.thompson PA, will continue to monitor H/H. Pt crying and upset claimed " dad is sick and dying"as per her sister. emotinal comfort,provided, pastoral consult initiated , with med order  for anxiety per K . Grandville Silos PA.[name]

## 2014-04-08 ENCOUNTER — Observation Stay (HOSPITAL_COMMUNITY): Payer: Medicare Other

## 2014-04-08 DIAGNOSIS — I429 Cardiomyopathy, unspecified: Secondary | ICD-10-CM

## 2014-04-08 DIAGNOSIS — I739 Peripheral vascular disease, unspecified: Principal | ICD-10-CM

## 2014-04-08 DIAGNOSIS — I150 Renovascular hypertension: Secondary | ICD-10-CM

## 2014-04-08 DIAGNOSIS — R1310 Dysphagia, unspecified: Secondary | ICD-10-CM

## 2014-04-08 LAB — HEMOGLOBIN AND HEMATOCRIT, BLOOD
HCT: 24.8 % — ABNORMAL LOW (ref 36.0–46.0)
HEMATOCRIT: 25.3 % — AB (ref 36.0–46.0)
HEMATOCRIT: 24.7 % — AB (ref 36.0–46.0)
HEMATOCRIT: 26.2 % — AB (ref 36.0–46.0)
HEMOGLOBIN: 7.4 g/dL — AB (ref 12.0–15.0)
HEMOGLOBIN: 7.7 g/dL — AB (ref 12.0–15.0)
Hemoglobin: 7.4 g/dL — ABNORMAL LOW (ref 12.0–15.0)
Hemoglobin: 7.4 g/dL — ABNORMAL LOW (ref 12.0–15.0)

## 2014-04-08 MED ORDER — CARVEDILOL 6.25 MG PO TABS
6.2500 mg | ORAL_TABLET | Freq: Two times a day (BID) | ORAL | Status: DC
  Administered 2014-04-08 – 2014-04-10 (×4): 6.25 mg via ORAL
  Filled 2014-04-08 (×7): qty 1

## 2014-04-08 NOTE — Evaluation (Signed)
Physical Therapy Evaluation Patient Details Name: [name] MRN: [id number] DOB: June 03, 1950 Today's Date: 04/08/2014   History of Present Illness  Pt is 63 yo female w/ hx NICM, HTN, S-CHF, PAD at cath 11/12. OP PV cath 12/02, PCI L ext iliac & R common iliac. R groin hematoma.  Clinical Impression  Patient up and moving about room on arrival.  While ambulating she stated that she was feeling lightheaded and "wobbly."  BP after ambulation 105/41 (63).  Pt able to complete full treatment without supplemental O2 with only brief instance of SpO2 89%, elevated after pursed lip breathing.  Patient is very anxious about her discharge plan and states that she would like to go home but is very worried that she cannot completely care for herself.  Discussed options with her and rationale for higher level of care (SNF) given her current function.  Patient would benefit from skilled therapy to address deficits in functional mobility and strength in order to decrease burden of care and to care for herself independently.        Follow Up Recommendations SNF    Equipment Recommendations  None recommended by PT    Recommendations for Other Services OT consult     Precautions / Restrictions Precautions Precautions: Fall Restrictions Weight Bearing Restrictions: No      Mobility  Bed Mobility               General bed mobility comments: pt seated EOB on arrival and returned to chair  Transfers Overall transfer level: Needs assistance Equipment used: None Transfers: Sit to/from Stand Sit to Stand: Min guard         General transfer comment: Min guard for patient safety following complaints of weakness  Ambulation/Gait Ambulation/Gait assistance: Min guard Ambulation Distance (Feet): 140 Feet Assistive device: Rolling walker (2 wheeled) Gait Pattern/deviations: Step-through pattern;Decreased stance time - right;Decreased stride length;Antalgic   Gait velocity  interpretation: Below normal speed for age/gender General Gait Details: Pt using furniture in room as supports while walking around.  Ambulated first 57ft with no assistive device but stopping to rest and holding rails on wall.  Ambulated last 61ft with RW; educated for proper use of RW and breathing techniques for increased oxygen perfusion.  Min verbal cues for posture and RW use.  Stairs            Wheelchair Mobility    Modified Rankin (Stroke Patients Only)       Balance Overall balance assessment: Needs assistance Sitting-balance support: No upper extremity supported;Feet supported Sitting balance-Leahy Scale: Good     Standing balance support: No upper extremity supported;During functional activity Standing balance-Leahy Scale: Good                               Pertinent Vitals/Pain Pain Assessment: 0-10 Pain Score: 6  Pain Location: Low back, right hip, right groin Pain Intervention(s): Limited activity within patient's tolerance;Monitored during session;Repositioned  SpO2 seated: 95% (RA) SpO2 after ambulating 74ft: 89% up to 99% after pursed lip breathing BP at rest: 80/53 (61) HR at rest: 82 BP after activity: 105/41 (63)    Home Living Family/patient expects to be discharged to:: Private residence Living Arrangements: Alone Available Help at Discharge: Family Type of Home: Apartment Home Access: Stairs to enter   Technical brewer of Steps: 2 Home Layout: One level Home Equipment: Environmental consultant - 2 wheels;Cane - single point      Prior  Function Level of Independence: Independent with assistive device(s)         Comments: Pt was living alone prior to admission but feels uncomfortable with this situation, feels that she cannot completely care for herself     Hand Dominance        Extremity/Trunk Assessment   Upper Extremity Assessment: Defer to OT evaluation           Lower Extremity Assessment: Generalized weakness;RLE  deficits/detail;LLE deficits/detail RLE Deficits / Details:  R hip flexion 2+/5 LLE Deficits / Details: LLE strength at least 3/5  Cervical / Trunk Assessment: Kyphotic  Communication   Communication: No difficulties  Cognition Arousal/Alertness: Awake/alert Behavior During Therapy: WFL for tasks assessed/performed Overall Cognitive Status: Within Functional Limits for tasks assessed                      General Comments      Exercises        Assessment/Plan    PT Assessment Patient needs continued PT services  PT Diagnosis Difficulty walking;Abnormality of gait;Generalized weakness;Acute pain   PT Problem List Decreased strength;Decreased range of motion;Decreased activity tolerance;Decreased balance;Decreased mobility;Decreased knowledge of use of DME;Pain  PT Treatment Interventions DME instruction;Gait training;Stair training;Functional mobility training;Therapeutic activities;Therapeutic exercise;Balance training;Patient/family education   PT Goals (Current goals can be found in the Care Plan section) Acute Rehab PT Goals Patient Stated Goal: To go home PT Goal Formulation: With patient Time For Goal Achievement: 04/22/14 Potential to Achieve Goals: Good    Frequency Min 3X/week   Barriers to discharge Decreased caregiver support Lives alone with only occasional family support    Co-evaluation               End of Session Equipment Utilized During Treatment: Gait belt Activity Tolerance: Patient tolerated treatment well Patient left: in chair;with call bell/phone within reach Nurse Communication: Mobility status;Other (comment) (D/C plan)    Functional Assessment Tool Used: clinical judgement Functional Limitation: Mobility: Walking and moving around Mobility: Walking and Moving Around Current Status [phone]): At least 20 percent but less than 40 percent impaired, limited or restricted Mobility: Walking and Moving Around Goal Status [phone]): At  least 1 percent but less than 20 percent impaired, limited or restricted    Time: 1336-1401 PT Time Calculation (min) (ACUTE ONLY): 25 min   Charges:   PT Evaluation $Initial PT Evaluation Tier I: 1 Procedure PT Treatments $Therapeutic Activity: 8-22 mins   PT G Codes:   Functional Assessment Tool Used: clinical judgement Functional Limitation: Mobility: Walking and moving around    Joneric Streight SPT 04/08/2014, 2:25 PM

## 2014-04-08 NOTE — Progress Notes (Signed)
Patient Name: [name] [facility] Date of Encounter: 04/08/2014  Primary Cardiologist: Dr. Rockey Situ  Patient Profile:  63 yo female w/ hx NICM, HTN, tob, S-CHF, had ?PAD at cath 11/12. OP PV cath 12/02, PCI L ext iliac & R common iliac.   Principal Problem:   PAD (peripheral artery disease) Active Problems:   NICM (nonischemic cardiomyopathy)   HTN (hypertension)   Tobacco abuse   Claudication   Hematoma of groin   Pill dysphagia    SUBJECTIVE  Denies any CP or SOB. Continue to have R groin tenderness, 4/10 most of the time, constant, however does goes up to 8/10. Continue to have problem swallowing pills, no recent endoscopy per patient.   CURRENT MEDS . aspirin EC  81 mg Oral Daily  . carvedilol  12.5 mg Oral BID WC  . clopidogrel  75 mg Oral Daily  . digoxin  0.125 mg Oral Daily  . enalapril  5 mg Oral Daily  . gabapentin  300 mg Oral Daily  . levothyroxine  125 mcg Oral QAC breakfast  . spironolactone  12.5 mg Oral Daily    OBJECTIVE  Filed Vitals:   04/08/14 0500 04/08/14 0600 04/08/14 0757 04/08/14 0800  BP: 95/40 94/42  111/32  Pulse: 74   83  Temp: 97.9 F (36.6 C)   97.8 F (36.6 C)  TempSrc: Oral   Oral  Resp: 18  18 18   Height:      Weight:      SpO2: 94%  95% 95%    Intake/Output Summary (Last 24 hours) at 04/08/14 0831 Last data filed at 04/08/14 0800  Gross per 24 hour  Intake   1100 ml  Output   1850 ml  Net   -750 ml   Filed Weights   04/06/14 0647 04/07/14 0054 04/08/14 0030  Weight: 128 lb (58.06 kg) 129 lb 10.1 oz (58.8 kg) 130 lb 15.3 oz (59.4 kg)    PHYSICAL EXAM  General: Pleasant, NAD. Frail, weak Neuro: Alert and oriented X 3. Moves all extremities spontaneously. Psych: Normal affect. HEENT:  Normal  Neck: Supple without bruits or JVD. Lungs:  Resp regular and unlabored, CTA. Heart: RRR no s3, s4, or murmurs. Abdomen: Soft, non-tender, non-distended, BS + x 4.  Extremities: R groin subcu ecchymosis, very tender to  touch. No bruit on pulsatile mass on exam .   Accessory Clinical Findings  CBC  Recent Labs  04/06/14 2045  04/07/14 2108 04/08/14 0325  WBC 8.6  --   --   --   HGB 8.7*  < > 7.4* 7.4*  HCT 29.5*  < > 24.2* 25.3*  MCV 77.6*  --   --   --   PLT 417*  --   --   --   < > = values in this interval not displayed. Basic Metabolic Panel  Recent Labs  04/06/14 1333  NA 132*  K 3.7  CL 114*  GLUCOSE 110*  BUN 6  CREATININE 0.60    TELE NSR     ECG  No new EKG   Radiology/Studies  No results found.  ASSESSMENT AND PLAN  1. PAD (peripheral artery disease)   - s/p LE angiography 04/06/2014 significant bilateral renal artery stenosis, significant R CIA stenosis and L EIA stenosis treated with sel expanding stent placedment x 2, diffusely disease L SFA which is not favorable for intervention given small diameter and long diffuse disease (if fail medical treatment, can consider atherectomy later)  -  started having R groin hematoma and ecchymosis after procedure. No bruit or pulsatile mass on exam.   2.  Hematoma of groin - right groin very tender and sore, pt would not tolerate [place] right now.   - hgb flunctuate, however appears baseline hgb between 8-9, now 7.4. Will check with Dr. Harrington Challenger to see if need CT of abdomen and pelvis.   3. HTN (hypertension) - Now hypotensive  - will decrease coreg from 12.5 BID to 6.25 BID for now. Continue spironolactone. Hold ACEI for the morning, then resume tomorrow.  4. Tobacco abuse  5. Claudication - secondary to PAD  6. NICM - watch volume status, held Lasix post-cath, no sx overload now.    - on ACEI and coreg  7. Pill dysphagia - can crush Plavix, she is willing, says no problems with food. Weight up/down, but feels that has been due to thyroid, feels her weight has stabilized now  - seem to have worsened over last month. H/o ulcers. No endoscopy in recent yrs. Feels like pills get stuck. Recommend outpatient GI followup. Consider  add pronotix. Unclear if has recurrent ulcer vs esophageal stricture that's causing the problem  Weston Brass Woodward Ku Pager: [phone] Patient seen and examined  H have reivewed note above by Janan Ridge and agree. R groin is tender / swollen  Cannot stand.  Will get CT to evaluate Agree with decrease in Coreg   Check labs in AM   Repeat CBC  Dorris Carnes

## 2014-04-08 NOTE — Progress Notes (Signed)
Pt's Hematocrit 25.3, hemoglobin 7.4, Dr. Philbert Riser informed, no new orders received.  Pt in no distress, no chest pain, SOB, or dizziness, will continue to monitor patient.

## 2014-04-08 NOTE — Clinical Social Work Psychosocial (Addendum)
CLINICAL SOCIAL WORK ASSESSMENT  Referred by MD on 04/08/14 for SNF placement.  Talked with patient Patient lives alone Family Support: Patient's daughter [name] lives in [place], about 25-3- miles from patient.                           Patient's dad, [name] is 64 years old and is in the hospital also. Patient very concerned about her                           Father, especially since she cannot be with him. Current Concerns: Post Acute Placement Social Work Assessment: CSW met with patient and talked with her about MD/PT recommendation of short-term rehab. Patient has been to rehab before at [facility] and was there 3 months.. CSW provided patient with skilled facility list for [facility] and explained facility search process. Patient informed that she will be provided with facility responses, hopefully over the weekend and after [facility] authorization, will be discharged, probably no later than Monday. Psychosocial Support - Ongoing Assessment of Needs Patient's/Family's Response to Plan of Care:  Patient very pleasant and communicative and in agreement with SNF. She expressed appreciation of CSW's visit.   [name], [place] [phone]

## 2014-04-09 DIAGNOSIS — S301XXD Contusion of abdominal wall, subsequent encounter: Secondary | ICD-10-CM

## 2014-04-09 DIAGNOSIS — R0602 Shortness of breath: Secondary | ICD-10-CM | POA: Diagnosis present

## 2014-04-09 DIAGNOSIS — I5022 Chronic systolic (congestive) heart failure: Secondary | ICD-10-CM | POA: Diagnosis present

## 2014-04-09 DIAGNOSIS — I10 Essential (primary) hypertension: Secondary | ICD-10-CM | POA: Diagnosis present

## 2014-04-09 DIAGNOSIS — I708 Atherosclerosis of other arteries: Secondary | ICD-10-CM | POA: Diagnosis present

## 2014-04-09 DIAGNOSIS — M79609 Pain in unspecified limb: Secondary | ICD-10-CM

## 2014-04-09 DIAGNOSIS — I701 Atherosclerosis of renal artery: Secondary | ICD-10-CM | POA: Diagnosis present

## 2014-04-09 DIAGNOSIS — F17210 Nicotine dependence, cigarettes, uncomplicated: Secondary | ICD-10-CM | POA: Diagnosis present

## 2014-04-09 DIAGNOSIS — I429 Cardiomyopathy, unspecified: Secondary | ICD-10-CM | POA: Diagnosis present

## 2014-04-09 DIAGNOSIS — D649 Anemia, unspecified: Secondary | ICD-10-CM | POA: Diagnosis present

## 2014-04-09 DIAGNOSIS — Z79899 Other long term (current) drug therapy: Secondary | ICD-10-CM | POA: Diagnosis not present

## 2014-04-09 DIAGNOSIS — I739 Peripheral vascular disease, unspecified: Secondary | ICD-10-CM | POA: Diagnosis present

## 2014-04-09 DIAGNOSIS — L7622 Postprocedural hemorrhage and hematoma of skin and subcutaneous tissue following other procedure: Secondary | ICD-10-CM | POA: Diagnosis not present

## 2014-04-09 DIAGNOSIS — R1310 Dysphagia, unspecified: Secondary | ICD-10-CM | POA: Diagnosis present

## 2014-04-09 DIAGNOSIS — I959 Hypotension, unspecified: Secondary | ICD-10-CM | POA: Diagnosis not present

## 2014-04-09 DIAGNOSIS — Z9889 Other specified postprocedural states: Secondary | ICD-10-CM

## 2014-04-09 DIAGNOSIS — Z79891 Long term (current) use of opiate analgesic: Secondary | ICD-10-CM | POA: Diagnosis not present

## 2014-04-09 LAB — HEMOGLOBIN AND HEMATOCRIT, BLOOD
HCT: 23.8 % — ABNORMAL LOW (ref 36.0–46.0)
Hemoglobin: 7.1 g/dL — ABNORMAL LOW (ref 12.0–15.0)

## 2014-04-09 LAB — PREPARE RBC (CROSSMATCH)

## 2014-04-09 MED ORDER — FUROSEMIDE 10 MG/ML IJ SOLN
20.0000 mg | Freq: Once | INTRAMUSCULAR | Status: AC
  Administered 2014-04-09: 20 mg via INTRAVENOUS

## 2014-04-09 MED ORDER — FUROSEMIDE 10 MG/ML IJ SOLN
INTRAMUSCULAR | Status: AC
  Filled 2014-04-09: qty 4

## 2014-04-09 MED ORDER — SODIUM CHLORIDE 0.9 % IV SOLN: Freq: Once | INTRAVENOUS | Status: DC

## 2014-04-09 MED ORDER — HYDROCODONE-ACETAMINOPHEN 10-325 MG PO TABS
1.0000 | ORAL_TABLET | ORAL | Status: DC | PRN
  Administered 2014-04-09 – 2014-04-10 (×5): 1 via ORAL
  Filled 2014-04-09 (×4): qty 1

## 2014-04-09 NOTE — Progress Notes (Signed)
Pt received 1  unit RBC's followed by 20 mg IV Lasix without incident.

## 2014-04-09 NOTE — Progress Notes (Signed)
VASCULAR LAB PRELIMINARY  PRELIMINARY  PRELIMINARY  PRELIMINARY  Right groin ultrasound completed.    Preliminary report:  There is no obvious evidence of pseudoaneurysm or AV fistula noted in the right groin.   Chidinma Clites, RVT 04/09/2014, 11:18 AM

## 2014-04-09 NOTE — Progress Notes (Signed)
Subjective: Still having pain in the right groin.  Feet hurt all night.  Objective: Vital signs in last 24 hours: Temp:  [97.8 F (36.6 C)-98.2 F (36.8 C)] 98 F (36.7 C) (12/05 0440) Pulse Rate:  [73-83] 73 (12/05 0440) Resp:  [16-20] 18 (12/05 0440) BP: (80-129)/(32-53) 102/42 mmHg (12/05 0440) SpO2:  [93 %-100 %] 95 % (12/05 0440) Weight:  [132 lb 7.9 oz (60.1 kg)] 132 lb 7.9 oz (60.1 kg) (12/04 2354) Last BM Date: 04/08/14  Intake/Output from previous day: 12/04 0701 - 12/05 0700 In: 680 [P.O.:680] Out: 600 [Urine:600] Intake/Output this shift:    Medications Current Facility-Administered Medications  Medication Dose Route Frequency Provider Last Rate Last Dose  . ALPRAZolam Duanne Moron) tablet 0.25 mg  0.25 mg Oral QHS PRN Eileen Stanford, PA-C   0.25 mg at 04/09/14 0998  . aspirin EC tablet 81 mg  81 mg Oral Daily Evelene Croon Barrett, PA-C   81 mg at 04/08/14 0920  . carvedilol (COREG) tablet 6.25 mg  6.25 mg Oral BID WC Almyra Deforest, PA   6.25 mg at 04/08/14 1758  . clopidogrel (PLAVIX) tablet 75 mg  75 mg Oral Daily Evelene Croon Barrett, PA-C   75 mg at 04/08/14 0920  . digoxin (LANOXIN) tablet 0.125 mg  0.125 mg Oral Daily Evelene Croon Barrett, PA-C   0.125 mg at 04/08/14 0920  . enalapril (VASOTEC) tablet 5 mg  5 mg Oral Daily Lonn Georgia, PA-C   Stopped at 04/08/14 0920  . fentaNYL (SUBLIMAZE) injection 50 mcg  50 mcg Intravenous Q1H PRN Wellington Hampshire, MD   50 mcg at 04/07/14 1443  . gabapentin (NEURONTIN) capsule 300 mg  300 mg Oral Daily Evelene Croon Barrett, PA-C   300 mg at 04/08/14 2204  . HYDROcodone-acetaminophen (NORCO) 10-325 MG per tablet 1 tablet  1 tablet Oral Q6H PRN Lonn Georgia, PA-C   1 tablet at 04/09/14 0451  . levothyroxine (SYNTHROID, LEVOTHROID) tablet 125 mcg  125 mcg Oral QAC breakfast Evelene Croon Barrett, PA-C   125 mcg at 04/09/14 0700  . spironolactone (ALDACTONE) tablet 12.5 mg  12.5 mg Oral Daily Rhonda G Barrett, PA-C   12.5 mg at 04/08/14 0920      PE: General appearance: alert, cooperative and no distress Lungs: + rhonchi and wheeze Heart: regular rate and rhythm, S1, S2 normal, no murmur, click, rub or gallop Extremities: No LEE Pulses: Radials 2+, 2+ right and left PT,  Bruit in the right groin. New? Skin: Warm and dry.  Very large area of ecchymosis and hematoma in the right groin.  Very tender.  Neurologic: Grossly normal  Lab Results:   Recent Labs  04/06/14 2045  04/08/14 1520 04/08/14 2200 04/09/14 0332  WBC 8.6  --   --   --   --   HGB 8.7*  < > 7.7* 7.4* 7.1*  HCT 29.5*  < > 26.2* 24.8* 23.8*  PLT 417*  --   --   --   --   < > = values in this interval not displayed. BMET  Recent Labs  04/06/14 1333  NA 132*  K 3.7  CL 114*  GLUCOSE 110*  BUN 6  CREATININE 0.60   CT ABDOMEN AND PELVIS WITHOUT CONTRAST  TECHNIQUE: Multidetector CT imaging of the abdomen and pelvis was performed following the standard protocol without IV contrast.  COMPARISON: 11/30/2004  FINDINGS: There is left lower lobe consolidation the could represent either atelectasis or pneumonitis.  Atelectasis is considered to be more likely.  Liver is normal. There is a small volume of dense material likely sludge layering within the gallbladder. Spleen is normal. Pancreas is normal.  Adrenal glands are normal. Kidneys are normal.  Stomach and small bowel are normal. Large bowel is normal. There is left pericolic gutter fat encapsulation with calcification measuring about 2 cm, likely related to benign fat necrosis. There is extensive calcification of the aortoiliac vessels with right iliac stent noted.  Bladder is normal. Reproductive organs not identified.  There is moderate inflammatory change in the right inguinal region. Within the subcutaneous soft tissues there is a mildly hyper attenuating 7.4 x 1.8 by 5.8 cm collection. Inflammatory change extends down to the level of the femoral artery and vein. There is also  a 3 x 9 cm partially visualized collection extending into the right labia and perineum.  IMPRESSION: Multi compartmental hematoma in the right inguinal and perineum region. Possibility of pseudoaneurysm would be better evaluated with ultrasound. Assessment/Plan    Principal Problem:   PAD (peripheral artery disease) Active Problems:   NICM (nonischemic cardiomyopathy)   HTN (hypertension)   Tobacco abuse   Claudication   Hematoma of groin   Pill dysphagia  1. PAD (peripheral artery disease)  s/p LE angiography 04/06/2014 significant bilateral renal artery stenosis, significant R CIA stenosis and L EIA stenosis treated with self-expanding stent placement x 2.  Diffusely disease L SFA which is not favorable for intervention given small diameter and long diffuse disease (if fail medical treatment, can consider atherectomy later) started having R groin hematoma and ecchymosis after procedure. No bruit or pulsatile mass on exam.   2. Hematoma of groin - right groin very tender and sore, pt would not tolerate [place] right now.  CT ABD/Pel:  Multi compartmental hematoma in the right inguinal and perineum region. Possibility of pseudoaneurysm would be better evaluated with ultrasound.  Hgb is now 7.1 down from 9.4 on 12/3.  It is relatively stable.  The last sic Hgb's were 7.4, 7.4, 7.4, 7.7, 7.4, 7.1.  Will get pseudo dopplers this morning.  Recommend giving a unit of PRBCs.   3. HTN (hypertension) - Still hypotensive coreg decreased from 12.5 BID to 6.25 BID yesterday. Continue spironolactone. Continue to hold ACEI  4. Tobacco abuse  Quit a month ago  5. Claudication - secondary to PAD  6. NICM - watch volume status, held Lasix post-cath, no sx overload now.   - on ACEI and coreg  7. Pill dysphagia - can crush Plavix, she is willing, says no problems with food. Weight up/down, but feels that has been due to thyroid, feels her weight has stabilized  now - seem to have worsened over last month. H/o ulcers. No endoscopy in recent yrs. Feels like pills get stuck. Recommend outpatient GI followup. Consider add pronotix. Unclear if has recurrent ulcer vs esophageal stricture that's causing the problem  Disposition:  She agreed to go to SNF short term.  CSW working on it.  Transfer to tele today.    LOS: 3 days  As above, patient seen and examined. She continues to have right groin pain. Significant hematoma and bruit on examination. Abdominal CT results noted. Check ultrasound to exclude pseudoaneurysm. Hemoglobin 7.1. Will transfuse 1 unit packed red blood cells. Blood pressure borderline. Hold ACE inhibitor and spironolactone for now. Discontinue every 6 hours hematocrits. Recheck hemoglobin and renal function tomorrow morning. Plan to resume cardiac medications as her blood pressure stabilizes. Transfer to  telemetry.  HAGER, BRYAN PA-C 04/09/2014 7:27 AM

## 2014-04-09 NOTE — Progress Notes (Signed)
Utilization Review completed.  

## 2014-04-10 ENCOUNTER — Other Ambulatory Visit: Payer: Self-pay | Admitting: Physician Assistant

## 2014-04-10 DIAGNOSIS — S301XXA Contusion of abdominal wall, initial encounter: Secondary | ICD-10-CM

## 2014-04-10 DIAGNOSIS — I739 Peripheral vascular disease, unspecified: Secondary | ICD-10-CM

## 2014-04-10 LAB — TYPE AND SCREEN
ABO/RH(D): O NEG
ANTIBODY SCREEN: NEGATIVE
Unit division: 0

## 2014-04-10 LAB — BASIC METABOLIC PANEL
Anion gap: 11 (ref 5–15)
BUN: 10 mg/dL (ref 6–23)
CALCIUM: 9.1 mg/dL (ref 8.4–10.5)
CO2: 26 mEq/L (ref 19–32)
Chloride: 98 mEq/L (ref 96–112)
Creatinine, Ser: 0.69 mg/dL (ref 0.50–1.10)
GFR calc Af Amer: >90 mL/min (ref >90)
Glucose, Bld: 92 mg/dL (ref 70–99)
Potassium: 4.8 mEq/L (ref 3.7–5.3)
SODIUM: 135 meq/L — AB (ref 137–147)

## 2014-04-10 LAB — CBC
HCT: 31.2 % — ABNORMAL LOW (ref 36.0–46.0)
Hemoglobin: 9.2 g/dL — ABNORMAL LOW (ref 12.0–15.0)
MCH: 23.1 pg — AB (ref 26.0–34.0)
MCHC: 29.5 g/dL — AB (ref 30.0–36.0)
MCV: 78.4 fL (ref 78.0–100.0)
Platelets: 537 10*3/uL — ABNORMAL HIGH (ref 150–400)
RBC: 3.98 MIL/uL (ref 3.87–5.11)
RDW: 18.9 % — ABNORMAL HIGH (ref 11.5–15.5)
WBC: 7.9 10*3/uL (ref 4.0–10.5)

## 2014-04-10 MED ORDER — ALPRAZOLAM 0.25 MG PO TABS: 0.2500 mg | ORAL_TABLET | Freq: Every evening | ORAL | Status: DC | PRN

## 2014-04-10 MED ORDER — CARVEDILOL 6.25 MG PO TABS: 6.2500 mg | ORAL_TABLET | Freq: Two times a day (BID) | ORAL | Status: DC

## 2014-04-10 MED ORDER — ASPIRIN 81 MG PO TBEC: 81.0000 mg | DELAYED_RELEASE_TABLET | Freq: Every day | ORAL | Status: DC

## 2014-04-10 MED ORDER — FUROSEMIDE 40 MG PO TABS: 40.0000 mg | ORAL_TABLET | Freq: Every day | ORAL | Status: DC | PRN

## 2014-04-10 NOTE — Discharge Summary (Signed)
Physician Discharge Summary     Cardiologist:  Gollan/Arida-PV Patient ID: [name] MRN: [id number] DOB/AGE: 64-11-1950 64 y.o.  Admit date: 04/06/2014 Discharge date: 04/10/2014  Admission Diagnoses:PAD  Discharge Diagnoses:  Principal Problem:   PAD (peripheral artery disease) Active Problems:   NICM (nonischemic cardiomyopathy)   HTN (hypertension)   Tobacco abuse   Claudication   Hematoma of groin   Pill dysphagia   Discharged Condition: stable  Hospital Course:   63 year old woman with history of nonischemic cardiopathy, ejection fraction 20-25% presented to the hospital 02/08/2014 with acute on chronic systolic CHF, treated with diuresis.  Follow-up stress testing showing mild fixed defect noted in the inferior wall on nonattenuation corrected images. Attenuation corrected images shows small mild perfusion defect in the apical and distal anterior wall with possible small mild ischemia.  She had cardiac catheterization that showed no significant CAD.  She presented on 03/24/14 to see Dr. Rockey Situ for follow-up after recent cardiac catheterization.  Cardiac catheterization showed no significant coronary disease. Severely depressed ejection fraction, global hypokinesis.  She had severe right common iliac arterial disease, significant disease of the internal iliac and right common femoral artery.  She has claudication symptoms on a regular basis on the right, also has pain in her right buttock. She reports having periodic episodes of shortness of breath. She takes extra Lasix, and eventually her symptoms resolve.  Denies having significant lower extremity edema.  Not very active at baseline. No significant cough, no orthostasis, no chest pain.  Right groin has healed, still with a small knot.   EKG shows normal sinus rhythm with rate 89 bpm, nonspecific ST abnormality in the anterolateral leads.  She presented on 12/2 for PV angiogram which revealed significant bilateral  renal artery stenosis. No evidence of aortic aneurysm.  Significant right common iliac artery stenosis and left external iliac artery stenosis.  Diffusely diseased left SFA.  She underwent successful self-expanding stent placement to the left external iliac artery and right common iliac arteries.  Plavix for at [facility] one month.  Post cath she developed a large hematoma and severe pain in the right groin.  Her hgb dropped from 10.9 to 7.1.  CT abd/pelvis showed multi compartmental hematoma in the right inguinal and perineum region. Possibility of pseudoaneurysm. Vascular dopplers revealed no pseudoaneurysm.  Norco was given for pain which improved considerably the morning of DC.  She was transfused one unit PRBCs with improvement of Hgb to 9.2.  She was seen by PT on 12/4 and they recommended short term SNF.  I had the patient ambulate the length of th hall with the RN the morning of DC and she did so using a walker without difficulties.   Aldactone and ACE were stopped due to hypotension.  We will consider restarting in the office.  Will send out on PRN lasix.  She also said her father died this morning at 42hrs.  Will give PRN Xanax.  Follow up LEA dopplers in two weeks.  The patient was seen by Dr. Debara Pickett who felt she was stable for DC home.    Consults: None  Significant Diagnostic Studies:  CT ABDOMEN AND PELVIS WITHOUT CONTRAST  TECHNIQUE: Multidetector CT imaging of the abdomen and pelvis was performed following the standard protocol without IV contrast.  COMPARISON: 11/30/2004  FINDINGS: There is left lower lobe consolidation the could represent either atelectasis or pneumonitis. Atelectasis is considered to be more likely.  Liver is normal. There is a small volume of dense material likely  sludge layering within the gallbladder. Spleen is normal. Pancreas is normal.  Adrenal glands are normal. Kidneys are normal.  Stomach and small bowel are normal. Large bowel is normal.  There is left pericolic gutter fat encapsulation with calcification measuring about 2 cm, likely related to benign fat necrosis. There is extensive calcification of the aortoiliac vessels with right iliac stent noted.  Bladder is normal. Reproductive organs not identified.  There is moderate inflammatory change in the right inguinal region. Within the subcutaneous soft tissues there is a mildly hyper attenuating 7.4 x 1.8 by 5.8 cm collection. Inflammatory change extends down to the level of the femoral artery and vein. There is also a 3 x 9 cm partially visualized collection extending into the right labia and perineum.  IMPRESSION: Multi compartmental hematoma in the right inguinal and perineum region. Possibility of pseudoaneurysm would be better evaluated with ultrasound.   PERIPHERAL VASCULAR PROCEDURE  NAME: Madeline Totman SkenesMRN:[id number] DOB: 1952/04/22ADMIT DATE: 04/06/2014  Performing Cardiologist: Kathlyn Sacramento Primary Physician: Golden Pop, MD Primary Cardiologist: Dr. Rockey Situ  Procedures Performed:  Abdominal Aortic Angiogram with Bi-Iliofemoral Runoff  Bilateral Lower Extremity Angiography (1st Order)  Left external iliac artery self-expanding stent placement  Right common iliac artery self-expanding stent placement    Indication(s):   Claudication    Consent: The procedure with Risks/Benefits/Alternatives and Indications was reviewed with the patient . All questions were answered.  Medications: Sedation: 1 mg IV Versed, 100 mcg IV Fentanyl Contrast: 139 ml Visipaque   Procedural details: The right groin was prepped, draped, and anesthetized with 1% lidocaine. Using modified Seldinger technique, a 4 micropuncture [place] sheath was introduced into the right common femoral artery. This was exchanged into a 5 [place] sheath. A  5 Fr Short Pigtail Catheter was advanced of over a Versicore wire into the descending Aorta to a level just above the renal arteries. A power injection of 45ml/sec contrast over 1 sec was performed for Abdominal Aortic Angiography. The catheter was then pulled back to a level just above the Aortic bifurcation, and a second power injection was performed to evaluate the iliac arteries.   Bilateral lower extremity arterial run off was then performed via power injection of 7 ml / sec contrast for a total of 77 ml.  Interventional Procedure:  The pigtail catheter was changed over the Versicore wire for A crossover catheter which was then pulled back the aortic bifurcation and the wire was advanced down the contralateral common iliac artery. The wire was then advanced to the contralateral common femoral artery, the catheter was exchanged into a 6 [place] destination sheath. I then placed an 8 x 30 mm Smart self-expanding stent in the distal left external iliac artery. This was postdilated with a 7 mm balloon. Angiography showed excellent results with 0% residual stenosis. The sheath was then pulled back to the right external iliac artery. The lesion in the common femoral iliac artery was direct stented with a 9 x 40 mm Smart self-expanding stent which was postdilated with an 8 mm balloon with excellent results and 0% residual stenosis. There was moderate 40 to 50% disease in the right external iliac artery which was left to be treated medically. The patient was given a total of 7000 units of unfractionated heparin to maintain an ACT above 200. The sheath was secured in place to be removed manually.   Hemodynamics: Central Aortic Pressure / Mean Aortic Pressure: 138/80  Findings:  Abdominal aorta: Normal in size with no evidence of aneurysm. There is mild  irregularities.  Left renal artery: 80% ostial stenosis.  Right renal artery: 80% ostial stenosis.  Celiac artery: Patent  Superior  mesenteric artery: Patent  Right common iliac artery: Normal in size with 80-90% mid stenosis followed by 50% disease.  Right internal iliac artery: 50% ostial stenosis.  Right external iliac artery: Diffuse 40-50% disease.  Right common femoral artery: There is 80% focal stenosis distally.  Right profunda femoral artery: Normal  Right superficial femoral artery: Diffuse 30-40% disease throughout the vessel with no obstructive disease.  Right popliteal artery: 50% proximal disease above the knee  Three-vessel runoff below the knee with small posterior tibial artery  Left common iliac artery: Normal in size with 20% diffuse disease  Left internal iliac artery: 40% ostial disease.  Left external iliac artery: 80% distal disease.  Left common femoral artery: 30% stenosis.  Left profunda femoral artery: Normal  Left superficial femoral artery: The whole vessel is small in diameter and diffusely diseased with 40% disease starting at the ostium. This is followed by a 90% stenosis proximally and diffuse 50-70% disease throughout the mid and distal segment.  Left popliteal artery: 80% mid stenosis behind the knee.  Three-vessel runoff below the knee. However, the posterior tibial artery appears to be occluded distally.  Conclusions: 1. Significant bilateral renal artery stenosis. No evidence of aortic aneurysm. 2. Significant right common iliac artery stenosis and left external iliac artery stenosis. 3. Diffusely diseased left SFA. 4. Successful self-expanding stent placement to the left external iliac artery and right common iliac arteries.  Recommendations:  Plavix for at least one month. The left SFA disease is not favorable for endovascular intervention given the small diameter and long diffuse disease. Attempted medical therapy is recommended. If unsuccessful, atherectomy can be considered.   Kathlyn Sacramento, MD, [facility] 04/06/2014  Treatments: See above  Discharge  Exam: Blood pressure 109/51, pulse 81, temperature 98.7 F (37.1 C), temperature source Oral, resp. rate 20, height 5\' 5"  (1.651 m), weight 121 lb 11.1 oz (55.2 kg), SpO2 92 %.   Disposition: Final discharge disposition not confirmed      Discharge Instructions    Diet - low sodium heart healthy    Complete by:  As directed      Discharge instructions    Complete by:  As directed   Monitor your weight every morning.  If you gain 3 pounds in 24 hours, or 5 pounds in a week, take lasix.     Increase activity slowly    Complete by:  As directed             Medication List    STOP taking these medications        enalapril 5 MG tablet  Commonly known as:  VASOTEC     spironolactone 25 MG tablet  Commonly known as:  ALDACTONE      TAKE these medications        ALPRAZolam 0.25 MG tablet  Commonly known as:  XANAX  Take 1 tablet (0.25 mg total) by mouth at bedtime as needed for anxiety.     aspirin 81 MG EC tablet  Take 1 tablet (81 mg total) by mouth daily.     carvedilol 6.25 MG tablet  Commonly known as:  COREG  Take 1 tablet (6.25 mg total) by mouth 2 (two) times daily with a meal.     clopidogrel 75 MG tablet  Commonly known as:  PLAVIX  Take 1 tablet (75 mg total) by mouth daily.  digoxin 0.125 MG tablet  Commonly known as:  LANOXIN  Take 0.125 mg by mouth daily.     furosemide 40 MG tablet  Commonly known as:  LASIX  Take 1 tablet (40 mg total) by mouth daily as needed.     gabapentin 300 MG capsule  Commonly known as:  NEURONTIN  Take 300 mg by mouth daily.     HYDROcodone-acetaminophen 10-325 MG per tablet  Commonly known as:  NORCO  Take 1 tablet by mouth every 6 (six) hours as needed.     levothyroxine 125 MCG tablet  Commonly known as:  SYNTHROID, LEVOTHROID  Take 125 mcg by mouth daily before breakfast.       Follow-up Information    Follow up with Kathlyn Sacramento, MD In 2 weeks.   Specialty:  Cardiology   Why:  The office will call  you.   Contact information:   [place] [address] [phone]       Follow up with Ultrasound.   Why:  The office will call you with the appt time.    Contact information:   [place] [address] [phone]     Greater than 30 minutes was spent completing the patient's discharge.   SignedTarri Fuller, [place] 04/10/2014, 11:34 AM

## 2014-04-10 NOTE — Progress Notes (Signed)
D/c instructions and Rx given to and d/w pt. Pt verbalizes understanding. Right groin soft. Feet warm. Instructed on groin care and instructions if bleeding or swelling occurs. Pt verbalizes understanding

## 2014-04-10 NOTE — Progress Notes (Signed)
Pt ambulated in hallway using walker with no mdifficulties.

## 2014-04-10 NOTE — Progress Notes (Signed)
Subjective: He leg feels a lot better.  She said her father just past away at 0800hrs this morning.  Feet still hurt while in bed not moving. Improves with walking.   Objective: Vital signs in last 24 hours: Temp:  [97.6 F (36.4 C)-98.9 F (37.2 C)] 98.7 F (37.1 C) (12/06 0555) Pulse Rate:  [69-86] 81 (12/06 0555) Resp:  [18-20] 20 (12/06 0555) BP: (96-129)/(51-82) 109/51 mmHg (12/06 0555) SpO2:  [92 %-100 %] 92 % (12/06 0555) Weight:  [121 lb 11.1 oz (55.2 kg)-125 lb 10.6 oz (57 kg)] 121 lb 11.1 oz (55.2 kg) (12/06 0555) Last BM Date: 04/09/14  Intake/Output from previous day: 12/05 0701 - 12/06 0700 In: 3893 [P.O.:830; I.V.:250; Blood:315] Out: 1500 [Urine:1500] Intake/Output this shift: Total I/O In: 480 [P.O.:480] Out: -   Medications Current Facility-Administered Medications  Medication Dose Route Frequency Provider Last Rate Last Dose  . 0.9 %  sodium chloride infusion   Intravenous Once Brett Canales, PA-C      . ALPRAZolam Duanne Moron) tablet 0.25 mg  0.25 mg Oral QHS PRN Eileen Stanford, PA-C   0.25 mg at 04/09/14 7342  . aspirin EC tablet 81 mg  81 mg Oral Daily Evelene Croon Barrett, PA-C   81 mg at 04/10/14 8768  . carvedilol (COREG) tablet 6.25 mg  6.25 mg Oral BID WC Almyra Deforest, PA   6.25 mg at 04/10/14 0942  . clopidogrel (PLAVIX) tablet 75 mg  75 mg Oral Daily Evelene Croon Barrett, PA-C   75 mg at 04/10/14 0942  . digoxin (LANOXIN) tablet 0.125 mg  0.125 mg Oral Daily Evelene Croon Barrett, PA-C   0.125 mg at 04/10/14 0942  . fentaNYL (SUBLIMAZE) injection 50 mcg  50 mcg Intravenous Q1H PRN Wellington Hampshire, MD   50 mcg at 04/07/14 1443  . gabapentin (NEURONTIN) capsule 300 mg  300 mg Oral Daily Evelene Croon Barrett, PA-C   300 mg at 04/10/14 0942  . HYDROcodone-acetaminophen (NORCO) 10-325 MG per tablet 1 tablet  1 tablet Oral Q4H PRN Brett Canales, PA-C   1 tablet at 04/10/14 [phone]  . levothyroxine (SYNTHROID, LEVOTHROID) tablet 125 mcg  125 mcg Oral QAC breakfast Lonn Georgia, PA-C   125 mcg at 04/10/14 2620    PE: General appearance: alert, cooperative and no distress Lungs: + rhonchi and wheeze Heart: regular rate and rhythm, S1, S2 normal, no murmur, click, rub or gallop Extremities: No LEE Pulses: Radials 2+, 2+ right and left PT, Bruit in the right groin. New? Skin: Warm and dry. Very large area of ecchymosis in the right groin. only mildly tender today.  The area is much softer as well.   Neurologic: Grossly normal  Lab Results:   Recent Labs  04/08/14 2200 04/09/14 0332 04/10/14 0430  WBC  --   --  7.9  HGB 7.4* 7.1* 9.2*  HCT 24.8* 23.8* 31.2*  PLT  --   --  537*   BMET  Recent Labs  04/10/14 0430  NA 135*  K 4.8  CL 98  CO2 26  GLUCOSE 92  BUN 10  CREATININE 0.69  CALCIUM 9.1      Assessment/Plan    Principal Problem:   PAD (peripheral artery disease) Active Problems:   NICM (nonischemic cardiomyopathy)   HTN (hypertension)   Tobacco abuse   Claudication   Hematoma of groin   Pill dysphagia  1. PAD (peripheral artery disease)  s/p LE angiography 04/06/2014 significant bilateral renal  artery stenosis, significant R CIA stenosis and L EIA stenosis treated with self-expanding stent placement x 2. Diffusely disease L SFA which is not favorable for intervention given small diameter and long diffuse disease (if fail medical treatment, can consider atherectomy later) started having R groin hematoma and ecchymosis after procedure. No bruit or pulsatile mass on exam.   2. Hematoma of groin -  No pseudo on dopplers. CT ABD/Pel: Multi compartmental hematoma in the right inguinal and perineum region. Possibility of pseudoaneurysm would be better evaluated with ultrasound.  Hgb has improved from 7.1 to 9.2 after one unit PRBCs  7, 7.4, 7.1.  3. HTN (hypertension) -   Stable coreg decreased from 12.5 BID to 6.25 BID yesterday.  Spironolactone, ACEI stopped.   4. Tobacco  abuse Quit a month ago  5. Claudication - secondary to PAD  6. NICM - watch volume status, held Lasix post-cath, no sx overload now.     ACE stopped.  Consider restarting ACE in the office.  Lasix PRN.    7. Pill dysphagia - can crush Plavix, she is willing, says no problems with food. Weight up/down, but feels that has been due to thyroid, feels her weight has stabilized now - seem to have worsened over last month. H/o ulcers. No endoscopy in recent yrs. Feels like pills get stuck. Recommend outpatient GI followup. Consider add pronotix. Unclear if has recurrent ulcer vs esophageal stricture that's causing the problem  8. Pain in feet:   Could be from reperfusion. Improves when she walks.  Disposition:  Apparently there were concerns about her needing to go to SNF.  PT recommended.  We will get her ambulating in the hall.  If she does fine, DC home.   She said her father just died today.       LOS: 4 days    Sivan Quast PA-C 04/10/2014 10:15 AM

## 2014-04-13 ENCOUNTER — Encounter: Payer: Self-pay | Admitting: *Deleted

## 2014-04-14 ENCOUNTER — Encounter (HOSPITAL_COMMUNITY): Payer: Self-pay | Admitting: Cardiovascular Disease

## 2014-04-18 ENCOUNTER — Telehealth: Payer: Self-pay | Admitting: Cardiovascular Disease

## 2014-04-18 NOTE — Telephone Encounter (Signed)
Called patient to confirm apt, she states she can't make it for her father just passed away. And since the sten was put in ger groin area has ben black and blue. Please advise.

## 2014-04-18 NOTE — Telephone Encounter (Signed)
Spoke w/ pt.  She reports that she has a hematoma on her groin from her stent placement w/ Dr. Fletcher Anon.  She reports that it is still painful, that she cannot wear underwear and that she does not have any pain meds.  Pt is tearful and asks for an appt to see Dr. Rockey Situ.  Spoke w/ Dr. Fletcher Anon and offered pt appt to come in for him to evaluate her.  She is agreeable to this and will come in tomorrow at 1:30.

## 2014-04-19 ENCOUNTER — Encounter: Payer: Self-pay | Admitting: Cardiovascular Disease

## 2014-04-19 ENCOUNTER — Ambulatory Visit (INDEPENDENT_AMBULATORY_CARE_PROVIDER_SITE_OTHER): Payer: Medicare Other | Admitting: Cardiovascular Disease

## 2014-04-19 ENCOUNTER — Encounter (INDEPENDENT_AMBULATORY_CARE_PROVIDER_SITE_OTHER): Payer: Medicare Other

## 2014-04-19 VITALS — BP 108/78 | HR 88 | Ht 65.0 in | Wt 125.5 lb

## 2014-04-19 DIAGNOSIS — I10 Essential (primary) hypertension: Secondary | ICD-10-CM

## 2014-04-19 DIAGNOSIS — I5022 Chronic systolic (congestive) heart failure: Secondary | ICD-10-CM

## 2014-04-19 DIAGNOSIS — R079 Chest pain, unspecified: Secondary | ICD-10-CM

## 2014-04-19 DIAGNOSIS — I739 Peripheral vascular disease, unspecified: Secondary | ICD-10-CM

## 2014-04-19 MED ORDER — HYDROCODONE-ACETAMINOPHEN 10-325 MG PO TABS: 1.0000 | ORAL_TABLET | Freq: Four times a day (QID) | ORAL | Status: DC | PRN

## 2014-04-19 MED ORDER — ASPIRIN 81 MG PO TABS: 81.0000 mg | ORAL_TABLET | Freq: Every day | ORAL | Status: DC

## 2014-04-19 NOTE — Patient Instructions (Signed)
Your physician has recommended you make the following change in your medication:  Decrease Aspirin 81 mg once daily    Your physician recommends that you schedule a follow-up appointment in:  3 months with Dr. Fletcher Anon
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
## 2014-04-20 NOTE — Assessment & Plan Note (Signed)
She appears to be euvolemic. 

## 2014-04-20 NOTE — Assessment & Plan Note (Signed)
Blood pressure is controlled on current medications. 

## 2014-04-20 NOTE — Assessment & Plan Note (Signed)
She had successful stent placement to the left external iliac and right common iliac arteries. Unfortunately, she had a large post procedure hematoma which continues to be very uncomfortable for her. It seems to be gradually improving. I provided her with refills on oxycodone. I suspect this to gradually improve over the next few weeks. Postprocedure ABI was 0.81 on the right and 0.63 on the left. There is improvement on the right. The left is still unchanged likely due to diffuse disease in the left SFA.

## 2014-04-21 ENCOUNTER — Encounter: Payer: Medicare Other | Admitting: Cardiovascular Disease

## 2014-05-03 NOTE — Progress Notes (Signed)
LVM 12/29

## 2014-05-04 ENCOUNTER — Telehealth: Payer: Self-pay | Admitting: *Deleted

## 2014-05-04 DIAGNOSIS — I739 Peripheral vascular disease, unspecified: Secondary | ICD-10-CM

## 2014-05-04 NOTE — Telephone Encounter (Signed)
-----   Message from Wellington Hampshire, MD sent at 04/27/2014  9:25 AM EST ----- Improved ABI on the right after stent. Recommend an aortoiliac duplex in 3 months.

## 2014-05-13 ENCOUNTER — Other Ambulatory Visit: Payer: Self-pay | Admitting: Radiology

## 2014-05-13 DIAGNOSIS — I739 Peripheral vascular disease, unspecified: Secondary | ICD-10-CM

## 2014-05-17 ENCOUNTER — Telehealth: Payer: Self-pay

## 2014-05-17 ENCOUNTER — Other Ambulatory Visit: Payer: Self-pay

## 2014-05-17 NOTE — Telephone Encounter (Signed)
Nurse with [facility] called, states pt needs Digoxin refill, but not sure if Dr. Fletcher Anon still wants pt taking this. Please advise.

## 2014-05-19 MED ORDER — DIGOXIN 125 MCG PO TABS: 0.1250 mg | ORAL_TABLET | Freq: Every day | ORAL | Status: DC

## 2014-05-19 NOTE — Telephone Encounter (Signed)
LVM 1/14

## 2014-05-19 NOTE — Telephone Encounter (Signed)
Digoxin refilled  Home health RN verbalized understanding

## 2014-05-19 NOTE — Telephone Encounter (Signed)
Continue Digoxin.

## 2014-06-01 ENCOUNTER — Telehealth: Payer: Self-pay | Admitting: *Deleted

## 2014-06-01 NOTE — Telephone Encounter (Signed)
Faxed [facility] orders for continuing digoxin

## 2014-07-13 ENCOUNTER — Telehealth: Payer: Self-pay | Admitting: *Deleted

## 2014-07-13 NOTE — Telephone Encounter (Signed)
Spoke w/ pt. She reports wt gain and increased ankle edema.  Reports that she takes her lasix as needed "when I can't see the bone on these skinny legs". She states that she does not wear her compression hose, is unsure if she still has any. She has been eating out recently and has not followed a low sodium diet.  She has been drinking lots of water lately, trying to get her "8 cups a day". Offered pt appt to see Dr. Fletcher Anon tomorrow, but she states that she has an appt tomorrow at 2:00 to keep her apartment. Advised her that I will make Dr. Rockey Situ aware and call her back w/ any recommendations, as she cannot get over here.  She is appreciative and verbalizes understanding to take her lasix today, limit her sodium and fluids, keep her feet elevated and wear her compression hose.

## 2014-07-13 NOTE — Telephone Encounter (Signed)
Would take Lasix twice a day for 3 days Take with potassium daily, 20 meq. Been back to Lasix daily With potassium 10 meq  Definitely cut back on fluids Only drink when she is thirsty, not 8 glasses per day Decrease fluids when she eats out

## 2014-07-13 NOTE — Telephone Encounter (Signed)
Nurse from [facility] is calling stating that patient is gaining weight.  Slow but constant weight gain 11 pounds past month laskic is 40 mg as needed Been taking that 3-4 times a week Increased Orthopnea  The measurements in ankle went from  18 to 26.5 left ankle  Right not at much Does not feel well usually now days  Doesn't seen doctor until 48 st  Question is should they need any xray and would they need a change in medication. Please call and advise.

## 2014-07-14 NOTE — Telephone Encounter (Signed)
Spoke w/ pt.  Advised her of Dr. Donivan Scull recommendation.  She verbalizes understanding and is appreciative of the call.  Asked her to call back w/ any questions or concerns.

## 2014-07-18 ENCOUNTER — Telehealth: Payer: Self-pay | Admitting: *Deleted

## 2014-07-18 NOTE — Telephone Encounter (Signed)
Vaughan Basta, RN w/ [facility] left message on my vm that pt has been taking lasix 40 mg BID for the past 5 days, not the 20 mg for 3 days that she was advised.  She states that when she saw pt on 07/13/14, pt was "full of fluid". Reports that pt has several bottles of lasix at home, some 20 mg and some 40 mg, so she wants to clarify the dose that pt is supposed to be on. Our records indicate 20 mg BID prn, whereas home health orders state 40 mg BID prn.  Pt was due to be d/c'd from home health, but she would like to continue w/ these visits, as pt has not been following directions w/ her meds. She requests that Dr. Rockey Situ sign for these visits to continue.  She asks that pt's lasix be held for a couple of days to get her BP back up.  Please advise.  Thank you.

## 2014-07-18 NOTE — Telephone Encounter (Signed)
Nurse linda calling for patinet   pt is taking laskic 40 mg twice a day Since last Wednesday Today she only taken 1  But her BP sitting 88/60 and standing 78/50 This is low for pt And she says pt had a small dizzy episode.  Last night when she went to get water last night.  Weight is down a lot Asking if we can lower frusemide amount down.  Please advise.

## 2014-07-18 NOTE — Telephone Encounter (Signed)
Left message for Vaughan Basta, [place], that per our conversation on 07/13/14, pt was only to increase her lasix for 3 days, then resume once daily dosing.  Asked her to call back w/ any questions or concerns.

## 2014-07-19 NOTE — Telephone Encounter (Signed)
When she saw me in November 2015 she was taking 40 mg daily Only extra Lasix in the afternoon for shortness of breath, fluid retention, signs of heart failure  She saw Dr. Fletcher Anon December 2015 and  Lasix was changed to 20 mg twice a day That is why she has to prescriptions  For now would hold Lasix Need to know what a good weight for her is when she is not dehydrated. When blood pressure improves, no significant drop with standing/orthostatic, Then would restart Lasix 40 mg daily Only takes extra Lasix in the afternoon for shortness of breath, weight gain, leg edema Would take Lasix with potassium 10

## 2014-07-20 NOTE — Telephone Encounter (Signed)
Spoke w/ Vaughan Basta, RN w/ [facility].   Advised her of Dr. Donivan Scull recommendation.  She verbalizes understanding and will go out and draw a BMET on pt today.  Spoke w/ pt.  She is sched to see Dr. Rockey Situ 08/18/14 @ 3:00.

## 2014-07-22 ENCOUNTER — Telehealth: Payer: Self-pay

## 2014-07-22 NOTE — Telephone Encounter (Signed)
Nurse with [facility], states she is seeing the pt today, and pt is not on potassium, and does not have a rx for potassium. [facility] is her pharmacy

## 2014-07-25 NOTE — Telephone Encounter (Signed)
Spoke w/ Vaughan Basta, RN.  Gave verbal order for BMET, pt she is concerned about pt's K+.

## 2014-07-25 NOTE — Telephone Encounter (Signed)
Nurse is now calling to see if would be okay to draw blood and see if she needs to be on potassium.

## 2014-07-26 ENCOUNTER — Ambulatory Visit: Payer: Self-pay | Admitting: Family Medicine

## 2014-08-03 ENCOUNTER — Ambulatory Visit: Payer: Self-pay | Admitting: Cardiovascular Disease

## 2014-08-04 ENCOUNTER — Encounter (INDEPENDENT_AMBULATORY_CARE_PROVIDER_SITE_OTHER): Payer: Medicare Other

## 2014-08-04 DIAGNOSIS — I700 Atherosclerosis of aorta: Secondary | ICD-10-CM

## 2014-08-04 DIAGNOSIS — I739 Peripheral vascular disease, unspecified: Secondary | ICD-10-CM

## 2014-08-08 ENCOUNTER — Ambulatory Visit (INDEPENDENT_AMBULATORY_CARE_PROVIDER_SITE_OTHER): Payer: Medicare Other | Admitting: Cardiovascular Disease

## 2014-08-08 ENCOUNTER — Encounter: Payer: Self-pay | Admitting: Cardiovascular Disease

## 2014-08-08 VITALS — BP 140/68 | HR 100 | Ht 65.0 in | Wt 131.5 lb

## 2014-08-08 DIAGNOSIS — I10 Essential (primary) hypertension: Secondary | ICD-10-CM

## 2014-08-08 DIAGNOSIS — I739 Peripheral vascular disease, unspecified: Secondary | ICD-10-CM

## 2014-08-08 DIAGNOSIS — E785 Hyperlipidemia, unspecified: Secondary | ICD-10-CM

## 2014-08-08 DIAGNOSIS — I429 Cardiomyopathy, unspecified: Secondary | ICD-10-CM | POA: Diagnosis not present

## 2014-08-08 DIAGNOSIS — I428 Other cardiomyopathies: Secondary | ICD-10-CM

## 2014-08-08 DIAGNOSIS — I5022 Chronic systolic (congestive) heart failure: Secondary | ICD-10-CM

## 2014-08-08 DIAGNOSIS — Z720 Tobacco use: Secondary | ICD-10-CM

## 2014-08-08 DIAGNOSIS — F419 Anxiety disorder, unspecified: Secondary | ICD-10-CM

## 2014-08-08 NOTE — Assessment & Plan Note (Signed)
We have recommended that she restart her Lasix. She has worsening lower extremity edema on the left. Unable to exclude component of venous insufficiency. Recommended compression hose. No swelling on the right. She has declined ICD

## 2014-08-08 NOTE — Assessment & Plan Note (Signed)
She asked today if it was okay for her to have Xanax. This will not interfere with any of her other medications. Suggested she talk with primary care

## 2014-08-08 NOTE — Assessment & Plan Note (Signed)
Blood pressure is well controlled on today's visit. No changes made to the medications. 

## 2014-08-08 NOTE — Patient Instructions (Addendum)
You are doing well.  Please increase the coreg up to 6.25 mg twice a day for heart rate control Please monitor your heart rate  Ok to increase up to 12.5 mg twice a day if needed  Please call us if you have new issues that need to be addressed before your next appt.  Your physician wants you to follow-up in: 6 months.  You will receive a reminder letter in the mail two months in advance. If you don't receive a letter, please call our office to schedule the follow-up appointment.
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
## 2014-08-08 NOTE — Assessment & Plan Note (Signed)
Lower extremity Dopplers reviewed with her.  she denies any claudication symptoms since her bilateral iliac stenting

## 2014-08-08 NOTE — Assessment & Plan Note (Addendum)
Currently not on a statin. No recent lipid panel available. Managed by primary care Goal LDL less than 70 given her severe PAD

## 2014-08-08 NOTE — Assessment & Plan Note (Signed)
We have encouraged her to continue to work on weaning her cigarettes and smoking cessation. She will continue to work on this and does not want any assistance with chantix.  

## 2014-08-26 NOTE — H&P (Signed)
PATIENT NAME:  [name], [name] MR#:  [id number] DATE OF BIRTH:  [birth year]  DATE OF ADMISSION:  10/07/2012  PRIMARY CARE PHYSICIAN: She has no local doctor.   REFERRING PHYSICIAN: Pollie Friar.    CHIEF COMPLAINT: Generalized weakness, found on the floor, frequent falls lately and back pain with infected wound over the back area.   HISTORY OF PRESENT ILLNESS: [name] is a 64 year old Caucasian female who recently is having generalized weakness, multiple falls. Yesterday, she fell and she was unable to get up from the weakness. She waited until the second day when her neighbor found her on the floor. She was incontinent of urine. Feces found on the floor. She tried to get her up from the floor but was unable. She called EMS, and the patient was transported here to the Emergency Department. Evaluation here reveals the presence of head lice, and the patient is not well kempt. She has generalized weakness, appears to be slightly confused; however, with IV hydration here, her mental status improved somewhat. Over the back area, she was found to have a skin laceration that appears to be infected. She has evidence of severe hypothyroidism. The patient is supposed to take her Synthroid. She had similar presentation in January of this year when she presented with generalized weakness and frequent falls and found to have severe hypothyroidism.   REVIEW OF SYSTEMS:   CONSTITUTIONAL: Denies any fever. She has generalized fatigue. No chills.  EYES: No blurring of vision. No double vision.  ENT: No hearing impairment. No sore throat. No dysphagia.  CARDIOVASCULAR: No chest pain. No shortness of breath. No edema.  RESPIRATORY: No cough. No shortness of breath. No chest pain. No hemoptysis.  GASTROINTESTINAL: No abdominal pain. No vomiting. No diarrhea.  GENITOURINARY: No dysuria or frequency of urination.  MUSCULOSKELETAL: She has chronic back pain, exacerbated lately after her recent fall. No joint  swelling. No muscular pain or swelling.  INTEGUMENTARY: No skin rash. No ulcers other than the skin laceration over the right lower back which is infected now.  NEUROLOGY: No focal weakness. No seizure activity. No headache.  PSYCHIATRY: No anxiety. No depression.  ENDOCRINE: No polyuria or polydipsia. No heat or cold intolerance.   PAST MEDICAL HISTORY: Hypothyroidism. Hypertension. History of nonischemic cardiomyopathy in 2008. Her ejection fraction at that time was 20% as diagnosed at [facility]. Subsequent echocardiogram in 2011 showed improvement in ejection fraction up to 50% to 55%. Chronic back pain and history of moderate spinal stenosis at level L2 and L3 and herniated disk disease. History of peptic ulcer disease. Tobacco abuse.   PAST SURGICAL HISTORY: Hysterectomy.   SOCIAL HABITS: Chronic smoker, continues to smoke. Currently, she is smoking 1 pack a day. No history of alcohol or other drug abuse.   SOCIAL HISTORY: She is divorced. Lives at home alone.   FAMILY HISTORY: Her mother is deceased and died from lung cancer. She does not have much information about her father, but he is alive.   ADMISSION MEDICATIONS: The patient is supposed to be taking Synthroid 100 mg a day, enalapril 10 mg twice a day, aspirin 81 mg a day.   ALLERGIES: CODEINE CAUSING SKIN RASH.   PHYSICAL EXAMINATION:  VITAL SIGNS: Blood pressure 113/57, respiratory rate 18, pulse 100, temperature 98.6, oxygen saturation 97%.  GENERAL APPEARANCE: This is an elderly female, appears to be lethargic and sleepy. In no acute distress.  HEAD AND NECK: Mild pallor. No icterus. No cyanosis. Scalp hair was shaved and cleaned in the  Emergency Department. Ear examination revealed normal hearing, no discharge, no lesions. Examination of the nose showed no discharge, no bleeding. Oropharyngeal examination revealed normal lips, dry mucous membranes, no oral thrush, no ulcers. Eye examination revealed normal eyelids except for mild  crust over the eyelashes. Normal conjunctivae. Pupils about 4 to 5 mm, round, equal and reactive to light. Neck is supple. Trachea at midline. No thyromegaly. No cervical lymphadenopathy.  HEART: Normal S1, S2. No S3, S4. No murmur. No gallop. No carotid bruits.  RESPIRATORY: Normal breathing pattern without use of accessory muscles. No rales. No wheezing.  ABDOMEN: Soft without tenderness. No hepatosplenomegaly. No masses. No hernias.  SKIN: No ulcers, no subcutaneous nodules with the exception of the wound over the right lower back area. There is a linear skin laceration. The area looks infected. There is some discharge as well.  MUSCULOSKELETAL: No joint swelling. No clubbing.  NEUROLOGIC: Cranial nerves II through XII were intact. No focal motor deficit.  PSYCHIATRIC: The patient is alert, oriented to the place and people.   LABORATORY FINDINGS: Her EKG showed normal sinus rhythm at rate of 100 per minute. Low voltage. Poor progression of R waves in the anterior chest leads and nonspecific T wave abnormalities. Serum glucose 75, BUN 9, creatinine 1, sodium 134, potassium 3.9. Calcium 7.2. Albumin is 1.9. Bilirubin 1.1, alkaline phosphatase 611, AST 133. CPK elevated at 290. Normal troponin 0.03. TSH is elevated at 36. CBC showed white count of 7000, hemoglobin 12, hematocrit 34, platelet count 263.   ASSESSMENT:  1. Generalized weakness secondary to profound hypothyroidism.  2. Altered mental status. This appears to be improving after intravenous hydration in the Emergency Department.  3. Skin laceration with cellulitis and infected wound over the lower back area, traumatic in origin.  4. Pediculosis capitis.   5. Rhabdomyolysis secondary to falling.  6. Noncompliance.  7. Hypertension.  8. Tobacco abuse.  9. History of congestive cardiomyopathy, improved upon subsequent evaluation with echocardiogram.   PLAN: Will admit to the medical floor. Place the patient on contact isolation. Lice  shampoo was used to clean the scalp area. Will continue IV hydration. Resume Synthroid. Wound care for the back area with wet and dry. IV antibiotic using Ancef 1 g q.8 hours. Wound care consult. Social services consult. The patient needs to be placed if she is agreeable. For deep vein thrombosis prophylaxis, will place the patient on Lovenox subcutaneously.   Time spent in evaluating this patient and reviewing medical records took more than 55 minutes.   ____________________________ Clovis Pu. Lenore Manner, MD amd:gb D: 10/07/2012 01:14:11 ET T: 10/07/2012 02:30:35 ET JOB#: [id number]  cc: Clovis Pu. Lenore Manner, MD, <Dictator> Ellin Saba MD ELECTRONICALLY SIGNED 10/07/2012 3:23

## 2014-08-26 NOTE — Discharge Summary (Signed)
  DISCHARGE DIAGNOSES:  1.  Mild weakness and frequent falls secondarypothyrodism  3.  Low back pain with history of spinal stenosis.  4.  Hypertension.  5.  Hypothyroidism.  6.  Ongoing tobacco abuse.   CONSULTATIONS:  Orthopedic consult  dr.Ramasunder DISCHARGE MEDICATIONS:   Nexium 40 mg p.o. daily,  Vicodin 5.5/325  TID  as needed, for back pain,  eNALAPRIL  10 mg p.o. b.i.d., levothyroxine 100 mcg p.o. daily.   DIET: Low sodium diet.   FHOSPITAL COURSE: The patient is a 64 year old female with history of hypertension, hypothyroidism, not taking medications,came in  for leg weakness and frequent falls for 2 weeks. Look at history and physical for full details. The patient has a history of cardiac myopathy with an EF of 15% to 20%, which improved on further  echos  with EF 55%. admitted for falls/weakness.ad LAB DATA: Unremarkable except (Dictation Anomaly) <<MISSING TEXT>>. She was started back on Synthroid and she had a questionable UTI om  admission with leukocyte esterase 2+, (Dictation Anomaly) <<MISSING TEXT>>. Initially she was started on Cipro urinary tract infection, but urine culture did not show any growth (Dictation Anomaly) <<MISSING TEXT>>. The patient complained of some nausea, vomiting, abdominal pain with Cipro, so we stopped the Cipro her symptoms improved and weakness has improved. She was seen by Dr. Lorene Dy and also she had a physical therapy evaluation done. She has history of back pain and x-ray of lumbosacral spine showed complete loss of disk space within L2-L3,(Dictation Anomaly) <<MISSING TEXT>> and also subluxation at L2-L3. The patient is to advised to follow up with (Dictation Anomaly) for follow up with MRI of the back along with a corticosteroid injection if possible.  The patient, however, she improved in terms of her back pain and she is seeing recommended home physical therapy flow given some prescription for pain medicine.  She had seen physical  therapy, they recommended home physical therapy, so patient given prescription for pain medicine and (Dictation Anomaly) <<MISSING TEXT>> for home physical therapy.  Advised to follow up with Dr. Marland KitchenDictation Anomaly) <<MISSING TEXT>> after.  She was started on Synthroid for her severe hypothyroidism. The patient has no primary care doctor, so we have set up with (Dictation Anomaly) <<MISSING TEXT>>. The patient advised strongly to follow up with them regarding thyroid medication adjustment and for her blood pressure, I have seen readings range (Dictation Anomaly) that we are giving.  She is started with the ACE inhibitors.   TIME SPENT ON DISCHARGE PREPARATION: More than 30 minutes     ____________________________ Epifanio Lesches, MD sk:cc D: 05/23/2012 21:35:00 ET T: 05/24/2012 16:47:24 ET JOB#: [id number]  cc: Epifanio Lesches, MD, <Dictator>

## 2014-08-26 NOTE — Discharge Summary (Signed)
PATIENT NAME:  [name], [name] MR#:  [id number] DATE OF BIRTH:  07/24/1950  DATE OF ADMISSION:  05/20/2012 DATE OF DISCHARGE:  05/22/2012  DISCHARGE DIAGNOSES:   1.  Frequent falls secondary to generalized deconditioning, severe hypothyroidism.  2.  Hypertension.  3.  Peptic ulcer disease.  4.  Ongoing tobacco abuse.  5.  Low back pain with spinal stenosis.   DISCHARGE MEDICATIONS: Nexium 40 mg daily, Vicodin 5/325, 1 tablet p.o. t.i.d., enalapril 10 mg p.o. b.i.d., levothyroxine 100 mcg p.o. daily.  CONSULTATION: Seen by Dr.RAmasunder from  orthopedic.  DIET: Low sodium diet.   The patient also was seen by physical therapy.   HOSPITAL COURSE: This is a 64 year old female with history of hypertension, hypothyroidism, peptic ulcer disease, history of nonischemic cardiomyopathy,EF 25%    at one time which improved on further records, came in because of falls. The patient stopped taking her thyroid medications for the last 1 year. The patient's TSH found to be 100 when she came. Other labs looked okay. The patient was admitted to the hospitalist service for  falls. The patient started back on Synthroid.  The patient was also seen by physical therapist. The patient felt much better after starting back on Synthroid. The patient was given levothyroxine 100 mcg daily and also discharged on the same dose.   The patient advised to follow-up with her primary doctor. The patient has no primary doctor actually. Case manager found [facility] at 9:30 a.m. on January 24 to follow up on the hypothyroidism. The patient was also seen by a physical therapist who recommended home health physical therapy, we arranged that.  Because of back pain, she was seen by orthopedic doctor Dr. Joie Bimler, who suggested x-ray.  X-ray showed some degeneration but no disk herniation. She is advised to follow up with her and also was given a prescription for Vicodin.  1.  Suspected urinary tract infection.  Started on Cipro, but urine culture is negative so we stopped that.  2.  Hypertension. Controlled with medications, enalapril and the patient's blood pressure at the time of discharge 152/84 and temperature 97.5.  The patient had other blood work for the generalized weakness including B12, folate, acid levels and everything was within normal limits.  3. Gastroesophageal reflux disease. Advised to continue her Nexium. The patient advised to take her medications as prescribed. Not to miss the doses.  4. Regarding her smoking,  Counseled again about smoking and also discussed smoking cessation options. The patient went home in stable condition.   TIME SPENT: More than 30 minutes.      ____________________________ Epifanio Lesches, MD sk:cc D: 06/14/2012 49:67:59 ET T: 06/14/2012 21:15:07 ET JOB#: [id number]  cc: Epifanio Lesches, MD, <Dictator> Epifanio Lesches MD ELECTRONICALLY SIGNED 06/17/2012 22:14

## 2014-08-26 NOTE — Discharge Summary (Signed)
PATIENT NAME:  [name], [name] MR#:  [id number] DATE OF BIRTH:  03-May-1951  DATE OF ADMISSION:  10/07/2012 DATE OF DISCHARGE:  10/12/2012  DISCHARGE DIAGNOSIS: 1.  Generalized weakness. 2.  Frequent falls. 3.  Urinary tract infection 4.  Hepatic encephalopathy.  5.  Pediculosis capitis.  6.  Rhabdomyolysis. 7.  Hypertension.   CODE STATUS: FULL CODE.  CONDITION: Stable.  HOME MEDICATIONS:  Enalapril 10 mg p.o. b.i.d. Synthroid 100 mcg p.o. daily, aspirin 81 mg p.o. daily.   DIET: Low-sodium diet.   ACTIVITY: As tolerated.   FOLLOWUP CARE: Follow up with PCP within 1 to 2 weeks. The patient needs to use lice shampoo.   REASON FOR ADMISSION: Generalized weakness, found on the floor,  frequent falls recently.   HOSPITAL COURSE: The patient is a 64 year old Caucasian female with a history of hypothyroidism, hypertension, cardiomyopathy, was sent to ED due to generalized weakness and frequent falls recently. The patient was slightly confused. She was found to have a skin laceration and had severe hypothyroidism. The patient was supposed to take her Synthroid. She had a similar presentation in January. For detailed history and physical examination, please refer to the admission note dictated by Dr. Lenore Manner. After admission, the patient was noted to have a urinary tract infection and has been treated with Rocephin IVPB. In addition, the patient has rehydrated with normal saline IV. For pediculosis capitis, the patient was shaved of hair and treated with lice shampoo. The patient also has rhabdomyolysis, which was treated with normal saline. The patient has no complaints after above-mentioned treatment, but still has generalized weakness, unable to walk properly, and case manager tried to get her insurance authorization for nursing home placement. The patient is clinically stable and will be discharged to a skilled nursing facility today. I discussed the patient's discharge plan with the patient  and the case manager and nurse.   TIME SPENT: About 38 minutes  ____________________________ Demetrios Loll, MD qc:st D: 10/12/2012 16:29:00 ET T: 10/12/2012 17:31:10 ET JOB#: [id number]  cc: Demetrios Loll, MD, <Dictator> Demetrios Loll MD ELECTRONICALLY SIGNED 10/14/2012 15:24

## 2014-08-26 NOTE — H&P (Signed)
PATIENT NAME:  [name], PANIK MR#:  [id number] DATE OF BIRTH:  1950/05/12  DATE OF ADMISSION:  05/20/2012  PRIMARY CARE PHYSICIAN:  She has no primary care physician. REFERRING PHYSICIAN:  Lurline Hare, MD.  CHIEF COMPLAINT: Generalized weakness and frequent falls for the last 2 weeks.   HISTORY OF PRESENT ILLNESS: The patient is a 64 year old Caucasian female who lives at home alone. She had stopped all her medications about 2 years ago and did not follow-up with any physician.   Her past medical history includes idiopathic cardiomyopathy, at one time her ejection fraction was 20%, however, upon last follow-up echocardiogram in 2011 it showed ejection fraction of 50% to 55%.  History of systemic hypertension and degenerative joint disease.  The patient presented to the Emergency Department with generalized weakness associated with frequent falls stating he stating that she is falling every day or every other day for the last couple of weeks. She also indicates that her legs are not holding her well.    REVIEW OF SYSTEMS:  CONSTITUTIONAL: Denies having any fever. No chills but she has fatigue.  EYES: No blurring of vision, no double vision, but she states that she cannot see well when she wakes up in the morning but then she will be fine.  ENT: No hearing impairment. No sore throat. No dysphagia.  CARDIOVASCULAR: No chest pain. No shortness of breath. No syncope. She only falls but no loss of consciousness.  RESPIRATORY: No shortness of breath. No cough. No sputum production.  GASTROINTESTINAL: No abdominal pain, no vomiting and no diarrhea.  GENITOURINARY: No dysuria. No frequency of urination.  MUSCULOSKELETAL: No joint pain other than her chronic back pain. No joint swelling. No muscular pain or swelling.  INTEGUMENTARY: No skin rash. No ulcers.  NEUROLOGY: No focal weakness. No seizure activity. No headache.  PSYCHIATRY: No anxiety. No depression.   ENDOCRINE: No polyuria or polydipsia.  No heat or cold intolerance.  HEMATOLOGY: No easy bruisability. No lymph node enlargement.   PAST MEDICAL HISTORY: 1.  Non-ischemic cardiomyopathy diagnosed at [facility] in 2008 having ejection fraction of 20%; however, subsequent echocardiogram in 2011 showed ejection fraction between 50% to 55%.  2.  Systemic hypertension.  3.  Hypothyroidism.  At one time she stopped her thyroid medicine and her TSH was more than 100. The patient has history of noncompliance with medications.  4.  Chronic back pain and herniated disk disease and moderate spinal stenosis at level L2 and  L3.  5.  History of peptic ulcer disease. 6.  Ongoing tobacco abuse.   PAST SURGICAL HISTORY:  Hysterectomy.   SOCIAL HISTORY: The patient is divorced. She lives at home alone.   SOCIAL HABITS:  Chronic smoker of 1/2 pack will last her a day or 2. She is stating that she is cutting down because she cannot afford it. No history of alcohol or other drug abuse.   FAMILY HISTORY: Her father is alive, but he has some heart problems currently and did not specify. Her mother is deceased, she died from lung cancer.   ADMISSION MEDICATIONS:  None apart from taking something for pain but not every day. In the past, I am going back in the records in 2011, at that time she was on enalapril, Toprol-XL 50, Pravachol, Synthroid 75 mcg, omeprazole and iron supplementation. Again, the patient has stopped all these medications for more than 2 years.   ALLERGIES:  CODEINE CAUSING SKIN RASH.  PHYSICAL EXAMINATION:  VITAL SIGNS:  Blood pressure 157/75,  respiratory 20, pulse 78, temperature 97.9, oxygen saturation 97%.  GENERAL APPEARANCE: This is an elderly lady lying in bed in no acute distress.  HEAD AND NECK: No pallor. No icterus. No cyanosis.  ENT: Ear examination revealed normal hearing, no discharge, no lesions. Nasal mucosa was normal without ulcers. No discharge. No bleeding. Oropharyngeal area was normal without exudate. No oral thrush.    EYES: Normal eyelids except for mild blepharitis or redness of the eyelids. No discharge. Normal conjunctiva. Pupils about 4 to 5 mm, round, equal and sluggishly reactive to light.  NECK: Supple. Trachea at midline. No thyromegaly. No cervical lymphadenopathy. No masses. No masses.  HEART: Normal S1, S2. No S3, S4. No murmur. No gallop.  Heart sounds are very difficult to auscultate. No carotid bruits.  LUNGS: Decreased breathing sounds bilaterally. No rales. No wheezing.  ABDOMEN: Soft without tenderness. No hepatosplenomegaly. No masses. No hernias.  SKIN: Dry skin, no ulcers. No subcutaneous nodules.  MUSCULOSKELETAL: No joint swelling. No clubbing.  NEUROLOGIC: Cranial nerves II through XII are intact. No focal motor deficit.  PSYCHIATRY: The patient is alert and oriented x 3. Mood and affect were normal.   LABORATORY AND DIAGNOSTIC DATA:  EKG showed normal sinus rhythm at rate of 78 per minute. Left-sided deviation. Chest x-ray showed heart size at upper normal. No effusion, no consolidation. Serum glucose 94. B-type natriuretic peptide was 1916. BUN 11, creatinine 1.1, sodium 134, potassium 3.7, albumin 3.3, AST 38, alkaline phosphatase 193, ALT 16. Troponin less than 0.02. CBC showed white count of 5000, hemoglobin 11.0, hematocrit 34, platelet count 325. Urinalysis was unremarkable except for 22 white blood cells, +3 leukocyte esterase. Arterial blood gas showed a pH of 7.41, pCO2 35, pO2 79.   ASSESSMENT: 1.  Generalized weakness. 2.  Frequent falls. These are either secondary to her generalized weakness or related to her back pain and spinal stenosis. However, we want to rule out systemic problems, in particular hypothyroidism as well. 3.  Urinary tract infection.  4.  Mild hyponatremia.  5.  History of idiopathic cardiomyopathy.  Her last echocardiogram showed improvement and her ejection fraction to 50% to 55% in 2011.  6.  Hypertension.  7.  Hypothyroidism.  8.  Degenerative disk  disease of the lumbar spine at L2-L3 associated with moderate spinal stenosis.  9.  Chronic back pain.  10.  Anemia. Her MCV is elevated.   PLAN: We will admit the patient for observation. She received some IV hydration at the Emergency Department; however, I will keep hep. lock ovoid hydration only orally. I will check TSH to rule out profound hypothyroidism. Treat urinary tract infection with ciprofloxacin pending results of the urine culture. Consult orthopedic team to evaluate her frequent falls to see if there is any association between her back problems and this complaint. I ordered x-ray of the lumbosacral spine. Again I would like to add that the patient has noncompliance.  This was not shown in the past and she is repeating the same thing. She did not take any of her medications for the last 2 years. I will resume her ACE inhibitor, enalapril at the time being. Pain control with either Tylenol or Tylenol with hydrocodone p.r.n.  Given the anemia and the elevated MCV, I will check her B12 level and folate. Additionally, I will check her iron and ferritin since in the past she had iron deficiency anemia and at one point she was receiving iron supplementation.   Time Spent evaluating this patient: More  than 55 minutes.     ____________________________ Clovis Pu. Lenore Manner, MD amd:ct D: 05/20/2012 05:40:18 ET T: 05/20/2012 07:04:26 ET JOB#: [id number]  cc: Clovis Pu. Lenore Manner, MD, <Dictator> Ellin Saba MD ELECTRONICALLY SIGNED 05/20/2012 7:51

## 2014-08-26 NOTE — Consult Note (Signed)
Brief Consult Note: Diagnosis: L2-3 loss of disc space with some foraminal narrowing.   Consult note dictated.   Comments: Patient with global weakness in b/l lower extremities.  Multiple recent falls.  Describes chronic neuropathic pain in both feet.  Pain seems worse on right rather than left.  Physical examination: 4/5 b/l hip flexion, extension, adduction, abduction, quads, hamstrings.  4+/5 TA, GS.  Sensation present L1-S2 but difficult to assess symmetry. Seems to have severe dysesthesia both feet. Also appears to potentially have some dyesthetic sensation L2/L3 dermatomal distribution right side.  Unable to elicit reflexes b/l patella or Achilles tendons.  A/P:  cannot attribute global muscle weakness to lumbar spine.  Continued medical workup recommended.  Recommend follow-up with Dr. Dicie Beam as outpatient when medically stable for consideration of MRI and steroid injection?  vs. referral to spine surgeon.  No further inpatient care needed from orthopaedic standpoint.  No orthopaedic restriction to weight bearing.  Electronic Signatures: Dawayne Patricia (MD)  (Signed 15-Jan-14 13:43)  Authored: Brief Consult Note   Last Updated: 15-Jan-14 13:43 by Dawayne Patricia (MD)

## 2014-08-26 NOTE — Consult Note (Signed)
PATIENT NAME:  [name], [name] MR#:  [id number] DATE OF BIRTH:  Oct 26, 1950  DATE OF CONSULTATION:  05/21/2012  REFERRING PHYSICIAN:   CONSULTING PHYSICIAN:  Dawayne Patricia, MD  CHIEF COMPLAINT: Generalized muscle weakness, low back pain.   HISTORY OF PRESENT ILLNESS: Ms. Minchew is a 64 year old female who was admitted for frequent falls and generalized muscle weakness. She is currently being worked up for muscular weakness secondary to her thyroid disease. The patient states that she has fallen many times recently due to this weakness. She describes chronic sensory alteration in both of her feet. She states that the weakness in her right side and the pain in her right seems somewhat worse than in the left.   She does also complain of pain in the lumbar spine. She states that several years ago she had  seen Dr. Mauri Pole and was scheduled for L2-L3 laminectomy versus fusion. The patient is unsure of the specific procedure. She states that due to a change in insurance she had to cancel her surgery and has not rescheduled.   PHYSICAL EXAMINATION: The patient has bilateral weakness on hip flexion, extension, adduction and abduction. She has bilateral decrease in strength of her knee flexion and extension. All of the aforementioned muscle groups measure approximately 4 out of 5 motor strength. They are symmetric bilaterally. TA and GS are 4+ out of 5. The patient has sensation present from L1 to S2. It is quite difficult to assess the symmetry of her sensation. With multiple attempts at testing the patient's response differs. She does seem to consistently have severe dysesthesias on both feet throughout. She also appears to have some dysesthetic sensation in the L2 and L3 dermatomal distributions, on the right side. I am unable to elicit reflexes bilaterally, in her patella or Achilles tendon. She has tenderness to palpation throughout her entire lumbar spine, bilateral SI joints and sacrum.   IMAGING  STUDIES: Radiographs of the lumbar spine were reviewed. They demonstrate near complete loss of disk space between the L2 and L3 vertebral bodies and there is some mild lateral subluxation of L2 on L3.      ASSESSMENT:  1. Generalized muscle weakness. This is unlikely secondary to lumbar spine pathology. The weakness is somewhat inconsistent in testing. It appears rather global through the bilateral lower extremities.  2. Severely degenerative disk, L2/L3.  3. Possible right-sided nerve root impingement.  PLAN: Recommend continued medical work-up for global muscular weakness. As an outpatient, the patient should follow up with Dr. Leanord Hawking. When she is medically stable, she can go forth with a MRI and consideration for corticosteroid injection versus referral to a spine surgeon. The patient was made aware that Dr. Mauri Pole is no longer with the [facility].   No further inpatient care is needed from orthopedic standpoint. There is no orthopedic restriction to weight bearing. With the patient's bilateral lower extremity weakness, however, she will likely require a walker for assistance. ____________________________ Dawayne Patricia, MD sr:sb D: 05/21/2012 09:50:21 ET T: 05/21/2012 10:03:27 ET JOB#: [id number]  cc: Dawayne Patricia, MD, <Dictator> Dawayne Patricia MD ELECTRONICALLY SIGNED 06/03/2012 15:22

## 2014-08-27 NOTE — Consult Note (Signed)
PATIENT NAME:  [name], [name] MR#:  [id number] DATE OF BIRTH:  01/17/1951  DATE OF CONSULTATION:  02/11/2014  REFERRING PHYSICIAN:  Vipul S. Manuella Ghazi, MD CONSULTING PHYSICIAN:  A. Lavone Orn, MD   CHIEF COMPLAINT: Hypothyroidism.   HISTORY OF PRESENT ILLNESS: This is a 64 year old female with a long history of primary hypothyroidism, who has been hospitalized for acute on chronic systolic congestive heart failure exacerbation. She was hospitalized on 02/08/2014, at which time labs revealed an elevated TSH of 45.4 uIU/mL. She states she has been prescribed levothyroxine 125 mcg per day. However, she was running low on her medication and elected to either skip a dose every other day or take a half dose. She has been taking a reduced dose from that prescribed for at least the past 2 months. She used to see Dr. Raye Sorrow at [facility]; however, Dr. Ellene Route left that practice and Mrs. Pfahler has not yet established with a new physician. She reports dry skin. She has felt cold. She has felt constipated. She reports a 5-6 pound weight gain. However, she suspects this is due to excessive fluids. She has been fatigued. Appetite is poor. Shortness of breath has improved over the course of the hospitalization. A repeat TSH level today was 96.4 with a low free T4 of 0.53. It was noted that on 02/09/2014, the day after she was hospitalized, she was given levothyroxine 25 mcg only. Yesterday morning, 02/10/2014, she was given 125 mcg. Labs were drawn this morning, prior to an additional dose of levothyroxine being given.   PAST MEDICAL HISTORY:  1.  Primary hypothyroidism.  2.  Congestive heart failure.  3.  Hypertension.  4.  Chronic back pain.  5.  Hyperlipidemia.  6.  Peptic ulcer disease.  7.  Tobacco dependence.   PAST SURGICAL HISTORY:  Hysterectomy.   SOCIAL HISTORY: The patient smokes 1/2 pack of cigarettes per day. Denies use of alcohol. Lives alone.   FAMILY HISTORY: She has  1 niece with hypothyroidism. Mother passed away from lung cancer. No family members with diabetes. Several aunts and uncles with history of malignancy, including brain, lung, and pancreatic.   ALLERGIES: CODEINE.   CURRENT MEDICATIONS:  1.  Levothyroxine 125 mcg per day.  2.  Enalapril 5 mg per day.  3.  Lexapro 5 mg per day.  4.  Lasix 40 mg daily.  5.  Spironolactone 12.5 mg daily.   REVIEW OF SYSTEMS:  GENERAL: She has had weight gain, as per HPI. She has had fatigue.  HEENT: Denies blurred vision. Denies sore throat.  NECK: Denies neck pain. Denies dysphagia.  CARDIAC: Denies chest pain. Denies palpitation.  PULMONARY: She has had a dry cough, persistent and chronic. Shortness of breath is improving.  ABDOMEN: Reports diminished appetite. Reports constipation.  EXTREMITIES: Denies lower extremity swelling at this time. Denies focal myalgias. Reports chronic back pain and denies heat intolerance. Reports cold intolerance.  SKIN: Denies rash or recent skin changes.   PHYSICAL EXAMINATION:  VITAL SIGNS: Height 65 inches, weight 138 pounds. Temperature 97.8, pulse 82, respirations 18, blood pressure 144/87, pulse oximetry 89% on room air.  GENERAL: Elderly white female in no acute distress.  HEENT: EOMI, oropharynx is clear.  NECK: Supple. No thyromegaly.  CARDIAC: Regular rate and rhythm without murmur.  PULMONARY: Clear to auscultation bilaterally. No wheeze.  ABDOMEN: Diffusely soft, nontender, nondistended.  EXTREMITIES: No peripheral edema is present.  SKIN: She has dryness throughout. No acute rash present.  PSYCHIATRIC: Calm,  cooperative, alert and oriented.  NEUROLOGIC: No dysarthria. No tremor of outstretched hands.   LABORATORY: Significant labs noted in HPI.   ASSESSMENT:  1.  Primary hypothyroidism, uncontrolled.  2.  Medication noncompliance.  3.  Agree with current dose of levothyroxine 125 mcg daily.  4.  We will plan to have her follow up in clinic in 2-3  weeks. I plan to recheck thyroid labs at that point to ensure they are normalizing. Although TSH may not have reached its normal steady stated that point, at least we will be reassured if it has improved.  5.  Encourage the patient to establish care with a primary care physician in the meantime. I can confirm this is done when she is seen in followup.   Thank you for the kind request for consultation.    ____________________________ A. Lavone Orn, MD ams:MT D: 02/11/2014 12:43:43 ET T: 02/11/2014 13:14:03 ET JOB#: [id number]  cc: A. Lavone Orn, MD, <Dictator> Sherlon Handing MD ELECTRONICALLY SIGNED 03/01/2014 17:49

## 2014-08-27 NOTE — Consult Note (Signed)
General Aspect Primary Cardiologist:  Previously [facility] _____________  Pt Profile:  64 y/o female with a prior h/o NICM who was admitted 2/2 progressive dyspnea and edema, and has been found to have an EF of 20-25% with severe MR.Cardiology was consulted for  _____________  Past Medical History ??? NICM (nonischemic cardiomyopathy)    a. 2008 Echo: EF 20% [facility]);  b. 2011 Echo: EF 50-55%;  c. 02/2014 Echo: EF 20-25%, mod dil LA/RA, severe MR, mod-sev TR. ??? Chronic systolic CHF (congestive heart failure)    a. 02/2014 Echo: EF 20-25%. ??? HTN (hypertension)  ??? Hyperlipidemia  ??? Tobacco abuse  ??? Hypothyroidism    a. 02/2014 TSH 96.4. ??? Spinal stenosis  ??? Chronic back pain  ??? Severe mitral regurgitation    a. 02/2014 Echo: Sev MR. ??? Tricuspid regurgitation    a. 02/2014 Echo: Mod-Sev TR. ??? PUD (peptic ulcer disease)  _____________  Past Surgical History ??? Vaginal hysterectomy    _____________ Family History ??? Lung cancer Mother    deceased. ??? CAD               Father                             s/p CABG @ 38, alive @ 66.  _____________ Social History ??? Marital Status: Divorced   Spouse Name: N/A   Number of Children: N/A ??? Years of Education: N/A   Social History Main Topics ??? Smoking status: Current Every Day Smoker -- 0.50 packs/day for 40 years   Types: Cigarettes  Social History Narrative  Lives in [place] by herself.  Does not routinely exercise.  Sometimes uses cane to ambulate. _____________   Present Illness 64 y/o female with the above complex problem list.   She has a h/o MI in 2007 and says that she underwent cath @ [facility] revealing nl cors.  She was dx with NICM and medically managed.  Per chart, EF was initally 20% but improved to 50-55% by 2011.  She has not seen cardiology in several years.  She says that she was in her usual state of health until about 2 wks ago.  She [place] and  says that maybe she overdid it on salt.  While there, she noted increasing abd girth and gradual wt gain.  When she returned home last Friday, she was very short of breath with minimal activity.  She has lasix to use on a prn basis and took one Friday night with good diuresis and drop in wt by six lbs.  She continued to feel dyspneic and orthopneic and for that reason presented to [facility] on Tuesday for further eval.  Here, BNP was elevated @ 21K and CXR showed CHF w/ interstitial edema.  She was admitted and placed on IV lasix with good diuresis and stable renal fxn.  She has been hypokalemic/magnesemic and has required supplementation.  She is minus 4L and wt is down 3 lbs since admission.  She is breathing much better.  She has had intermittent, throbbing chest pain, which can last days at a time.  Troponin was mildly elevated with a flat trend.  TSH was found to be markedly elevated @ 96.4 and synthroid has been adjusted.  Echo was done on 10/7 revealing and EF of 20-25% with sev MR and mod-sev TR.  We have been asked to eval.  Currently she has  no complaints.   Physical Exam:  GEN well developed, pleasant, nad.   HEENT hearing intact to voice, moist oral mucosa   NECK supple  no bruits/jvd.   RESP normal resp effort  bibasilar crackles.   CARD Regular rate and rhythm  Normal, S1, S2  No murmur   ABD denies tenderness  soft  normal BS   EXTR negative cyanosis/clubbing, negative edema   SKIN normal to palpation   NEURO cranial nerves intact, motor/sensory function intact   PSYCH alert, A+O to time, place, person, good insight   Review of Systems:  Subjective/Chief Complaint SOB   General: Fatigue  Weakness   Skin: No Complaints   ENT: No Complaints   Eyes: No Complaints   Neck: No Complaints   Respiratory: Frequent cough  Short of breath  orthopnea prior to admission.   Cardiovascular: Dyspnea  Orthopnea  Inc abd girth prior to admission.  Intermittent throbbing c/p.    Gastrointestinal: No Complaints   Genitourinary: No Complaints   Vascular: No Complaints   Musculoskeletal: No Complaints   Neurologic: No Complaints   Hematologic: No Complaints   Endocrine: No Complaints   Psychiatric: No Complaints   Review of Systems: All other systems were reviewed and found to be negative   Medications/Allergies Reviewed Medications/Allergies reviewed   Family & Social History:  Family and Social History:  Family History Coronary Artery Disease    Social History positive  tobacco   + Tobacco Current (within 1 year)    Place of Living Home        heart arrhythmia:    CHF:    osteoporosis:    arthritis:    hyperthyroidism:    anxiety:    disc problems:    anemia:    Hypothyroidism:    Osteoporosis:    chf:    htn:    appendectomy: 1995   "bladder tac':    Hysterectomy - Total:          Admit Diagnosis:   CHF EXACERBATION.: Onset Date: 11-Feb-2014, Status: Active, Description: CHF EXACERBATION.  Home Medications: Medication Instructions Status  metoprolol 1 tab(s) orally once a day Active  enalapril 5 mg oral tablet 1 tab(s) orally 2 times a day Active  Lasix 1 tab(s) orally once a day, As Needed Active  Lexapro 5 mg oral tablet 1 tab(s) orally once a day Active  gabapentin 1 cap(s) orally once a day (in the evening) Active  gabapentin 2 cap(s) orally once a day (at bedtime) Active  acetaminophen-HYDROcodone 325 mg-10 mg oral tablet 1 tab(s) orally every 6 hours, As Needed Active  Synthroid 25 mcg (0.025 mg) oral tablet 1 tab(s) orally once a day Active   Lab Results:  Thyroid:  09-Oct-15 03:40   Thyroxine, Free  0.53 (Result(s) reported on 11 Feb 2014 at 07:20AM.)  Thyroid Stimulating Hormone  96.4 (0.45-4.50 (IU = International Unit)  ----------------------- Pregnant patients have  different reference  ranges for TSH:  - - - - - - - - - -  Pregnant, first trimetser:  0.36 - 2.50 uIU/mL)  Routine Chem:   09-Oct-15 03:40   Magnesium, Serum  1.6 (1.8-2.4 THERAPEUTIC RANGE: 4-7 mg/dL TOXIC: > 10 mg/dL  -----------------------)  Glucose, Serum 83  BUN 15  Creatinine (comp) 0.96  Sodium, Serum 138  Potassium, Serum  3.2  Chloride, Serum  95  CO2, Serum  37  Calcium (Total), Serum 8.6  Anion Gap  6  Osmolality (calc) 276  eGFR (African American) >  60  eGFR (Non-African American) >60 (eGFR values <15m/min/1.73 m2 may be an indication of chronic kidney disease (CKD). Calculated eGFR, using the MRDR Study equation, is useful in  patients with stable renal function. The eGFR calculation will not be reliable in acutely ill patients when serum creatinine is changing rapidly. It is not useful in patients on dialysis. The eGFR calculation may not be applicable to patients at the low and high extremes of body sizes, pregnant women, and vetetarians.)  Cardiac:  06-Oct-15 16:36   Troponin I  0.08 (0.00-0.05 0.05 ng/mL or less: NEGATIVE  Repeat testing in 3-6 hrs  if clinically indicated. >0.05 ng/mL: POTENTIAL  MYOCARDIAL INJURY. Repeat  testing in 3-6 hrs if  clinically indicated. NOTE: An increase or decrease  of 30% or more on serial  testing suggests a  clinically important change)    20:20   Troponin I  0.08 (0.00-0.05 0.05 ng/mL or less: NEGATIVE  Repeat testing in 3-6 hrs  if clinically indicated. >0.05 ng/mL: POTENTIAL  MYOCARDIAL INJURY. Repeat  testing in 3-6 hrs if  clinically indicated. NOTE: An increase or decrease  of 30% or more on serial  testing suggests a  clinically important change)  07-Oct-15 00:56   Troponin I  0.06 (0.00-0.05 0.05 ng/mL or less: NEGATIVE  Repeat testing in 3-6 hrs  if clinically indicated. >0.05 ng/mL: POTENTIAL  MYOCARDIAL INJURY. Repeat  testing in 3-6 hrs if  clinically indicated. NOTE: An increase or decrease  of 30% or more on serial  testing suggests a  clinically important change)   EKG:  EKG Interp. by me    Interpretation EKG shows rsr, 97, no acute st/t changes.   Radiology Results: XRay:    08-Oct-15 07:05, Chest PA and Lateral  Chest PA and Lateral   REASON FOR EXAM:    sob  COMMENTS:       PROCEDURE: DXR - DXR CHEST PA (OR AP) AND LATERAL  - Feb 10 2014  7:05AM     CLINICAL DATA:  shortness of breath and midsternal chest pain, pt  was admitted to the hospital on _0 .0 in  Patient Age:    89years  Weight:       141.0 lb  Patient Gender: F                      BSA:          1.71 m??    Indications: CHF  Sonographer:    Sherrie Sport RDCS  Referring Phys: Vaughan Basta    Summary:   1. Left ventricular ejection fraction, by visual estimation, is 20 to   25%.   2. Severely decreased global left ventricular systolic function.   3. Moderately increased left ventricular internal cavity size.   4. Moderately dilated left atrium.   5. Moderately  dilated right atrium.   6. Severe mitral valve regurgitation.   7. Moderate to severe tricuspid regurgitation.   8. Moderately increased left ventricular posterior wall thickness.  2D ANDM-MODE MEASUREMENTS (normal ranges within parentheses):  Left Ventricle:          Normal  IVSd (2D):      1.16 cm (0.7-1.1)  LVPWd (2D):     1.49 cm (0.7-1.1) Aorta/LA:                  Normal  LVIDd (2D):     5.10 cm (3.4-5.7) Aortic Root (2D): 2.50cm (2.4-3.7)  LVIDs (2D):     4.39 cm           Left Atrium (2D): 5.10 cm (1.9-4.0)  LV FS (2D):     13.9 %   (>25%)  LV EF (2D):     29.6 %   (>50%)                                    Right Ventricle:                                    RVd (2D):   5.73 cm  LV SYSTOLIC FUNCTION BY 2D PLANIMETRY (MOD):  EF-A4C View: 33.6 % EF-A2C View: 14.1 % EF-Biplane: 22.0 %  LV DIASTOLIC FUNCTION:  MV Peak E: 1.21 m/s E/e' Ratio: 23.00  MV Peak A: 0.70 m/s Decel Time: 176 msec  E/A Ratio: 1.74  SPECTRAL DOPPLER ANALYSIS (where applicable):  Mitral Valve:  MV P1/2 Time: 51.04 msec  MV Area, PHT: 4.31 cm??  Mitral Insufficiency by PISA:  MR Volume: 78.16 ml MR Flow Rate: 284.00 ml/s MR EROA: 50 mm??  Aortic Valve: AoV Max Vel: 1.12 m/s AoV Peak PG: 5.0 mmHg AoV Mean PG:  LVOT Vmax: 0.85 m/s LVOT VTI:  LVOT Diameter: 2.00 cm  AoV Area, Vmax: 2.39 cm?? AoV Area, VTI:  AoV Area, Vmn:  Tricuspid Valve and PA/RV Systolic Pressure: TR Max Velocity: 2.15 m/s RA   Pressure: 5 mmHg RVSP/PASP: 23.5 mmHg  Pulmonic Valve:  PV Max Velocity: 0.86 m/s PV Max PG: 2.9 mmHg PV Mean PG:    PHYSICIAN INTERPRETATION:  Left Ventricle: The left ventricular internal cavity size was moderately   increased. LV posterior wall thickness was moderately increased. Global     LVsystolic function was severely decreased. Left ventricular ejection   fraction, by visual estimation, is 20 to 25%.  Right Ventricle: Normal right ventricular size, wall thickness, and   systolic function. RV wall  thickness is normal.  Left Atrium: The left atrium is moderately dilated.  Right Atrium: The right atrium is  moderately dilated.  Pericardium: There is no evidence of pericardial effusion.  Mitral Valve: Severe mitral valve regurgitation is seen.  Tricuspid Valve: Moderate to severe tricuspid regurgitation is   visualized. The tricuspid regurgitant velocity is 2.15 m/s, and with an   assumed right atrial pressure of 5 mmHg, the estimated right ventricular   systolic pressure is normal at 23.5 mmHg.  Aortic Valve: The aortic valve istrileaflet and structurally normal,   with normal leaflet excursion; without any evidence of aortic stenosis or   insufficiency.  Aorta: The aortic root and ascending aorta are structurally normal, with   no evidence of dilitation.    Knik-Fairview MD  Electronically signed by [id number] Serafina Royals MD  Signature Date/Time: 02/09/2014/5:28:08 PM    *** Final ***    IMPRESSION: .        Verified By: Corey Skains  ([facility]), M.D., MD    Codeine: Hives  Vital Signs/Nurse's Notes:  **Vital Signs.:   09-Oct-15 04:20  Vital Signs Type Routine  Temperature Temperature (F) 98    04:20  Celsius 36.6    04:20  Temperature Source oral  Pulse Pulse 77  Respirations Respirations 20  Systolic BP Systolic BP 627  Diastolic BP (mmHg) Diastolic BP (mmHg) 74  Mean BP 93  Pulse Ox % Pulse Ox % 93  Pulse Ox Activity Level  At rest  Oxygen Delivery 1L    04:30  Pulse Ox % Pulse Ox % 84  Pulse Ox Activity Level  With exertion; walked around nursing station once.  Oxygen Delivery Room Air/ 21 %    Impression 1.  Acute on chronic systolic CHF/NICM:   h/o NICM dating back to 2007 with reported improvement in LV fxn by 2011.    nl cath in 2007.  She presented to [facility] on 10/6 with a 1+ wk h/o progressive doe/orthopnea/inc abd girth and was found to be markedly volume overloaded with elevated BNP (21K) and CHF on CXR.  She has now diuresed 4L and wt is  down 3 lbs.  She has bibasilar crackles but otw looks euvolemic on exam and wt is in prior dry wt range (135-138 per pt).   Echo on 10/7 shows recurrent LV dysfxn (EF 20-25%) and severe MR.  Troponin has been mildly elevated with a flat trend.  She has had intermittent throbbing chest pain.   ------>Suspect secondary to hypothyroidism, less likely ischemia given prior negative cath Consider converting lasix to 22m PO daily.   Add spironolactone in setting of LV dysfxn and ongoing hypokalemia.   Add low-dose carvedilol.  Cont ACEI.   She will need ischemic evaluation as it has been 8 years since her last one.  Plan for [facility] as outpt.  2.  Severe MR:  She does not have any significant murmur on exam.  ? if MR functional in the setting of LV dysfxn, elevated filling pressures, and volume overload.   --Repeat echo as an outpt after diuresis  3.  HTN:   BP has been stable.  Follow with addition of low dose coreg and spiro.  4.  Tob Abuse:   Complete cessation advised.  5.  Troponin Elevation:   Likely non-specific in the setting of volume overload/CHF.  Flat trend with peak of 0.08.    with LV dysfxn, would plan ischemic eval.outpt  6.  Hypothyroidism:   Synthroid increased from 75 to 125 mcg this admission.  Further f/u per IM/Endocrine.  7.  Hypokalemia/Hypomagnesemia:  Ongoing supplementation.  Add spiro 12.40m daily.  F/U bmet as outpt.   Electronic Signatures: BRogelia Mire(NP)  (Signed 09-Oct-15 10:24)  Authored: General Aspect/Present Illness, History and Physical Exam, Review of System, Home Medications, Labs, EKG , Radiology, Allergies, Vital Signs/Nurse's Notes, Impression/Plan GIda Rogue(MD)  (Signed 09-Oct-15 12:03)  Authored: General Aspect/Present Illness, History and Physical Exam, Review of System, Family & Social History, Past Medical History, Health Issues, Labs, EKG , Vital Signs/Nurse's Notes, Impression/Plan  Co-Signer: General Aspect/Present  Illness, Home Medications, Allergies   Last Updated: 09-Oct-15 12:03 by GIda Rogue(MD)

## 2014-08-27 NOTE — Discharge Summary (Signed)
PATIENT NAME:  [name], [name] MR#:  [id number] DATE OF BIRTH:  1950-10-27  DATE OF ADMISSION:  03/28/2014 DATE OF DISCHARGE:  04/02/2014  ADMITTING DIAGNOSIS: Shortness of breath, cough.   DISCHARGE DIAGNOSES:  1.  Shortness of breath and cough due to acute hypoxic respiratory failure due to acute systolic congestive heart failure exacerbation as well as possible acute bronchitis.  2.  Acute bronchitis, now improved with antibiotics.  3.  Elevated troponin, felt to be due to demand ischemia, recent cardiac catheterization with minor irregularities.  4.  Hypothyroidism.  5.  Depression.  6.  Nicotine addiction.  7.  Severe hypokalemia.  8.  Peripheral vascular disease.  9.  Hypertension.  10.  Chronic back pain with a history of moderate spinal stenosis.  11.  Severe peripheral vascular disease, awaiting intervention in the coming week.  12.  Peptic ulcer disease.  13.  Status post partial hysterectomy.  PERTINENT LABORATORIES AND EVALUATIONS: PA chest x-ray shows congestive heart failure. WBC 9200, hemoglobin 11.1, platelets 449,000. Troponin 0.13. Glucose 110, BUN 7, creatinine 0.86, sodium 148, potassium was 2.8, CO2 of 29. Chest x-ray with findings consistent with congestive heart failure.   CONSULTANTS: Muhammad A. Fletcher Anon, [place]: Please refer to H and P done by the admitting physician. The patient is a 64 year old white female with history of nonischemic cardiomyopathy with EF 20% who presented with shortness of breath and cough. The patient was seen in the ED and was noted to have hypoxia, was started on Lasix for her congestive heart failure. She was also noticed to have acute bronchitis, which was treated with antibiotics. The patient was slow to improve. She was seen by cardiology. We adjusted her diuresis. She was also hypotensive so had to hold the medications at certain points. The patient does have chronic respiratory failure and is on chronic oxygen. At this time,  her breathing is stable and she is also referred to the [facility].   DISCHARGE MEDICATIONS: Carvedilol 3.125 two tablets b.i.d., Tussionex 5 mL q. 12 as needed, gabapentin 300 one tablet p.o. at bedtime, acetaminophen/hydrocodone 325/10 one to two tablets q. 6 p.r.n., levothyroxine 125 mcg daily, spironolactone 12.5 one tablet p.o. b.i.d., enalapril 2.5 p.o. daily, Lasix 20 one tablet p.o. b.i.d., digoxin 125 mcg daily, aspirin 325 p.o. daily, guaifenesin 600 one tablet p.o. b.i.d.   HOME OXYGEN: Two liters nasal cannula.   HOME HEALTH SERVICES: With nurse and nurse aide.   ACTIVITY: As tolerated.   FOLLOWUP: With primary MD in 1 to 2 weeks. Follow up with Dr. Rockey Situ in 1 to 2 weeks. Dr. Fletcher Anon for vascular intervention next week.   TIME SPENT: Thirty-five minutes on the patient.    ____________________________ Lafonda Mosses. Posey Pronto, MD shp:TT D: 04/03/2014 12:42:48 ET T: 04/03/2014 19:57:43 ET JOB#: [id number]  cc: Sehar Sedano H. Posey Pronto, MD, <Dictator> Alric Seton MD ELECTRONICALLY SIGNED 04/09/2014 8:37

## 2014-08-27 NOTE — H&P (Signed)
PATIENT NAME:  [name], POULTON MR#:  [id number] DATE OF BIRTH:  December 18, 1950  DATE OF ADMISSION:  02/08/2014  PRIMARY CARE PHYSICIAN: None.   REFERRING EMERGENCY ROOM PHYSICIAN: Dr. Harvest Dark.    CHIEF COMPLAINT: Shortness of breath.   HISTORY OF PRESENT ILLNESS: A 64 year old female who has a past history of hypertension, hyperlipidemia, nonischemic cardiomyopathy and CHF, chronic back pain and spinal stenosis, hypothyroidism. She had her primary care physician at [facility] but the doctor moved out and referred her to a new doctor, but the new doctor was not giving her all the pain medications which she was getting by her previous doctor for her chronic back pain and was referring her to other clinics and she did not like that, as she does not want to pay so many co-pays and so she is basically not going to any primary care doctor for the last 4-5 months.  Her previous PMD gave her prescriptions to last for 3-4 months when she moved out and she just ran out of those prescriptions now a week to 2 ago.  Currently she came to the hospital because for the last 1 week she started noticing swelling on her legs and she was feeling more short of breath which started now some chest pain also. For many years she has to sleep using 3-4 pillows in upright position, there is no change in that, but she feels the shortness of breath and leg edema is because of her heart. On further questioning she denies any cough or sputum production. Denies any fever.    REVIEW OF SYSTEMS:   CONSTITUTIONAL: Negative for fever, fatigue, generalized weakness. Positive for weight gain and  edema.  EYES: No blurring, double vision, discharge or redness.  EARS, NOSE, THROAT: No tinnitus, ear pain, or hearing loss.  RESPIRATORY: No cough, wheezing, hemoptysis, or shortness of breath.  CARDIOVASCULAR: The patient had some chest pain and shortness of breath.  No orthopnea. Had edema on the legs.  ABDOMEN: No nausea, vomiting,  diarrhea, abdominal pain.  GENITOURINARY: No dysuria, hematuria, increased frequency.  ENDOCRINE: No heat or cold intolerance. No excessive sweating on the skin. MUSCULOSKELETAL: No pain or swelling in the joints.  NEUROLOGICAL: No numbness, weakness, tremor, or vertigo.  PSYCHIATRIC: No anxiety, insomnia, bipolar disorder.   PAST MEDICAL HISTORY:  1. Hypothyroidism.  2. Hypertension.  3. Nonischemic cardiomyopathy, in 2008 her ejection fraction was 20% at [facility], but subsequent echocardiogram in 2011 showed improvement in ejection fraction 50-55%.  4. Chronic back pain and history of moderate spinal stenosis level of L2 and L3 and herniated disk.  5. Peptic ulcer disease.  6. Tobacco abuse.   PAST SURGICAL HISTORY:  Hysterectomy.   SOCIAL HABITS: She is a smoker half to 1 pack of cigarettes every day and continues to smoke. No history of alcohol or illegal drug use. She lives alone at home and has to use cane sometimes.   FAMILY HISTORY: Her mother died from lung cancer.   HOME MEDICATIONS:  1. Synthroid 25 mcg once a day.  2. Metoprolol once a day.  3. Lexapro 5 mg oral once a day.   4. Lasix tablet once a day.   5. Gabapentin 1 capsule orally in the evening.  6. Enalapril 5 mg 2 times a day.  7. Acetaminophen and hydrocodone 10 mg oral tablet every 6 hours as needed for pain.    She was not taking her medications regularly as she ran out of them a week ago.  PHYSICAL EXAMINATION:  VITAL SIGNS: In ER temperature 98.1, pulse 93, respirations 21, blood pressure 161/85, and pulse oximetry is 95 on oxygen supplementation.  GENERAL: The patient is fully alert and oriented to time, place, and person. Does not appear in acute distress.  HEENT: Head and neck atraumatic. Conjunctivae pink. Oral mucosa moist.  NECK: Supple. No JVD.  RESPIRATORY: Bilateral equal air entry. Crepitation present on inspiration. No wheezing.  CARDIOVASCULAR: S1, S2 present, regular. No murmur.  ABDOMEN: Soft,  nontender. Bowel sounds present. No organomegaly. SKIN: No rashes.  LEGS: Mild edema present around ankles.   JOINTS: No swelling or tenderness.  NEUROLOGICAL: Power 5 out of 5. No tremor or rigidity. Follows commands.  PSYCHIATRIC: Does not appear in any acute psychiatric illness at this time.   IMPORTANT LABORATORY RESULTS:   1.  Glucose 93, BUN 9, creatinine 0.85, sodium 142, potassium is 2.4, chloride 106, and CO2 30.  2.  Calcium is 8.4.  3.  Total protein 7.1, albumin 3.2, bilirubin 0.5, alkaline phosphatase 137, SGOT 18, and SGPT is 7.   4.   Troponin 0.08.  5.  WBC 8.9, hemoglobin is 11.5, platelet count is 348,000, MCV is 81.  6.  Chest x-ray portable single view shows airspace and interstitial edema   ASSESSMENT AND PLAN: A 64 year old female with past history of nonischemic cardiomyopathy, hypertension, smoker, and noncompliant with medication, came to hospital for shortness of breath and edema for 1 week.   1.  Congestive heart failure exacerbation, most likely systolic. We do not have a recent echocardiogram in the system.  I will get an echocardiogram and will follow serial troponin and monitor on telemetry. We will give IV Lasix and monitor ins and outs. Counseled her for medication compliance and try to find out a PMD as soon as possible so she can continue getting her medications.   2.  Hypertension. Currently blood pressure is slightly high. She will be on IV Lasix 3 times a day so we will just continue enalapril and Lasix, once we cut down Lasix we can add beta blocker.  3.  Hypothyroidism. She was not taking the medicine so we will check TSH level and will continue levothyroxine at home dose.  4.  Hypokalemia: Will replace IV and oral and will check magnesium level and replace if needed.  5.  Chronic back pain. Will continue acetaminophen and oxycodone as needed basis.  6.  Smoking. She is a smoker, counseling done for 4 minutes and offered a nicotine patch, she said she  does not need that, but will continue considering about quitting her smoking habit.    TOTAL TIME SPENT ON THIS ADMISSION: 50 minutes.     ____________________________ Ceasar Lund Anselm Jungling, MD vgv:bu D: 02/08/2014 18:53:44 ET T: 02/08/2014 19:42:54 ET JOB#: [id number]  cc: Ceasar Lund. Anselm Jungling, MD, <Dictator> PMD's office Vaughan Basta MD ELECTRONICALLY SIGNED 02/25/2014 21:00

## 2014-08-27 NOTE — Consult Note (Signed)
General Aspect Primary Cardiologist: Dr. Rockey Situ, MD ________________  64 year old female with NICM (EF 18%), chronic systolic CHF, HTN, HLD, hypothyroidism, PVD, chronic back pain, and peptic ulcer disease who presented to [facility] on 03/28/2014 with a 2 day history of increased dyspnea. She recently underwent cardiac cath on 03/15/2014 that showed diffuse minor luminal irregs, EF 20%. She underwent the cath secondary to abnormal stress test. Upon her arrival to [facility] she was started on po Leavquin and IV Lasix 40 mg q 12 hours. She was continued on her home Coreg 6.25 mg bid, Aldactone 12.5 mg bid, and enalapril 5 mg bid. Troponin was checked and was 0.13. pBNP 83000. CXR CHF, no airspace consolidation. Cardiology was consulted for the above SOB and elevated troponin. _____________  PMH: ???  NICM (nonischemic cardiomyopathy)   a. 2008 Echo: EF 20% [facility]);  b. 2011 Echo: EF 50-55% ([facility]);  c. 02/2014 Echo: EF 20-25%, mod dil LA/RA, severe MR, mod-sev TR. d. cath 03/15/2014 minor luminal irregs, EF 20% ???  Chronic systolic CHF (congestive heart failure)   a. 02/2014 Echo: EF 20-25%, severe MR, tricuspid regurg, mod dilated LA & RA   ??? HTN (hypertension)   ???Hyperlipidemia   ???Tobacco abuse   ???Hypothyroidism   a. 02/2014 TSH 96.4.   Spinal stenosis   ???Chronic back pain   ???PUD (peptic ulcer disease)   ???History of nuclear stress test   a. 02/24/2014: Lexiscan Myoview: no sig ischemia, predominantly mild fixed defect noted in the inf wall on nonattenuation corrected images. On attenuation corrected images small mild perfusion defect in the apical & distal anterior wall w/ possible small mild ischemia. Mod global HK. No EKG changes concerning for ischemia. EF 35%. No artificat noted. Overall, moderate risk study.      Past Surgical History   ???Vaginal hysterectomy   ???Cardiac catheterization   2007 [facility]   ____________________   Present Illness 64 year old female with the above problem  list who presented to [facility] with the above complaint on 03/28/2014.   Patient with history of MI in 2007 and says that she underwent cath @ [facility] revealing nl cors. She was dx with NICM and medically managed. Per chart, EF was initally 20% but improved to 50-55% by 2011. At that time she had not seen cardiology in several years. She says that she was in her usual state of health until an admission to [facility] in early October (10/6-10/9). About 2 wks prior to this admission into the hospital on 10/6 she [place] and says that maybe she overdid it on salt. While there, she noted increasing abdominal girth and gradual weight gain. When she returned home 10/2, she was very short of breath with minimal activity. She has Lasix to use on a prn basis and took one 10/2 PM with good diuresis and drop in wt by six lbs. She continued to feel dyspneic and orthopneic and for that reason presented to [facility] on 10/6 for further eval. At [facility], BNP was elevated @ 21K and CXR showed CHF w/ interstitial edema. She was admitted and placed on IV Lasix with good diuresis and stable renal fxn. TnI was mildly elevated with a flat trend. TSH was found to be markedly elevated at 96.4. Echo was done on 02/09/2014 that showed an EF of 20-25% with severe MR and moderate to severe TR.   She underwent Lexiscan Myoview 02/25/2014 that showed mild fixed defect noted in the  inferior wall on nonattenuation corrected images. Attenuation corrected images shows small mild perfusion defect in the apical and distal anterior wall with possible small mild ischemia in this region. Moderate global HK. LV global function was moderately reduced. No EKG changes concerning for ischemia. EF 35%. Moderate risk study. She underwent cardiac cath on 03/15/2014 secondary to the above stress test that showed minor luminal irregularities. EF 20%. Severe PAD. She was managed medically and case to be discussed with Dr. Fletcher Anon regarding  PAD/caludication.   She followed up as an outpatient on 11/19 and reported having periodic episodes of shortness of breath. She takes extra Lasix, and eventually her symptoms resolve. Denies having significant lower extremity edema. Not very active at baseline. No significant cough, no orthostasis, no chest pain. Right groin has healed, still with a small knot.   She presented to [facility] today with a 2 day history of increased dyspnea. Unable to sleep 2/2 SOB. Having to sit up. Coughing frequently, not productive. No increased LEE. Apetite has been decreased lately. She is uncertain what her weight has been at home, though her weight at her last outpatient vist on 11/19 was 131 and prior that it was 141 on 10/29. She denies any chest pain, diaphoresis, nausea, vomiting, presyncope, or syncope.   Upon her arrival to [facility] she was started on po Leavquin and IV Lasix 40 mg q 12 hours. She was continued on her home Coreg 6.25 mg bid, Aldactone 12.5 mg bid, and enalapril 5 mg bid. Troponin was checked and was 0.13. pBNP 83000. CXR with CHF, no airspace consolidation.   Physical Exam:  GEN well developed, well nourished, no acute distress   HEENT hearing intact to voice, moist oral mucosa   NECK supple  + JVD to ear lobe   RESP normal resp effort  wheezing  rhonchi  crackles   CARD Regular rate and rhythm  Normal, S1, S2  Murmur   Murmur Systolic  2/6   ABD denies tenderness  no hernia  soft   LYMPH negative neck   EXTR negative edema   SKIN normal to palpation   NEURO motor/sensory function intact   PSYCH alert, A+O to time, place, person, good insight   Review of Systems:  Subjective/Chief Complaint SOB   General: Fatigue  Weakness   Skin: No Complaints   ENT: No Complaints   Eyes: No Complaints   Neck: No Complaints   Respiratory: Frequent cough  Short of breath  Wheezing   Cardiovascular: Orthopnea  Edema   Gastrointestinal: No Complaints   Genitourinary: No Complaints    Vascular: Calf pain with walking  Leg cramping   Musculoskeletal: No Complaints   Neurologic: No Complaints   Hematologic: No Complaints   Endocrine: No Complaints   Psychiatric: No Complaints   Review of Systems: All other systems were reviewed and found to be negative   Medications/Allergies Reviewed Medications/Allergies reviewed   Family & Social History:  Family and Social History:  Family History Coronary Artery Disease  Mother: lung CA; father: CAD   Social History negative ETOH, negative Illicit drugs   + Tobacco Current (within 1 year)   Place of Living Home     heart arrhythmia:    CHF:    osteoporosis:    arthritis:    hyperthyroidism:    anxiety:    disc problems:    anemia:    Hypothyroidism:    Osteoporosis:    chf:    htn:    appendectomy: 1995   "  bladder tac':    Hysterectomy - Total:          Admit Diagnosis:   ACUTE SYSTOLIC CONGESTIVE HEART FAILURE: Onset Date: 28-Mar-2014, Status: Active, Description: ACUTE SYSTOLIC CONGESTIVE HEART FAILURE  Home Medications: Medication Instructions Status  enalapril 5 mg oral tablet 1 tab(s) orally 2 times a day Active  spironolactone 25 mg oral tablet 0.5 tab(s) orally 2 times a day Active  carvedilol 3.125 mg oral tablet 2 tab(s) orally 2 times a day Active  Tussionex [employer] 8 mg-10 mg/5 mL oral suspension, extended release 5 milliliter(s) orally every 12 hours, As Needed for cough.  Active  furosemide 40 mg oral tablet 2 tab(s) orally once a day (on Monday, Wednesday, and Friday).  Active  gabapentin 300 mg oral capsule 1 cap(s) orally once a day (at bedtime) Active  acetaminophen-HYDROcodone 325 mg-10 mg oral tablet 1-2 tab(s) orally every 6 hours, As Needed - for Pain Active  levothyroxine 125 mcg (0.125 mg) oral tablet 1 tab(s) orally once a day (in the morning) Active  furosemide 20 mg oral tablet 2 tab(s) orally once a day (on Sunday, Tuesday, Thursday, and Saturday).   Active   Lab Results:  Routine Chem:  23-Nov-15 10:24   Result Comment HCT/HGB check fail - RESULTS VERIFIED BY REPEAT TESTING.  Result(s) reported on 28 Mar 2014 at 11:33AM.  B-Type Natriuretic Peptide [facility])  [phone] (Result(s) reported on 28 Mar 2014 at 11:36AM.)  Glucose, Serum  110  BUN 7  Creatinine (comp) 0.86  Sodium, Serum 141  Potassium, Serum  2.8  Chloride, Serum 103  CO2, Serum 29  Calcium (Total), Serum  8.2  Anion Gap 9  Osmolality (calc) 280  eGFR (African American) >60  eGFR (Non-African American) >60 (eGFR values <71m/min/1.73 m2 may be an indication of chronic kidney disease (CKD). Calculated eGFR, using the MRDR Study equation, is useful in  patients with stable renal function. The eGFR calculation will not be reliable in acutely ill patients when serum creatinine is changing rapidly. It is not useful in patients on dialysis. The eGFR calculation may not be applicable to patients at the low and high extremes of body sizes, pregnant women, and vegetarians.)  Cardiac:  23-Nov-15 10:24   CK, Total 45  CPK-MB, Serum 1.4 (Result(s) reported on 28 Mar 2014 at 04:00PM.)  Troponin I  0.13 (0.00-0.05 0.05 ng/mL or less: NEGATIVE  Repeat testing in 3-6 hrs  if clinically indicated. >0.05 ng/mL: POTENTIAL  MYOCARDIAL INJURY. Repeat  testing in 3-6 hrs if  clinically indicated. NOTE: An increase or decrease  of 30% or more on serial  testing suggests a  clinically important change)  Routine Hem:  23-Nov-15 10:24   WBC (CBC) 9.2  RBC (CBC) 4.70  Hemoglobin (CBC)  11.1  Hematocrit (CBC) 36.5  Platelet Count (CBC)  449  MCV  78  MCH  23.6  MCHC  30.3  RDW  20.1   EKG:  EKG Interp. by me   Interpretation EKG shows sinus tachycardia, 108, left axis, nonspecific st/t changes inferolateral leads   Radiology Results: XRay:    23-Nov-15 10:59, Chest PA and Lateral  Chest PA and Lateral   REASON FOR EXAM:    SOB  COMMENTS:       PROCEDURE: DXR - DXR  CHEST PA (OR AP) AND LATERAL  - Mar 28 2014 10:59AM     CLINICAL DATA:  Chronic difficulty breathing    EXAM:  CHEST  2 VIEW    COMPARISON:  February 10, 2014    FINDINGS:  There is cardiomegaly with pulmonary venous hypertension. There are  small bilateral effusions with generalized interstitial edema. There  is no appreciable airspace consolidation. No adenopathy. There is  atherosclerotic change in the aorta.     IMPRESSION:  Congestive heart failure.  No airspace consolidation.      Electronically Signed    By: Lowella Grip M.D.    On: 03/28/2014 11:15         Verified By: Leafy Kindle. WOODRUFF, M.D.,    Codeine: Hives  Vital Signs/Nurse's Notes: **Vital Signs.:   23-Nov-15 14:44  Vital Signs Type Admission  Temperature Temperature (F) 97.4  Celsius 36.3  Pulse Pulse 101  Respirations Respirations 20  Systolic BP Systolic BP 220  Diastolic BP (mmHg) Diastolic BP (mmHg) 78  Mean BP 94  Pulse Ox % Pulse Ox % 92  Pulse Ox Activity Level  At rest  Oxygen Delivery 3L    Impression 64 year old female with NICM (EF 25%), chronic systolic CHF, HTN, HLD, hypothyroidism, PVD, chronic back pain, and peptic ulcer disease who presented to [facility] on 03/28/2014 with a 2 day history of increased dyspnea. She recently underwent cardiac cath on 03/15/2014 that showed diffuse minor luminal irregs, EF 20%. She underwent the cath secondary to abnormal stress test. Upon her arrival to [facility] she was started on po Leavquin and IV Lasix 40 mg q 12 hours. She was continued on her home Coreg 6.25 mg bid, Aldactone 12.5 mg bid, and enalapril 5 mg bid. Troponin was checked and was 0.13. pBNP 83000. CXR CHF, no airspace consolidation. Cardiology was consulted for the above SOB and elevated troponin.  1. Acute hypoxic respiratory failure: Suspect multifactorial: acute on chronic systolic CHF in setting of  COPD exacerbation, chronic bronchitis -Pulse ox 88% on room air in the ED -Current pBNP  [id number], prior 21532 -Continue with IV Lasix 40 mg bid at current time , replete potassium -Continue enalapril 5 mg bid -Continue Coreg 6.25 mg bid with plan to titrate by discharge to 12.5 mg bid if BP/pulse allow -Continue Aldactone 12.5 mg bid  -Continue Levaquin  2. NICM: -Recent cath 03/15/2014 with minor luminal irregularities, EF 20% -Elevated troponin 2/2 demand ischemia in the setting of the above given her recent nonobstructive cath, no further work up -Consider ICD eval as an outpt  3. Hypokalemia: -Admission K+ 2.8 -Replete KCL 60 meq x 3 doses and follow with diuresis  4. PAD/claudication: -Will need angioplasty, possible stenting of her iliac arterial stenosis  Was planned for next week. -Can be addressed as an outpatient   Electronic Signatures: Rise Mu (PA-C)  (Signed [phone] 18:13)  Authored: General Aspect/Present Illness, History and Physical Exam, Review of System, Family & Social History, Past Medical History, Home Medications, Labs, EKG , Radiology, Allergies, Vital Signs/Nurse's Notes, Impression/Plan Ida Rogue (MD)  (Signed [phone] 22:38)  Authored: General Aspect/Present Illness, History and Physical Exam, Review of System, Family & Social History, Health Issues, EKG , Vital Signs/Nurse's Notes, Impression/Plan  Co-Signer: General Aspect/Present Illness, History and Physical Exam, Review of System, Family & Social History, Past Medical History, Home Medications, Labs, EKG , Radiology, Allergies, Vital Signs/Nurse's Notes, Impression/Plan   Last Updated: 23-Nov-15 22:38 by Ida Rogue (MD)

## 2014-08-27 NOTE — H&P (Signed)
PATIENT NAME:  [name], [name] MR#:  [id number] DATE OF BIRTH:  1951/01/09  DATE OF ADMISSION:  03/28/2014  ADMITTING DIAGNOSIS: Shortness of breath, cough.   HISTORY OF PRESENT ILLNESS: The patient is a 64 year old white female with history of nonischemic cardiomyopathy with ejection fraction of 20% who was last hospitalized here on 02/08/2014 with acute systolic congestive heart failure exacerbation.  The patient also had a cardiac catheterization done on 03/15/2014 which basically showed minor irregularities in the coronaries, but noted to have severe right-sided peripheral vascular disease, who is presenting to the ED with complaint of shortness of breath and cough. The patient states that her shortness of breath has progressively gotten worse over the past few days. She has not gained any weight. She does sleep propped up. She has not had any wheezing. She has had a cough, which is dry. She also complains of intermittent chest pains associated with the shortness of breath. Denies any nausea. She did report that she had some emesis after she coughed very hard. She does have chronic lower extremity swelling, left greater than right. Denies any fevers or chills.   PAST MEDICAL HISTORY:  1. History of systolic congestive heart failure with severe cardiomyopathy with ejection fraction of 20%, nonischemic in nature. 2. Hypothyroidism.  3. Hypertension.  4. Chronic back pain with a history of moderate spinal stenosis.  5. Severe peripheral vascular, disease awaiting intervention.  6. Peptic ulcer disease.  7. History of tobacco abuse. She reports that she stopped smoking.   PAST SURGICAL HISTORY: Status post partial hysterectomy.   ALLERGIES: Codeine.   SOCIAL HISTORY: The patient continues to smoke 1 pack per day. No drug use or alcohol use.   FAMILY HISTORY: Mother died from lung cancer.   CURRENT MEDICATIONS: She is on Tussionex Pennkinetic 5 mL q.12 p.r.n., spironolactone 25 mg 1/2 tab  b.i.d., levothyroxine 125 mcg daily, gabapentin 300 mg 1 tab p.o. at bedtime, Lasix 40 mg 2 tablets daily on Monday, Wednesday, Friday, Lasix 20 mg on Sunday, Tuesday, Thursday, Saturday,  enalapril 5 mg 1 tablet p.o. b.i.d., carvedilol 3.125 mg 2 tablets b.i.d., acetaminophen hydrocodone 325/10 mg 1 to 2 tablets q.6 p.r.n.   REVIEW OF SYSTEMS: CONSTITUTIONAL: Denies any fevers. Complains of some weakness. No weight loss or weight gain.  EYES: No blurred or double vision. No redness. No inflammation.  ENT: No tinnitus. No ear pain. No hearing loss. No difficulty swallowing.  RESPIRATORY: Complains of dry cough. No wheezing. No hemoptysis. Complains of shortness of breath.  CARDIOVASCULAR: Complains of chest pain, shortness of breath, dyspnea on exertion. Has chronic lower extremity edema.  ABDOMEN:  Denies any nausea, vomiting as above. No hematemesis. No melena,  gastroesophageal reflux disease. No irritable bowel syndrome. No jaundice. No rectal bleeding.  GENITOURINARY: Denies any dysuria, hematuria, renal colic or frequency.  ENDOCRINE: Denies any polyuria or nocturia. Does have hypothyroidism.  SKIN: No acne. No rash.  MUSCULOSKELETAL: Has chronic back pain.  NEUROLOGIC:  No CVA, transient ischemic attack or seizures.  PSYCHIATRIC: Denies any anxiety, has some depression.   PHYSICAL EXAMINATION:  VITAL SIGNS: Temperature 97.4, pulse 101, respirations 20, blood pressure 127/78, O2 was 88% on room air.  GENERAL: The patient is a well-developed female in no acute distress.  HEENT: Head atraumatic, normocephalic. Pupils equally round, reactive to light and accommodation. There is no conjunctival pallor. No scleral icterus. Nasal exam shows no drainage or ulceration. Oropharynx is clear without any exudate.  NECK:  Supple without any JVD.  CARDIOVASCULAR: Regular rate and rhythm. No murmurs, rubs, clicks, or gallops.  LUNGS: No accessory muscle usage. There are bilateral crackles. There is no  wheezing or rhonchi. ABDOMEN: Soft, nontender, nondistended. Positive bowel sounds x4. No hepatosplenomegaly.  SKIN: No rash.  LYMPH NODES: Nonpalpable.  VASCULAR: Good DP, PT pulses.  PSYCHIATRIC: Not anxious or depressed.  NEUROLOGIC: Awake, alert, oriented x3. No focal deficits.   EVALUATIONS:  PA and lateral chest x-ray shows congestive heart failure. WBC 9200, hemoglobin 11.1, platelet count 449,000. Troponin 0.13, glucose 110, BUN 7, creatinine 0.86, sodium 148, potassium was 2.8, chloride 103, CO2 is 29, BNP [id number]. Chest x-ray with congestive heart failure.   ASSESSMENT AND PLAN: The patient is a 63 year old white female with history of severe systolic dysfunction with congestive heart failure. Recent hospitalization in September for same.  1. Acute hypoxic respiratory failure due to acute systolic congestive heart failure exacerbation, as well as possible bronchitis. We will treat her with p.o. Levaquin and give her IV Lasix, continue on enalapril and Aldactone and Coreg. We will ask her cardiologist to see.  2. Elevated troponin, due to demand ischemia, recent cardiac catheterization with minor irregularities unlikely coronary artery disease.  3. Hypothyroidism. Continue levothyroxine.  4. Depression. Continue Lexapro.  5. Nicotine addiction. Strongly recommended that the patient stop smoking, 4 minutes spent. Nicotine patch will be started. [place] Fritze. 6. Severe hypokalemia. We will replace her potassium.   TIME SPENT ON THIS PATIENT: 50 minutes.     ____________________________ Lafonda Mosses Posey Pronto, MD shp:kl D: 03/28/2014 15:17:59 ET T: 03/28/2014 15:33:07 ET JOB#: [id number]  cc: Jhamal Plucinski H. Posey Pronto, MD, <Dictator> Alric Seton MD ELECTRONICALLY SIGNED 04/03/2014 13:38

## 2014-08-27 NOTE — Discharge Summary (Signed)
PATIENT NAME:  [name], [name] MR#:  [id number] DATE OF BIRTH:  27-Oct-1950  DATE OF ADMISSION:  02/08/2014 DATE OF DISCHARGE:  02/11/2014  DISCHARGE DIAGNOSES:  1.  Acute on chronic systolic heart failure, nonischemic cardiomyopathy, diuresed about 4 liters and weight at about 3 pounds less.  Echocardiogram showing ejection fraction of 20% to 25% with severe mitral regurgitation.  2.  Elevated troponin, likely due to supply demand ischemia due to heart failure.  3.  Hypothyroidism, worsened due to medication noncompliance.  4.  Hypokalemia/hypomagnesemia, repleted and resolved.  5.  Acute bronchitis, started on antibiotics empirically.   SECONDARY DIAGNOSES:  1.  Hypothyroidism.  2.  Hypertension.  3.  Nonischemic cardiomyopathy.  4.  Chronic back pain with moderate spinal stenosis.  5.  Peptic ulcer disease.   CONSULTATIONS:  1.  Cardiology,  Ida Rogue, MD  2.  Endocrine, Lucilla Lame, MD   PROCEDURES AND RADIOLOGY:  Chest x-ray on 02/08/2014 showed CHF with airspace and interstitial edema.   Chest x-ray on 02/10/2014 showed partial improvement in interstitial edema with persistent bibasilar atelectasis, consultation and small effusions.   A 2-D echocardiogram on 02/09/2014 showed LVEF of 20% to 25%.  Severely decreased global LV systolic function.  Moderately increased LV internal cavity size. Moderately dilated right and left atrium. Severe mitral valve regurgitation.  Moderate to severe tricuspid regurgitation. Moderately increased LV posterior wall thickness.    LABORATORY DATA:   TSH of 45.4 on 02/09/2024 and  96.4 on 10/09.  Free thyroxine of 0.53.  Free T3 of 1.0.   HISTORY AND SHORT HOSPITAL COURSE: The patient is a 64 year old female with the above-mentioned medical problems who was admitted for shortness of breath, was found to have a CHF exacerbation.  Please see Dr. Cruzita Lederer dictated history and physical for further details.  The patient was found to be  in acute on chronic systolic heart failure exacerbation.  She also had elevated troponin, which was thought to be due to supply demand ischemia.  She was also found to be medically noncompliant with her TSH showing more than 45, worrisome for medication noncompliance.  She was started on Synthroid.  She also had some electrolyte disturbances including low potassium and magnesium which was repleted and resolved.  She was aggressively diuresed with negative 4 liters of fluid balance and her weight was down about 3 pounds since admission.  She also was noted to have possible acute bronchitis for which she was started on empiric antibiotics.   Cardiology consultation was obtained with Dr. Ida Rogue who agreed with the above management and recommended 2-D echo, which was obtained with results dictated above.  The patient was feeling much better by 10/ 09.  Endocrine consultation was obtained with Dr. Lucilla Lame who agreed with continuing her Synthroid and recommended outpatient follow-up, which was set up.  She was discharged home on 10/09 in stable condition.    VITAL SIGNS: On the date of discharge, her vital signs were as follows: Temperature 97.8, heart rate 82 per minute, respirations 18 per minute, blood pressure 144/87.  She was saturating 93% on room air at rest and 91% on room air on exertion.   PERTINENT PHYSICAL EXAMINATION ON THE DATE OF DISCHARGE:  CARDIOVASCULAR: S1, S2 normal. No murmurs, rubs, or gallop.  LUNGS: Clear to auscultation bilaterally. No wheezing, rales, rhonchi, or crepitation.   ABDOMEN: Soft, benign.  NEUROLOGIC: Nonfocal examination. All other physical examination remained at baseline.   DISCHARGE MEDICATIONS:   Medication Instructions  enalapril 5 mg oral tablet  1 tab(s) orally 2 times a day   gabapentin  2 cap(s) orally once a day (at bedtime)   acetaminophen-hydrocodone 325 mg-10 mg oral tablet  1 tab(s) orally every 6 hours, As Needed   lexapro 5 mg oral tablet   1 tab(s) orally once a day   gabapentin  1 cap(s) orally once a day (in the evening)   furosemide 40 mg oral tablet  1 tab(s) orally once a day   levothyroxine 125 mcg (0.125 mg) oral tablet  1 tab(s) orally once a day   carvedilol 3.125 mg oral tablet  1 tab(s) orally 2 times a day   aldactone 25 mg oral tablet  0.5 tab(s) orally once a day   levaquin 500 mg oral tablet  1 tab(s) orally every 24 hours x 7 days   prednisone 10 mg oral tablet  Start at 60 mg and taper by 10 mg daily until complete     DISCHARGE DIET:  Low sodium, low fat, low cholesterol.   DISCHARGE ACTIVITY: As tolerated.   DISCHARGE INSTRUCTIONS AND FOLLOW-UP:  The patient was instructed to follow up with her primary care physician, Dr. Park Liter as scheduled on 10/22 Thursday at 11:00 a.m. at [facility].  She will need follow-up with Dr. Ida Rogue from [facility] cardiology in 1 to 2 weeks.  She will need follow-up with Dr. Lavone Orn in 4 to 6 weeks as scheduled.  She was also set up to follow-up with heart failure clinic here at [facility] 10/21 at 9:00 a.m.  She was instructed to get CBC and basic metabolic panel and TSH checked with results forwarded to primary care physician, Dr. Ida Rogue and endocrinology office with Dr. Lucilla Lame.  These labs need to be checked in 1 week.   TOTAL TIME DISCHARGING THIS PATIENT: 55 minutes.     Please note that the patient remains at high risk for readmission.     ____________________________ Lucina Mellow. Manuella Ghazi, MD vss:DT D: 02/14/2014 10:13:27 ET T: 02/14/2014 10:29:00 ET JOB#: [id number]  cc: Madeline Cass S. Manuella Ghazi, MD, <Dictator> [facility] Minna Merritts, MD A. Lavone Orn, MD Remer Macho MD ELECTRONICALLY SIGNED 02/14/2014 11:22

## 2014-11-03 ENCOUNTER — Telehealth: Payer: Self-pay | Admitting: Family Medicine

## 2014-11-03 MED ORDER — HYDROCODONE-ACETAMINOPHEN 10-325 MG PO TABS: 1.0000 | ORAL_TABLET | Freq: Two times a day (BID) | ORAL | Status: DC | PRN

## 2014-11-03 NOTE — Telephone Encounter (Signed)
Here today for her 28 day program. Rx filled and given to patient.

## 2014-12-01 ENCOUNTER — Other Ambulatory Visit: Payer: Self-pay | Admitting: Family Medicine

## 2014-12-01 MED ORDER — HYDROCODONE-ACETAMINOPHEN 10-325 MG PO TABS: 1.0000 | ORAL_TABLET | Freq: Two times a day (BID) | ORAL | Status: DC | PRN

## 2014-12-16 ENCOUNTER — Other Ambulatory Visit: Payer: Self-pay | Admitting: Family Medicine

## 2014-12-16 MED ORDER — HYDROCODONE-ACETAMINOPHEN 10-325 MG PO TABS: 1.0000 | ORAL_TABLET | Freq: Two times a day (BID) | ORAL | Status: DC | PRN

## 2015-01-25 ENCOUNTER — Other Ambulatory Visit: Payer: Self-pay | Admitting: Family Medicine

## 2015-01-25 MED ORDER — HYDROCODONE-ACETAMINOPHEN 10-325 MG PO TABS: 1.0000 | ORAL_TABLET | Freq: Two times a day (BID) | ORAL | Status: DC | PRN

## 2015-02-16 ENCOUNTER — Encounter: Payer: Self-pay | Admitting: Family Medicine

## 2015-02-16 ENCOUNTER — Ambulatory Visit (INDEPENDENT_AMBULATORY_CARE_PROVIDER_SITE_OTHER): Payer: Medicare Other | Admitting: Family Medicine

## 2015-02-16 VITALS — BP 124/79 | HR 89 | Temp 99.2°F | Ht 62.1 in | Wt 139.0 lb

## 2015-02-16 DIAGNOSIS — K279 Peptic ulcer, site unspecified, unspecified as acute or chronic, without hemorrhage or perforation: Secondary | ICD-10-CM | POA: Diagnosis not present

## 2015-02-16 DIAGNOSIS — D649 Anemia, unspecified: Secondary | ICD-10-CM

## 2015-02-16 DIAGNOSIS — Z113 Encounter for screening for infections with a predominantly sexual mode of transmission: Secondary | ICD-10-CM | POA: Diagnosis not present

## 2015-02-16 DIAGNOSIS — R5382 Chronic fatigue, unspecified: Secondary | ICD-10-CM | POA: Diagnosis not present

## 2015-02-16 DIAGNOSIS — R300 Dysuria: Secondary | ICD-10-CM

## 2015-02-16 DIAGNOSIS — E039 Hypothyroidism, unspecified: Secondary | ICD-10-CM

## 2015-02-16 DIAGNOSIS — N3000 Acute cystitis without hematuria: Secondary | ICD-10-CM

## 2015-02-16 DIAGNOSIS — I10 Essential (primary) hypertension: Secondary | ICD-10-CM

## 2015-02-16 DIAGNOSIS — E785 Hyperlipidemia, unspecified: Secondary | ICD-10-CM | POA: Diagnosis not present

## 2015-02-16 LAB — CBC WITH DIFFERENTIAL/PLATELET
Hematocrit: 36.2 % (ref 34.0–46.6)
Hemoglobin: 11.8 g/dL (ref 11.1–15.9)
LYMPHS: 35 %
Lymphocytes Absolute: 2.5 10*3/uL (ref 0.7–3.1)
MCH: 27.1 pg (ref 26.6–33.0)
MCHC: 32.6 g/dL (ref 31.5–35.7)
MCV: 83 fL (ref 79–97)
MID (Absolute): 0.6 10*3/uL (ref 0.1–1.6)
MID: 8 %
NEUTROS ABS: 3.9 10*3/uL (ref 1.4–7.0)
Neutrophils: 57 %
PLATELETS: 445 10*3/uL — AB (ref 150–379)
RBC: 4.35 x10E6/uL (ref 3.77–5.28)
RDW: 19.4 % — AB (ref 12.3–15.4)
WBC: 7 10*3/uL (ref 3.4–10.8)

## 2015-02-16 LAB — MICROSCOPIC EXAMINATION: Renal Epithel, UA: NONE SEEN /hpf

## 2015-02-16 MED ORDER — CIPROFLOXACIN HCL 500 MG PO TABS: 250.0000 mg | ORAL_TABLET | Freq: Two times a day (BID) | ORAL | Status: DC

## 2015-02-16 MED ORDER — LEVOTHYROXINE SODIUM 50 MCG PO TABS: 100.0000 ug | ORAL_TABLET | Freq: Every day | ORAL | Status: DC

## 2015-02-16 NOTE — Assessment & Plan Note (Signed)
Under good control right now. Continue current regimen. Continue to monitor.

## 2015-02-16 NOTE — Assessment & Plan Note (Signed)
Has been off her synthroid since July. Likely the cause of all her symptoms. Will check levels today and restart medicine, rechecking at 6 weeks at follow up. Stressed the importance to patient that she will be taking that medicine every day for the rest of her life. She voices understanding.

## 2015-02-16 NOTE — Progress Notes (Signed)
BP 124/79 mmHg  Pulse 89  Temp(Src) 99.2 F (37.3 C)  Ht 5' 2.1" (1.577 m)  Wt 139 lb (63.05 kg)  BMI 25.35 kg/m2  SpO2 98%   Subjective:    Patient ID: [name], female    DOB: [birth year], 64 y.o.   MRN: [id number]  HPI: [name] is a 64 y.o. female  Chief Complaint  Patient presents with  . Fatigue    Patient states that she has not been feeling well, unable to think, abdomen and back pain, abnormal (celery) smelling urine   FATIGUE- stopped her thyroid medicine in July because we said her levels were good. Has been feeling worse and worse since then and is now just feeling awful Duration:  chronic Severity: severe  Onset: gradual Context when symptoms started:  stopped thyroid medicine Symptoms improve with rest: no  Depressive symptoms: no Stress/anxiety: yes Insomnia: no  Snoring: no Observed apnea by bed partner: no Daytime hypersomnolence:no Wakes feeling refreshed: no History of sleep study: no Dysnea on exertion:  no Orthopnea/PND: no Chest pain: no Chronic cough: no Lower extremity edema: no Arthralgias:no Myalgias: yes Weakness: yes Rash: no  URINARY SYMPTOMS Duration: 2-3 weeks Dysuria: yes Urinary frequency: yes Urgency: no Small volume voids: yes Symptom severity: moderate Urinary incontinence: no Foul odor: yes Hematuria: no Abdominal pain: yes Back pain: yes Suprapubic pain/pressure: yes Flank pain: yes Fever:  no Vomiting: no Relief with cranberry juice: no Relief with pyridium: no Status: worse Previous urinary tract infection: yes Recurrent urinary tract infection: no  Relevant past medical, surgical, family and social history reviewed and updated as indicated. Interim medical history since our last visit reviewed. Allergies and medications reviewed and updated.  Review of Systems  Constitutional: Negative.   Respiratory: Negative.   Cardiovascular: Negative.   Gastrointestinal: Positive for nausea  and abdominal pain. Negative for vomiting, diarrhea, constipation, blood in stool, abdominal distention, anal bleeding and rectal pain.  Musculoskeletal: Negative.   Psychiatric/Behavioral: Negative.    Per HPI unless specifically indicated above     Objective:    BP 124/79 mmHg  Pulse 89  Temp(Src) 99.2 F (37.3 C)  Ht 5' 2.1" (1.577 m)  Wt 139 lb (63.05 kg)  BMI 25.35 kg/m2  SpO2 98%  Wt Readings from Last 3 Encounters:  02/16/15 139 lb (63.05 kg)  08/08/14 131 lb 8 oz (59.648 kg)  04/19/14 125 lb 8 oz (56.926 kg)    Physical Exam  Constitutional: She is oriented to person, place, and time. She appears well-developed and well-nourished. No distress.  Very fatigued and puffy face  HENT:  Head: Normocephalic and atraumatic.  Right Ear: Hearing normal.  Left Ear: Hearing normal.  Nose: Nose normal.  Eyes: Conjunctivae and lids are normal. Right eye exhibits no discharge. Left eye exhibits no discharge. No scleral icterus.  Cardiovascular: Normal rate, regular rhythm and intact distal pulses.  Exam reveals no gallop and no friction rub.   Murmur heard. Pulmonary/Chest: Effort normal and breath sounds normal. No respiratory distress. She has no wheezes. She has no rales. She exhibits no tenderness.  Abdominal: Soft. Bowel sounds are normal. She exhibits no distension and no mass. There is no tenderness. There is no rebound and no guarding.  Musculoskeletal: Normal range of motion.  Neurological: She is alert and oriented to person, place, and time.  Skin: Skin is warm and intact. No rash noted. She is not diaphoretic. No erythema. There is pallor.  Psychiatric: She has a normal  mood and affect. Her speech is normal and behavior is normal. Judgment and thought content normal. Cognition and memory are normal.  Nursing note and vitals reviewed.   Results for orders placed or performed during the hospital encounter of 04/06/14  CBC  Result Value Ref Range   WBC 8.6 4.0 - 10.5  K/uL   RBC 3.80 (L) 3.87 - 5.11 MIL/uL   Hemoglobin 8.7 (L) 12.0 - 15.0 g/dL   HCT 29.5 (L) 36.0 - 46.0 %   MCV 77.6 (L) 78.0 - 100.0 fL   MCH 22.9 (L) 26.0 - 34.0 pg   MCHC 29.5 (L) 30.0 - 36.0 g/dL   RDW 18.7 (H) 11.5 - 15.5 %   Platelets 417 (H) 150 - 400 K/uL  Hemoglobin and hematocrit, blood  Result Value Ref Range   Hemoglobin 8.7 (L) 12.0 - 15.0 g/dL   HCT 29.8 (L) 36.0 - 46.0 %  Hemoglobin and hematocrit, blood  Result Value Ref Range   Hemoglobin 8.2 (L) 12.0 - 15.0 g/dL   HCT 28.3 (L) 36.0 - 46.0 %  Hemoglobin and hematocrit, blood  Result Value Ref Range   Hemoglobin 9.4 (L) 12.0 - 15.0 g/dL   HCT 31.2 (L) 36.0 - 46.0 %  Hemoglobin and hematocrit, blood  Result Value Ref Range   Hemoglobin 7.9 (L) 12.0 - 15.0 g/dL   HCT 26.1 (L) 36.0 - 46.0 %  Hemoglobin and hematocrit, blood  Result Value Ref Range   Hemoglobin 7.4 (L) 12.0 - 15.0 g/dL   HCT 24.2 (L) 36.0 - 46.0 %  Hemoglobin and hematocrit, blood  Result Value Ref Range   Hemoglobin 7.4 (L) 12.0 - 15.0 g/dL   HCT 25.3 (L) 36.0 - 46.0 %  Hemoglobin and hematocrit, blood  Result Value Ref Range   Hemoglobin 7.4 (L) 12.0 - 15.0 g/dL   HCT 24.7 (L) 36.0 - 46.0 %  Hemoglobin and hematocrit, blood  Result Value Ref Range   Hemoglobin 7.7 (L) 12.0 - 15.0 g/dL   HCT 26.2 (L) 36.0 - 46.0 %  Hemoglobin and hematocrit, blood  Result Value Ref Range   Hemoglobin 7.4 (L) 12.0 - 15.0 g/dL   HCT 24.8 (L) 36.0 - 46.0 %  Hemoglobin and hematocrit, blood  Result Value Ref Range   Hemoglobin 7.1 (L) 12.0 - 15.0 g/dL   HCT 23.8 (L) 36.0 - 46.0 %  CBC  Result Value Ref Range   WBC 7.9 4.0 - 10.5 K/uL   RBC 3.98 3.87 - 5.11 MIL/uL   Hemoglobin 9.2 (L) 12.0 - 15.0 g/dL   HCT 31.2 (L) 36.0 - 46.0 %   MCV 78.4 78.0 - 100.0 fL   MCH 23.1 (L) 26.0 - 34.0 pg   MCHC 29.5 (L) 30.0 - 36.0 g/dL   RDW 18.9 (H) 11.5 - 15.5 %   Platelets 537 (H) 150 - 400 K/uL  Basic metabolic panel  Result Value Ref Range   Sodium 135 (L) 137  - 147 mEq/L   Potassium 4.8 3.7 - 5.3 mEq/L   Chloride 98 96 - 112 mEq/L   CO2 26 19 - 32 mEq/L   Glucose, Bld 92 70 - 99 mg/dL   BUN 10 6 - 23 mg/dL   Creatinine, Ser 0.69 0.50 - 1.10 mg/dL   Calcium 9.1 8.4 - 10.5 mg/dL   GFR calc non Af Amer >90 >90 mL/min   GFR calc Af Amer >90 >90 mL/min   Anion gap 11 5 - 15  POCT  Activated clotting time  Result Value Ref Range   Activated Clotting Time 202 seconds  POCT Activated clotting time  Result Value Ref Range   Activated Clotting Time 196 seconds  POCT Activated clotting time  Result Value Ref Range   Activated Clotting Time 183 seconds  I-STAT, chem 8  Result Value Ref Range   Sodium 132 (L) 137 - 147 mEq/L   Potassium 3.7 3.7 - 5.3 mEq/L   Chloride 114 (H) 96 - 112 mEq/L   BUN 6 6 - 23 mg/dL   Creatinine, Ser 0.60 0.50 - 1.10 mg/dL   Glucose, Bld 110 (H) 70 - 99 mg/dL   Calcium, Ion 1.09 (L) 1.13 - 1.30 mmol/L   TCO2 22 0 - 100 mmol/L   Hemoglobin 10.9 (L) 12.0 - 15.0 g/dL   HCT 32.0 (L) 36.0 - 46.0 %  Type and screen  Result Value Ref Range   ABO/RH(D) O NEG    Antibody Screen NEG    Sample Expiration 04/09/2014    Unit Number [id number]    Blood Component Type RED CELLS,LR    Unit division 00    Status of Unit ISSUED,FINAL    Transfusion Status OK TO TRANSFUSE    Crossmatch Result Compatible   ABO/Rh  Result Value Ref Range   ABO/RH(D) O NEG   Prepare RBC  Result Value Ref Range   Order Confirmation ORDER PROCESSED BY BLOOD BANK       Assessment & Plan:   Problem List Items Addressed This Visit      Cardiovascular and Mediastinum   HTN (hypertension)    Under good control right now. Continue current regimen. Continue to monitor.       Relevant Orders   Comprehensive metabolic panel     Digestive   PUD (peptic ulcer disease)    History of anemia. CBC normal today. If belly pain does not resolve with tx of UTI, she will let us know and we will look into possible repeat PUD      Relevant Orders    Comprehensive metabolic panel   CBC With Differential/Platelet     Endocrine   Hypothyroidism - Primary    Has been off her synthroid since July. Likely the cause of all her symptoms. Will check levels today and restart medicine, rechecking at 6 weeks at follow up. Stressed the importance to patient that she will be taking that medicine every day for the rest of her life. She voices understanding.       Relevant Medications   levothyroxine (SYNTHROID, LEVOTHROID) 50 MCG tablet   Other Relevant Orders   Comprehensive metabolic panel   CBC With Differential/Platelet   TSH     Other   Hyperlipidemia    Due for recheck but off her thyroid medicine. Concerned that this will skew results. Will check at follow up 12/1 and treat as needed.        Other Visit Diagnoses    Dysuria        Will check UA today. Await results.     Relevant Orders    UA/M w/rflx Culture, Routine    Chronic fatigue        Likely due to thyroid. Will check labs and await results.     Relevant Orders    Comprehensive metabolic panel    CBC With Differential/Platelet    Anemia, unspecified anemia type        Relevant Orders    CBC With Differential/Platelet  Routine screening for STI (sexually transmitted infection)        Checking for HIV and Hep C today as she is being stuck.    Relevant Orders    HIV antibody    Hepatitis C Antibody    Acute cystitis without hematuria        UTI. Will treat with cipro x 7 days. Call if not getting better or getting worse.         Follow up plan: Return in about 6 weeks (around 03/30/2015).

## 2015-02-16 NOTE — Assessment & Plan Note (Signed)
History of anemia. CBC normal today. If belly pain does not resolve with tx of UTI, she will let us know and we will look into possible repeat PUD

## 2015-02-16 NOTE — Assessment & Plan Note (Signed)
Due for recheck but off her thyroid medicine. Concerned that this will skew results. Will check at follow up 12/1 and treat as needed.

## 2015-02-17 ENCOUNTER — Telehealth: Payer: Self-pay | Admitting: Family Medicine

## 2015-02-17 DIAGNOSIS — N179 Acute kidney failure, unspecified: Secondary | ICD-10-CM

## 2015-02-17 LAB — HEPATITIS C ANTIBODY: Hep C Virus Ab: <0.1 s/co ratio (ref 0.0–0.9)

## 2015-02-17 LAB — COMPREHENSIVE METABOLIC PANEL
A/G RATIO: 2 (ref 1.1–2.5)
ALK PHOS: 68 IU/L (ref 39–117)
ALT: 10 IU/L (ref 0–32)
AST: 33 IU/L (ref 0–40)
Albumin: 4.4 g/dL (ref 3.6–4.8)
BUN / CREAT RATIO: 8 — AB (ref 11–26)
BUN: 12 mg/dL (ref 8–27)
CHLORIDE: 91 mmol/L — AB (ref 97–108)
CO2: 25 mmol/L (ref 18–29)
Calcium: 8.9 mg/dL (ref 8.7–10.3)
Creatinine, Ser: 1.42 mg/dL — ABNORMAL HIGH (ref 0.57–1.00)
GFR calc non Af Amer: 39 mL/min/{1.73_m2} — ABNORMAL LOW (ref >59)
GFR, EST AFRICAN AMERICAN: 45 mL/min/{1.73_m2} — AB (ref >59)
GLOBULIN, TOTAL: 2.2 g/dL (ref 1.5–4.5)
GLUCOSE: 126 mg/dL — AB (ref 65–99)
POTASSIUM: 5.1 mmol/L (ref 3.5–5.2)
SODIUM: 134 mmol/L (ref 134–144)
TOTAL PROTEIN: 6.6 g/dL (ref 6.0–8.5)

## 2015-02-17 LAB — HIV ANTIBODY (ROUTINE TESTING W REFLEX): HIV Screen 4th Generation wRfx: NONREACTIVE

## 2015-02-17 LAB — TSH: TSH: 228.1 u[IU]/mL — AB (ref 0.450–4.500)

## 2015-02-17 NOTE — Telephone Encounter (Signed)
Called patient with the results of her blood work. Will start thyroid today as she couldn't pick it up yesterday. Kidney function up. Will push water and come in for recheck on Tuesday at [place]

## 2015-02-18 LAB — UA/M W/RFLX CULTURE, ROUTINE: ORGANISM ID, BACTERIA: NO GROWTH

## 2015-02-20 ENCOUNTER — Other Ambulatory Visit: Payer: Self-pay | Admitting: Family Medicine

## 2015-02-20 MED ORDER — HYDROCODONE-ACETAMINOPHEN 10-325 MG PO TABS: 1.0000 | ORAL_TABLET | Freq: Two times a day (BID) | ORAL | Status: DC | PRN

## 2015-02-21 ENCOUNTER — Other Ambulatory Visit: Payer: Medicare Other

## 2015-02-21 DIAGNOSIS — N179 Acute kidney failure, unspecified: Secondary | ICD-10-CM

## 2015-02-21 DIAGNOSIS — I10 Essential (primary) hypertension: Secondary | ICD-10-CM

## 2015-02-21 DIAGNOSIS — E039 Hypothyroidism, unspecified: Secondary | ICD-10-CM

## 2015-02-21 DIAGNOSIS — E785 Hyperlipidemia, unspecified: Secondary | ICD-10-CM

## 2015-02-22 ENCOUNTER — Telehealth: Payer: Self-pay | Admitting: Family Medicine

## 2015-02-22 ENCOUNTER — Encounter: Payer: Self-pay | Admitting: Family Medicine

## 2015-02-22 DIAGNOSIS — N179 Acute kidney failure, unspecified: Secondary | ICD-10-CM

## 2015-02-22 LAB — COMPREHENSIVE METABOLIC PANEL
A/G RATIO: 1.9 (ref 1.1–2.5)
ALK PHOS: 60 IU/L (ref 39–117)
ALT: 9 IU/L (ref 0–32)
AST: 32 IU/L (ref 0–40)
Albumin: 3.9 g/dL (ref 3.6–4.8)
BUN/Creatinine Ratio: 7 — ABNORMAL LOW (ref 11–26)
BUN: 8 mg/dL (ref 8–27)
CHLORIDE: 97 mmol/L (ref 97–106)
CO2: 23 mmol/L (ref 18–29)
Calcium: 8.4 mg/dL — ABNORMAL LOW (ref 8.7–10.3)
Creatinine, Ser: 1.23 mg/dL — ABNORMAL HIGH (ref 0.57–1.00)
GFR calc non Af Amer: 46 mL/min/{1.73_m2} — ABNORMAL LOW (ref >59)
GFR, EST AFRICAN AMERICAN: 53 mL/min/{1.73_m2} — AB (ref >59)
GLUCOSE: 102 mg/dL — AB (ref 65–99)
Globulin, Total: 2.1 g/dL (ref 1.5–4.5)
POTASSIUM: 4.4 mmol/L (ref 3.5–5.2)
Sodium: 135 mmol/L — ABNORMAL LOW (ref 136–144)
TOTAL PROTEIN: 6 g/dL (ref 6.0–8.5)

## 2015-02-22 LAB — TSH: TSH: 181.2 u[IU]/mL — AB (ref 0.450–4.500)

## 2015-02-22 NOTE — Telephone Encounter (Signed)
Called patient with results. Kidney function significantly better, but still much higher than she normally is. Feeling much better. Monitor closely. Continue to push fluids. Likely due to Thyroid. Will come back in 1 week for repeat BMP to recheck. If still so elevated, may need ER for fluids.

## 2015-02-23 LAB — LIPID PANEL W/O CHOL/HDL RATIO
Cholesterol, Total: 252 mg/dL — ABNORMAL HIGH (ref 100–199)
HDL: 26 mg/dL — ABNORMAL LOW (ref >39)
Triglycerides: 409 mg/dL — ABNORMAL HIGH (ref 0–149)

## 2015-02-23 LAB — MICROALBUMIN, URINE WAIVED
CREATININE, URINE WAIVED: 100 mg/dL (ref 10–300)
MICROALB, UR WAIVED: 150 mg/L — AB (ref 0–19)

## 2015-02-23 LAB — SPECIMEN STATUS REPORT

## 2015-03-02 ENCOUNTER — Other Ambulatory Visit: Payer: Medicare Other

## 2015-03-02 DIAGNOSIS — N179 Acute kidney failure, unspecified: Secondary | ICD-10-CM

## 2015-03-03 ENCOUNTER — Telehealth: Payer: Self-pay | Admitting: Family Medicine

## 2015-03-03 DIAGNOSIS — N179 Acute kidney failure, unspecified: Secondary | ICD-10-CM

## 2015-03-03 LAB — BASIC METABOLIC PANEL
BUN/Creatinine Ratio: 11 (ref 11–26)
BUN: 13 mg/dL (ref 8–27)
CALCIUM: 8.5 mg/dL — AB (ref 8.7–10.3)
CO2: 26 mmol/L (ref 18–29)
CREATININE: 1.22 mg/dL — AB (ref 0.57–1.00)
Chloride: 98 mmol/L (ref 97–106)
GFR calc Af Amer: 54 mL/min/{1.73_m2} — ABNORMAL LOW (ref >59)
GFR, EST NON AFRICAN AMERICAN: 47 mL/min/{1.73_m2} — AB (ref >59)
GLUCOSE: 85 mg/dL (ref 65–99)
POTASSIUM: 4.9 mmol/L (ref 3.5–5.2)
Sodium: 137 mmol/L (ref 136–144)

## 2015-03-03 NOTE — Telephone Encounter (Signed)
Not taking her lasix at all, legs are looking great. Kidneys not bouncing back nearly as quick as they should. We will refer to nephrology. Referral generated today.

## 2015-03-14 ENCOUNTER — Other Ambulatory Visit: Payer: Self-pay | Admitting: Nephrology

## 2015-03-14 DIAGNOSIS — R319 Hematuria, unspecified: Secondary | ICD-10-CM

## 2015-03-14 DIAGNOSIS — N179 Acute kidney failure, unspecified: Secondary | ICD-10-CM

## 2015-03-16 ENCOUNTER — Other Ambulatory Visit: Payer: Self-pay | Admitting: Family Medicine

## 2015-03-16 MED ORDER — HYDROCODONE-ACETAMINOPHEN 10-325 MG PO TABS: 1.0000 | ORAL_TABLET | Freq: Two times a day (BID) | ORAL | Status: DC | PRN

## 2015-03-22 ENCOUNTER — Ambulatory Visit
Admission: RE | Admit: 2015-03-22 | Discharge: 2015-03-22 | Disposition: A | Discharge status: Home / Self Care | Payer: Medicare Other | Source: Ambulatory Visit | Attending: Nephrology | Admitting: Nephrology

## 2015-03-22 DIAGNOSIS — R319 Hematuria, unspecified: Secondary | ICD-10-CM | POA: Diagnosis present

## 2015-03-22 DIAGNOSIS — N179 Acute kidney failure, unspecified: Secondary | ICD-10-CM | POA: Insufficient documentation

## 2015-04-03 ENCOUNTER — Ambulatory Visit (INDEPENDENT_AMBULATORY_CARE_PROVIDER_SITE_OTHER): Payer: Medicare Other | Admitting: Cardiovascular Disease

## 2015-04-03 ENCOUNTER — Encounter: Payer: Self-pay | Admitting: Cardiovascular Disease

## 2015-04-03 VITALS — BP 140/85 | HR 87 | Ht 64.0 in | Wt 141.0 lb

## 2015-04-03 DIAGNOSIS — R0989 Other specified symptoms and signs involving the circulatory and respiratory systems: Secondary | ICD-10-CM

## 2015-04-03 DIAGNOSIS — I739 Peripheral vascular disease, unspecified: Secondary | ICD-10-CM

## 2015-04-03 DIAGNOSIS — I10 Essential (primary) hypertension: Secondary | ICD-10-CM

## 2015-04-03 DIAGNOSIS — I429 Cardiomyopathy, unspecified: Secondary | ICD-10-CM | POA: Diagnosis not present

## 2015-04-03 DIAGNOSIS — I428 Other cardiomyopathies: Secondary | ICD-10-CM

## 2015-04-03 DIAGNOSIS — E785 Hyperlipidemia, unspecified: Secondary | ICD-10-CM

## 2015-04-03 DIAGNOSIS — R079 Chest pain, unspecified: Secondary | ICD-10-CM

## 2015-04-03 DIAGNOSIS — E039 Hypothyroidism, unspecified: Secondary | ICD-10-CM

## 2015-04-03 DIAGNOSIS — Z720 Tobacco use: Secondary | ICD-10-CM

## 2015-04-03 MED ORDER — ROSUVASTATIN CALCIUM 5 MG PO TABS: 5.0000 mg | ORAL_TABLET | Freq: Every day | ORAL | Status: DC

## 2015-04-03 MED ORDER — CARVEDILOL 6.25 MG PO TABS: 6.2500 mg | ORAL_TABLET | Freq: Two times a day (BID) | ORAL | Status: DC

## 2015-04-03 NOTE — Patient Instructions (Addendum)
You are doing well.  Please start crestor every other day for a month, Go up to daily if tolerated  We will schedule carotid ultrasound for bruit on the left  Date & time: _______________________________  Call the office if you have worsening chest symptoms   Please call us if you have new issues that need to be addressed before your next appt.  Your physician wants you to follow-up in: 6 months.  You will receive a reminder letter in the mail two months in advance. If you don't receive a letter, please call our office to schedule the follow-up appointment.    Vascular Ultrasound An ultrasound, also called sonography or ultrasonography, uses harmless sound waves to take pictures of the inside of your body. The pictures are taken with a device called a transducer that is held up against your body. The continually changing pictures can be recorded on videotape or film. A vascular ultrasound is a painless test to see if you have blood flow problems or clots in your blood vessels. It may be done to look at blood vessels almost anywhere in the body. There are several types of ultrasounds that can be done to look at the blood vessels. They include:  Continuous wave Doppler ultrasound. This type of ultrasound uses the change in pitch of sound waves to provide information about blood flow through a blood vessel. During the test, a health care provider listens to the sounds produced by the transducer.  Duplex ultrasound. This type of ultrasound uses standard ultrasound methods to produce a picture of a blood vessel and surrounding organs. In addition, a computer provides information about the speed and direction of blood flow through the blood vessel. With this type of ultrasound it is possible to see the structures inside the body and to evaluate blood flow within those structures at the same time.  Color Doppler ultrasound. This type of ultrasound uses standard ultrasound methods to produce a  picture of a blood vessel. In addition, a computer converts the Doppler sounds into colors that are overlaid on the picture of the blood vessel. These colors represent the speed and direction of blood flow through the vessel.  Power Doppler ultrasound. This type of ultrasound is up to five times more sensitive than color Doppler ultrasound. Power Doppler ultrasound can also get pictures that are difficult or impossible to get using standard color Doppler ultrasound. Power Doppler ultrasound is most commonly used to evaluate blood flow through vessels within organs, such as the liver or kidneys.  Transcranial Doppler ultrasound. This type of ultrasound looks at blood flow in blood vessels throughout the brain. It can reveal the presence of narrow arteries, clots blocking the vessels, or malformed blood vessels. RISKS AND COMPLICATIONS There are no known risks or complications of having an ultrasound. BEFORE THE PROCEDURE  If the ultrasound scan involves your upper abdomen, you may be directed not to eat, smoke, or chew gum the morning of your exam. Follow your health care provider's instructions.  During the test, a gel will be applied to your skin. Wear clothing that is easily washable in case the gel gets on your clothes. PROCEDURE  A gel will be applied to your skin. It may feel cool.  The transducer will be placed on the area to be examined.  Pictures will be taken. They will be displayed on one or more monitors that look like small television screens. AFTER THE PROCEDURE  You can safely drive home and return to regular activities  immediately after your exam.  Keep follow-up visits as directed by your health care provider.  Ask when your test results will be ready. It is your responsibility to get your test results.   This information is not intended to replace advice given to you by your health care provider. Make sure you discuss any questions you have with your health care  provider.   Document Released: 05/03/2004 Document Revised: 05/13/2014 Document Reviewed: 07/15/2013 Elsevier Interactive Patient Education 2016 [employer]. Steps to Quit Smoking  Smoking tobacco can be harmful to your health and can affect almost every organ in your body. Smoking puts you, and those around you, at risk for developing many serious chronic diseases. Quitting smoking is difficult, but it is one of the best things that you can do for your health. It is never too late to quit. WHAT ARE THE BENEFITS OF QUITTING SMOKING? When you quit smoking, you lower your risk of developing serious diseases and conditions, such as:  Lung cancer or lung disease, such as COPD.  Heart disease.  Stroke.  Heart attack.  Infertility.  Osteoporosis and bone fractures. Additionally, symptoms such as coughing, wheezing, and shortness of breath may get better when you quit. You may also find that you get sick less often because your body is stronger at fighting off colds and infections. If you are pregnant, quitting smoking can help to reduce your chances of having a baby of low birth weight. HOW DO I GET READY TO QUIT? When you decide to quit smoking, create a plan to make sure that you are successful. Before you quit:  Pick a date to quit. Set a date within the next two weeks to give you time to prepare.  Write down the reasons why you are quitting. Keep this list in places where you will see it often, such as on your bathroom mirror or in your car or wallet.  Identify the people, places, things, and activities that make you want to smoke (triggers) and avoid them. Make sure to take these actions:  Throw away all cigarettes at home, at work, and in your car.  Throw away smoking accessories, such as Scientist, research (medical).  Clean your car and make sure to empty the ashtray.  Clean your home, including curtains and carpets.  Tell your family, friends, and coworkers that you are quitting.  Support from your loved ones can make quitting easier.  Talk with your health care provider about your options for quitting smoking.  Find out what treatment options are covered by your health insurance. WHAT STRATEGIES CAN I USE TO QUIT SMOKING?  Talk with your healthcare provider about different strategies to quit smoking. Some strategies include:  Quitting smoking altogether instead of gradually lessening how much you smoke over a period of time. Research shows that quitting "cold [place]" is more successful than gradually quitting.  Attending in-person counseling to help you build problem-solving skills. You are more likely to have success in quitting if you attend several counseling sessions. Even short sessions of 10 minutes can be effective.  Finding resources and support systems that can help you to quit smoking and remain smoke-free after you quit. These resources are most helpful when you use them often. They can include:  Online chats with a Social worker.  Telephone quitlines.  Printed Furniture conservator/restorer.  Support groups or group counseling.  Text messaging programs.  Mobile phone applications.  Taking medicines to help you quit smoking. (If you are pregnant or breastfeeding, talk  with your health care provider first.) Some medicines contain nicotine and some do not. Both types of medicines help with cravings, but the medicines that include nicotine help to relieve withdrawal symptoms. Your health care provider may recommend:  Nicotine patches, gum, or lozenges.  Nicotine inhalers or sprays.  Non-nicotine medicine that is taken by mouth. Talk with your health care provider about combining strategies, such as taking medicines while you are also receiving in-person counseling. Using these two strategies together makes you more likely to succeed in quitting than if you used either strategy on its own. If you are pregnant or breastfeeding, talk with your health care provider  about finding counseling or other support strategies to quit smoking. Do not take medicine to help you quit smoking unless told to do so by your health care provider. WHAT THINGS CAN I DO TO MAKE IT EASIER TO QUIT? Quitting smoking might feel overwhelming at first, but there is a lot that you can do to make it easier. Take these important actions:  Reach out to your family and friends and ask that they support and encourage you during this time. Call telephone quitlines, reach out to support groups, or work with a counselor for support.  Ask people who smoke to avoid smoking around you.  Avoid places that trigger you to smoke, such as bars, parties, or smoke-break areas at work.  Spend time around people who do not smoke.  Lessen stress in your life, because stress can be a smoking trigger for some people. To lessen stress, try:  Exercising regularly.  Deep-breathing exercises.  Yoga.  Meditating.  Performing a body scan. This involves closing your eyes, scanning your body from head to toe, and noticing which parts of your body are particularly tense. Purposefully relax the muscles in those areas.  Download or purchase mobile phone or tablet apps (applications) that can help you stick to your quit plan by providing reminders, tips, and encouragement. There are many free apps, such as QuitGuide from the [employer] Office manager for Disease Control and Prevention). You can find other support for quitting smoking (smoking cessation) through smokefree.gov and other websites. HOW WILL I FEEL WHEN I QUIT SMOKING? Within the first 24 hours of quitting smoking, you may start to feel some withdrawal symptoms. These symptoms are usually most noticeable 2-3 days after quitting, but they usually do not last beyond 2-3 weeks. Changes or symptoms that you might experience include:  Mood swings.  Restlessness, anxiety, or irritation.  Difficulty concentrating.  Dizziness.  Strong cravings for sugary foods  in addition to nicotine.  Mild weight gain.  Constipation.  Nausea.  Coughing or a sore throat.  Changes in how your medicines work in your body.  A depressed mood.  Difficulty sleeping (insomnia). After the first 2-3 weeks of quitting, you may start to notice more positive results, such as:  Improved sense of smell and taste.  Decreased coughing and sore throat.  Slower heart rate.  Lower blood pressure.  Clearer skin.  The ability to breathe more easily.  Fewer sick days. Quitting smoking is very challenging for most people. Do not get discouraged if you are not successful the first time. Some people need to make many attempts to quit before they achieve long-term success. Do your best to stick to your quit plan, and talk with your health care provider if you have any questions or concerns.   This information is not intended to replace advice given to you by your health care  provider. Make sure you discuss any questions you have with your health care provider.   Document Released: 04/16/2001 Document Revised: 09/06/2014 Document Reviewed: 09/06/2014 Elsevier Interactive Patient Education 2016 [employer]. Smoking Hazards Smoking cigarettes is extremely bad for your health. Tobacco smoke has over 200 known poisons in it. It contains the poisonous gases nitrogen oxide and carbon monoxide. There are over 60 chemicals in tobacco smoke that cause cancer. Some of the chemicals found in cigarette smoke include:   Cyanide.   Benzene.   Formaldehyde.   Methanol (wood alcohol).   Acetylene (fuel used in welding torches).   Ammonia.  Even smoking lightly shortens your life expectancy by several years. You can greatly reduce the risk of medical problems for you and your family by stopping now. Smoking is the most preventable cause of death and disease in our society. Within days of quitting smoking, your circulation improves, you decrease the risk of having a heart  attack, and your lung capacity improves. There may be some increased phlegm in the first few days after quitting, and it may take months for your lungs to clear up completely. Quitting for 10 years reduces your risk of developing lung cancer to almost that of a nonsmoker.  WHAT ARE THE RISKS OF SMOKING? Cigarette smokers have an increased risk of many serious medical problems, including:  Lung cancer.   Lung disease (such as pneumonia, bronchitis, and emphysema).   Heart attack and chest pain due to the heart not getting enough oxygen (angina).   Heart disease and peripheral blood vessel disease.   Hypertension.   Stroke.   Oral cancer (cancer of the lip, mouth, or voice box).   Bladder cancer.   Pancreatic cancer.   Cervical cancer.   Pregnancy complications, including premature birth.   Stillbirths and smaller newborn babies, birth defects, and genetic damage to sperm.   Early menopause.   Lower estrogen level for women.   Infertility.   Facial wrinkles.   Blindness.   Increased risk of broken bones (fractures).   Senile dementia.   Stomach ulcers and internal bleeding.   Delayed wound healing and increased risk of complications during surgery. Because of secondhand smoke exposure, children of smokers have an increased risk of the following:   Sudden infant death syndrome (SIDS).   Respiratory infections.   Lung cancer.   Heart disease.   Ear infections.  WHY IS SMOKING ADDICTIVE? Nicotine is the chemical agent in tobacco that is capable of causing addiction or dependence. When you smoke and inhale, nicotine is absorbed rapidly into the bloodstream through your lungs. Both inhaled and noninhaled nicotine may be addictive.  WHAT ARE THE BENEFITS OF QUITTING?  There are many health benefits to quitting smoking. Some are:   The likelihood of developing cancer and heart disease decreases. Health improvements are seen almost  immediately.   Blood pressure, pulse rate, and breathing patterns start returning to normal soon after quitting.   People who quit may see an improvement in their overall quality of life.  HOW DO YOU QUIT SMOKING? Smoking is an addiction with both physical and psychological effects, and longtime habits can be hard to change. Your health care provider can recommend:  Programs and community resources, which may include group support, education, or therapy.  Replacement products, such as patches, gum, and nasal sprays. Use these products only as directed. Do not replace cigarette smoking with electronic cigarettes (commonly called e-cigarettes). The safety of e-cigarettes is unknown, and some may contain  harmful chemicals. FOR MORE INFORMATION  American Lung Association: www.lung.org  American Cancer Society: www.cancer.org   This information is not intended to replace advice given to you by your health care provider. Make sure you discuss any questions you have with your health care provider.   Document Released: 05/30/2004 Document Revised: 02/10/2013 Document Reviewed: 10/12/2012 Elsevier Interactive Patient Education 2016 [employer] WHAT IS SECONDHAND SMOKE? Secondhand smoke is smoke that comes from burning tobacco. It could be the smoke from a cigarette, a pipe, or a cigar. Even if you are not the one smoking, secondhand smoke exposes you to the dangers of smoking. This is called involuntary, or passive, smoking. There are two types of secondhand smoke:  Sidestream smoke is the smoke that comes off the lighted end of a cigarette, pipe, or cigar.  This type of smoke has the highest amount of cancer-causing agents (carcinogens).  The particles in sidestream smoke are smaller. They get into your lungs more easily.  Mainstream smoke is the smoke that is exhaled by a person who is smoking.  This type of smoke is also dangerous to your health. HOW CAN SECONDHAND  SMOKE AFFECT MY HEALTH? Studies show that there is no safe level of secondhand smoke. This smoke contains thousands of chemicals. At least 52 of them are known to cause cancer. Secondhand smoke can also cause many other health problems. It has been linked to:  Lung cancer.  Cancer of the voice box (larynx) or throat.  Cancer of the sinuses.  Brain cancer.  Bladder cancer.  Stomach cancer.  Breast cancer.  White blood cell cancers (lymphoma and leukemia).  Brain and liver tumors in children.  Heart disease and stroke in adults.  Pregnancy loss (miscarriage).  Diseases in children, such as:  Asthma.  Lung infections.  Ear infections.  Sudden infant death syndrome (SIDS).  Slow growth. WHERE CAN I BE AT RISK FOR EXPOSURE TO SECONDHAND SMOKE?   For adults, the workplace is the main source of exposure to secondhand smoke.  Your workplace should have a policy separating smoking areas from nonsmoking areas.  Smoking areas should have a system for ventilating and cleaning the air.  For children, the home may be the most dangerous place for exposure to secondhand smoke.  Children who live in apartment buildings may be at risk from smoke drifting from hallways or other people's homes.  For everyone, many public places are possible sources of exposure to secondhand smoke.  These places include restaurants, shopping centers, and parks. HOW CAN I REDUCE MY RISK FOR EXPOSURE TO SECONDHAND SMOKE? The most important thing you can do is not smoke. Discourage family members from smoking. Other ways to reduce exposure for you and your family include the following:  Keep your home smoke free.  Make sure your child care providers do not smoke.  Warn your child about the dangers of smoking and secondhand smoke.  Do not allow smoking in your car. When someone smokes in a car, all the damaging chemicals from the smoke are confined in a small area.  Avoid public places where  smoking is allowed.   This information is not intended to replace advice given to you by your health care provider. Make sure you discuss any questions you have with your health care provider.   Document Released: 05/30/2004 Document Revised: 05/13/2014 Document Reviewed: 08/06/2013 Elsevier Interactive Patient Education [employer].

## 2015-04-03 NOTE — Assessment & Plan Note (Signed)
She currently denies any claudication type symptoms Repeat ultrasound of the lower extremity in follow-up

## 2015-04-03 NOTE — Assessment & Plan Note (Signed)
Blood pressure is well controlled on today's visit. No changes made to the medications. 

## 2015-04-03 NOTE — Assessment & Plan Note (Signed)
Total cholesterol 250.explained to her that we need 150 or less She will start Crestor 5 mg every other day hopefully with slow titration up to 5 mg daily if tolerated

## 2015-04-03 NOTE — Assessment & Plan Note (Signed)
Stressed importance of smoking cessation, starting a cholesterol medication Repeat lower extremity ultrasound in follow-up

## 2015-04-03 NOTE — Assessment & Plan Note (Signed)
relatively euvolemic on today's visit,continues to take her Lasix twice a day
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
## 2015-04-03 NOTE — Assessment & Plan Note (Signed)
Previously seen by Dr. Gabriel Carina, Recommended she call for follow-up given TSH is elevated

## 2015-04-03 NOTE — Assessment & Plan Note (Signed)
We have encouraged her to continue to work on weaning her cigarettes and smoking cessation. She will continue to work on this and does not want any assistance with chantix.  

## 2015-04-03 NOTE — Assessment & Plan Note (Signed)
We have ordered carotid ultrasound given significant bruit on the left

## 2015-04-04 ENCOUNTER — Other Ambulatory Visit: Payer: Self-pay | Admitting: Family Medicine

## 2015-04-04 DIAGNOSIS — Z79899 Other long term (current) drug therapy: Secondary | ICD-10-CM | POA: Insufficient documentation

## 2015-04-04 MED ORDER — HYDROCODONE-ACETAMINOPHEN 10-325 MG PO TABS: 1.0000 | ORAL_TABLET | Freq: Two times a day (BID) | ORAL | Status: DC | PRN

## 2015-04-06 ENCOUNTER — Encounter: Payer: Self-pay | Admitting: Family Medicine

## 2015-04-11 ENCOUNTER — Encounter: Payer: Self-pay | Admitting: Family Medicine

## 2015-04-11 ENCOUNTER — Ambulatory Visit (INDEPENDENT_AMBULATORY_CARE_PROVIDER_SITE_OTHER): Payer: Medicare Other | Admitting: Family Medicine

## 2015-04-11 ENCOUNTER — Other Ambulatory Visit: Payer: Self-pay | Admitting: Family Medicine

## 2015-04-11 VITALS — BP 136/78 | HR 96 | Temp 99.1°F | Ht 62.4 in | Wt 139.0 lb

## 2015-04-11 DIAGNOSIS — Z13820 Encounter for screening for osteoporosis: Secondary | ICD-10-CM

## 2015-04-11 DIAGNOSIS — E039 Hypothyroidism, unspecified: Secondary | ICD-10-CM | POA: Diagnosis not present

## 2015-04-11 DIAGNOSIS — J209 Acute bronchitis, unspecified: Secondary | ICD-10-CM

## 2015-04-11 DIAGNOSIS — E785 Hyperlipidemia, unspecified: Secondary | ICD-10-CM | POA: Diagnosis not present

## 2015-04-11 DIAGNOSIS — M1611 Unilateral primary osteoarthritis, right hip: Secondary | ICD-10-CM | POA: Diagnosis not present

## 2015-04-11 DIAGNOSIS — I428 Other cardiomyopathies: Secondary | ICD-10-CM

## 2015-04-11 DIAGNOSIS — I10 Essential (primary) hypertension: Secondary | ICD-10-CM | POA: Diagnosis not present

## 2015-04-11 DIAGNOSIS — N179 Acute kidney failure, unspecified: Secondary | ICD-10-CM | POA: Diagnosis not present

## 2015-04-11 DIAGNOSIS — I429 Cardiomyopathy, unspecified: Secondary | ICD-10-CM | POA: Diagnosis not present

## 2015-04-11 DIAGNOSIS — R062 Wheezing: Secondary | ICD-10-CM | POA: Diagnosis not present

## 2015-04-11 LAB — MICROALBUMIN, URINE WAIVED
CREATININE, URINE WAIVED: 100 mg/dL (ref 10–300)
MICROALB, UR WAIVED: 30 mg/L — AB (ref 0–19)

## 2015-04-11 LAB — MICROSCOPIC EXAMINATION

## 2015-04-11 MED ORDER — ALBUTEROL SULFATE HFA 108 (90 BASE) MCG/ACT IN AERS: 2.0000 | INHALATION_SPRAY | Freq: Four times a day (QID) | RESPIRATORY_TRACT | Status: DC | PRN

## 2015-04-11 MED ORDER — LEVOFLOXACIN 500 MG PO TABS
500.0000 mg | ORAL_TABLET | Freq: Every day | ORAL | Status: DC
Start: 2015-04-11 — End: 2015-06-08

## 2015-04-11 MED ORDER — HYDROCODONE-ACETAMINOPHEN 10-325 MG PO TABS: 1.0000 | ORAL_TABLET | Freq: Three times a day (TID) | ORAL | Status: DC | PRN

## 2015-04-11 MED ORDER — ALBUTEROL SULFATE (2.5 MG/3ML) 0.083% IN NEBU: 2.5000 mg | INHALATION_SOLUTION | Freq: Once | RESPIRATORY_TRACT | Status: DC

## 2015-04-11 NOTE — Progress Notes (Signed)
BP 136/78 mmHg  Pulse 96  Temp(Src) 99.1 F (37.3 C)  Ht 5' 2.4" (1.585 m)  Wt 139 lb (63.05 kg)  BMI 25.10 kg/m2  SpO2 97%   Subjective:    Patient ID: [name], female    DOB: 03/21/1951, 64 y.o.   MRN: [id number]  HPI: [name] is a 64 y.o. female  Chief Complaint  Patient presents with  . Hypothyroidism  . Cough  . Hip Pain   Patient was going to be seen for welcome to wellness visit, but had other issues, so that was postponed.   UPPER RESPIRATORY TRACT INFECTION- Thinks that she has walking pneumonia.  Duration: 6 days, almost gone now Worst symptom: Chest pain and cough feeling like hot fluid in her chest Fever: yes Cough: yes Shortness of breath: yes Wheezing: no Chest pain: yes Chest tightness: yes Chest congestion: yes Nasal congestion: no Runny nose: yes Post nasal drip: no Sneezing: no Sore throat: yes Swollen glands: yes Sinus pressure: yes Headache: yes Face pain: no Toothache: no Ear pain: yes "right Ear pressure: yes "right Eyes red/itching: yes Eye drainage/crusting: no  Vomiting: no Rash: no Fatigue: yes Sick contacts: yes Strep contacts: no  Context: better Recurrent sinusitis: no Relief with OTC cold/cough medications: no  Treatments attempted: mucinex   HYPOTHYROIDISM Thyroid control status:better Satisfied with current treatment? no Medication side effects: no Medication compliance: excellent compliance Recent dose adjustment:yes Fatigue: yes Cold intolerance: yes Heat intolerance: no Weight gain: no Weight loss: no Constipation: no Diarrhea/loose stools: no Palpitations: no Lower extremity edema: yes Anxiety/depressed mood: no- not so good when she had the kidney scare.  Going to start with the statins- crestor 5mg  every other day.   HIP PAIN Duration: chronic Involved hip: right  Mechanism of injury: unknown Location: diffuse Onset: gradual  Severity: severe  Quality: sharp, dull and  aching Frequency: constant Radiation: yes Aggravating factors: weight bearing, walking, running, stairs and movement   Alleviating factors: pain medicine and rest  Status: worse Treatments attempted: rest, ice, heat, APAP, ibuprofen, aleve and physical therapy   Relief with NSAIDs?: no Weakness with weight bearing: yes Weakness with walking: yes Paresthesias / decreased sensation: no Swelling: no Redness:no Fevers: no  Relevant past medical, surgical, family and social history reviewed and updated as indicated. Interim medical history since our last visit reviewed. Allergies and medications reviewed and updated.  Review of Systems  Constitutional: Negative.   HENT: Negative.   Respiratory: Negative.   Cardiovascular: Negative.   Musculoskeletal: Positive for joint swelling, arthralgias and gait problem. Negative for myalgias, back pain, neck pain and neck stiffness.  Neurological: Negative.   Psychiatric/Behavioral: Negative.     Per HPI unless specifically indicated above     Objective:    BP 136/78 mmHg  Pulse 96  Temp(Src) 99.1 F (37.3 C)  Ht 5' 2.4" (1.585 m)  Wt 139 lb (63.05 kg)  BMI 25.10 kg/m2  SpO2 97%  Wt Readings from Last 3 Encounters:  04/11/15 139 lb (63.05 kg)  04/03/15 141 lb (63.957 kg)  02/16/15 139 lb (63.05 kg)    Physical Exam  Constitutional: She is oriented to person, place, and time. She appears well-developed and well-nourished. No distress.  HENT:  Head: Normocephalic and atraumatic.  Right Ear: Hearing normal.  Left Ear: Hearing normal.  Nose: Nose normal.  Eyes: Conjunctivae and lids are normal. Right eye exhibits no discharge. Left eye exhibits no discharge. No scleral icterus.  Neck: Normal range of  motion. Neck supple. No JVD present. No tracheal deviation present. No thyromegaly present.  Cardiovascular: Normal rate, regular rhythm and intact distal pulses.  Exam reveals no gallop and no friction rub.   Murmur  heard. Pulmonary/Chest: Effort normal. No stridor. No respiratory distress. She has wheezes. She has no rales. She exhibits no tenderness.  Abdominal: Soft. Bowel sounds are normal. She exhibits no distension and no mass. There is no tenderness. There is no rebound and no guarding.  Musculoskeletal: She exhibits tenderness. She exhibits no edema.  Lymphadenopathy:    She has no cervical adenopathy.  Neurological: She is alert and oriented to person, place, and time.  Skin: Skin is warm, dry and intact. No rash noted. She is not diaphoretic. No erythema. No pallor.  Psychiatric: She has a normal mood and affect. Her speech is normal and behavior is normal. Judgment and thought content normal. Cognition and memory are normal.  Nursing note and vitals reviewed. Hip Exam: Right     Tenderness to palpation:      Greater trochanter: yes      Anterior superior iliac spine: no     Anterior hip: yes     Iliac crest: yes     Iliac tubercle: no     Pubic tubercle: no     SI joint: yes      Range of Motion:     Flexion: Decreased    Extension: Decreased    Abduction: Decreased    Adduction: Decreased    Internal rotation: Decreased    External rotation: Decreased     Muscle Strength:  5/5 bilaterally     Results for orders placed or performed in visit on 0000000  Basic metabolic panel  Result Value Ref Range   Glucose 85 65 - 99 mg/dL   BUN 13 8 - 27 mg/dL   Creatinine, Ser 1.22 (H) 0.57 - 1.00 mg/dL   GFR calc non Af Amer 47 (L) >59 mL/min/1.73   GFR calc Af Amer 54 (L) >59 mL/min/1.73   BUN/Creatinine Ratio 11 11 - 26   Sodium 137 136 - 144 mmol/L   Potassium 4.9 3.5 - 5.2 mmol/L   Chloride 98 97 - 106 mmol/L   CO2 26 18 - 29 mmol/L   Calcium 8.5 (L) 8.7 - 10.3 mg/dL      Assessment & Plan:   Problem List Items Addressed This Visit      Cardiovascular and Mediastinum   NICM (nonischemic cardiomyopathy) ([place])    Continue to follow with Dr. Rockey Situ. Stable at this time.        HTN (hypertension)    Under good control today. Continue current regimen. Continue to monitor.         Endocrine   Hypothyroidism    Due for recheck today. TSH drawn. Will adjust dose as needed tomorrow and change dose as needed.         Musculoskeletal and Integument   Osteoarthritis of right hip    Has gotten worse. Knows that she needs a hip replacement. Going to have carotid ultrasound and potential carotid endarterectomy done first. We will increase her pain medicine to TID PRN and monitor. She will see orthopedics for eval for hip replacement when her carotid is taken care of.      Relevant Medications   HYDROcodone-acetaminophen (NORCO) 10-325 MG tablet     Genitourinary   ARF (acute renal failure) ([facility])    Continue to follow with Dr. Myrtie Cruise. Labs checked again today.  Await results.         Other   Hyperlipidemia    Picking up her cholesterol medicine. To have it checked again by Dr. Rockey Situ on 12/14       Other Visit Diagnoses    Acute bronchitis, unspecified organism    -  Primary    Better after neb. Starting albuterol and levaquin. Return for recheck on lungs in 2-3 weeks. Call if not getting better or getting worse.     Screening for osteoporosis        DEXA ordered today    Relevant Orders    DG Bone Density    Wheezing        Better following neb. appears to be bronchitis, no audible ronchi. Will treat with albuterol and levaquin.     Relevant Medications    albuterol (PROVENTIL) (2.5 MG/3ML) 0.083% nebulizer solution 2.5 mg        Follow up plan: Return 2-3 weeks, for Wellness exam and Lung check.

## 2015-04-11 NOTE — Assessment & Plan Note (Signed)
Under good control today. Continue current regimen. Continue to monitor.  

## 2015-04-11 NOTE — Assessment & Plan Note (Signed)
Continue to follow with Dr. Myrtie Cruise. Labs checked again today. Await results.

## 2015-04-11 NOTE — Assessment & Plan Note (Signed)
Continue to follow with Dr. Rockey Situ. Stable at this time.

## 2015-04-11 NOTE — Assessment & Plan Note (Signed)
Due for recheck today. TSH drawn. Will adjust dose as needed tomorrow and change dose as needed.

## 2015-04-11 NOTE — Assessment & Plan Note (Signed)
Has gotten worse. Knows that she needs a hip replacement. Going to have carotid ultrasound and potential carotid endarterectomy done first. We will increase her pain medicine to TID PRN and monitor. She will see orthopedics for eval for hip replacement when her carotid is taken care of.

## 2015-04-11 NOTE — Patient Instructions (Signed)
Advance Directive Advance directives are the legal documents that allow you to make choices about your health care and medical treatment if you cannot speak for yourself. Advance directives are a way for you to communicate your wishes to family, friends, and health care providers. The specified people can then convey your decisions about end-of-life care to avoid confusion if you should become unable to communicate. Ideally, the process of discussing and writing advance directives should happen over time rather than making decisions all at once. Advance directives can be modified as your situation changes, and you can change your mind at any time, even after you have signed the advance directives. Each state has its own laws regarding advance directives. You may want to check with your health care provider, attorney, or state representative about the law in your state. Below are some examples of advance directives. LIVING WILL A living will is a set of instructions documenting your wishes about medical care when you cannot care for yourself. It is used if you become:  Terminally ill.  Incapacitated.  Unable to communicate.  Unable to make decisions. Items to consider in your living will include:  The use or non-use of life-sustaining equipment, such as dialysis machines and breathing machines (ventilators).  A do not resuscitate (DNR) order, which is the instruction not to use cardiopulmonary resuscitation (CPR) if breathing or heartbeat stops.  Tube feeding.  Withholding of food and fluids.  Comfort (palliative) care when the goal becomes comfort rather than a cure.  Organ and tissue donation. A living will does not give instructions about distribution of your money and property if you should pass away. It is advisable to seek the expert advice of a lawyer in drawing up a will regarding your possessions. Decisions about taxes, beneficiaries, and asset distribution will be legally binding.  This process can relieve your family and friends of any burdens surrounding disputes or questions that may come up about the allocation of your assets. DO NOT RESUSCITATE (DNR) A do not resuscitate (DNR) order is a request to not have CPR in the event that your heart stops beating or you stop breathing. Unless given other instructions, a health care provider will try to help any patient whose heart has stopped or who has stopped breathing.  HEALTH CARE PROXY AND DURABLE POWER OF ATTORNEY FOR HEALTH CARE A health care proxy is a person (agent) appointed to make medical decisions for you if you cannot. Generally, people choose someone they know well and trust to represent their preferences when they can no longer do so. You should be sure to ask this person for agreement to act as your agent. An agent may have to exercise judgment in the event of a medical decision for which your wishes are not known. The durable power of attorney for health care is the legal document that names your health care proxy. Once written, it should be:  Signed.  Notarized.  Dated.  Copied.  Witnessed.  Incorporated into your medical record. You may also want to appoint someone to manage your financial affairs if you cannot. This is called a durable power of attorney for finances. It is a separate legal document from the durable power of attorney for health care. You may choose the same person or someone different from your health care proxy to act as your agent in financial matters.   This information is not intended to replace advice given to you by your health care provider. Make sure you discuss any   questions you have with your health care provider.   Document Released: 07/30/2007 Document Revised: 04/27/2013 Document Reviewed: 09/09/2012 Elsevier Interactive Patient Education 2016 [employer]  

## 2015-04-11 NOTE — Progress Notes (Signed)
ROS  Physical Exam     

## 2015-04-11 NOTE — Assessment & Plan Note (Addendum)
Picking up her cholesterol medicine. To have it checked again by Dr. Rockey Situ on 12/14

## 2015-04-12 ENCOUNTER — Telehealth: Payer: Self-pay | Admitting: Family Medicine

## 2015-04-12 LAB — COMPREHENSIVE METABOLIC PANEL
ALBUMIN: 3.7 g/dL (ref 3.6–4.8)
ALT: 5 IU/L (ref 0–32)
AST: 16 IU/L (ref 0–40)
Albumin/Globulin Ratio: 1.5 (ref 1.1–2.5)
Alkaline Phosphatase: 81 IU/L (ref 39–117)
BUN / CREAT RATIO: 13 (ref 11–26)
BUN: 12 mg/dL (ref 8–27)
CALCIUM: 8.2 mg/dL — AB (ref 8.7–10.3)
CHLORIDE: 99 mmol/L (ref 97–106)
CO2: 20 mmol/L (ref 18–29)
Creatinine, Ser: 0.94 mg/dL (ref 0.57–1.00)
GFR, EST AFRICAN AMERICAN: 74 mL/min/{1.73_m2} (ref >59)
GFR, EST NON AFRICAN AMERICAN: 64 mL/min/{1.73_m2} (ref >59)
GLUCOSE: 87 mg/dL (ref 65–99)
Globulin, Total: 2.4 g/dL (ref 1.5–4.5)
Potassium: 5 mmol/L (ref 3.5–5.2)
Sodium: 136 mmol/L (ref 136–144)
TOTAL PROTEIN: 6.1 g/dL (ref 6.0–8.5)

## 2015-04-12 LAB — CBC WITH DIFFERENTIAL/PLATELET
BASOS ABS: 0 10*3/uL (ref 0.0–0.2)
Basos: 1 %
EOS (ABSOLUTE): 0.4 10*3/uL (ref 0.0–0.4)
EOS: 6 %
HEMATOCRIT: 30.7 % — AB (ref 34.0–46.6)
HEMOGLOBIN: 9.6 g/dL — AB (ref 11.1–15.9)
IMMATURE GRANS (ABS): 0 10*3/uL (ref 0.0–0.1)
IMMATURE GRANULOCYTES: 0 %
LYMPHS: 32 %
Lymphocytes Absolute: 2.2 10*3/uL (ref 0.7–3.1)
MCH: 25.6 pg — ABNORMAL LOW (ref 26.6–33.0)
MCHC: 31.3 g/dL — AB (ref 31.5–35.7)
MCV: 82 fL (ref 79–97)
MONOCYTES: 7 %
Monocytes Absolute: 0.5 10*3/uL (ref 0.1–0.9)
NEUTROS PCT: 54 %
Neutrophils Absolute: 3.6 10*3/uL (ref 1.4–7.0)
Platelets: 450 10*3/uL — ABNORMAL HIGH (ref 150–379)
RBC: 3.75 x10E6/uL — AB (ref 3.77–5.28)
RDW: 18.5 % — ABNORMAL HIGH (ref 12.3–15.4)
WBC: 6.8 10*3/uL (ref 3.4–10.8)

## 2015-04-12 LAB — LIPID PANEL W/O CHOL/HDL RATIO
Cholesterol, Total: 206 mg/dL — ABNORMAL HIGH (ref 100–199)
HDL: 34 mg/dL — AB (ref >39)
LDL CALC: 131 mg/dL — AB (ref 0–99)
Triglycerides: 206 mg/dL — ABNORMAL HIGH (ref 0–149)
VLDL CHOLESTEROL CAL: 41 mg/dL — AB (ref 5–40)

## 2015-04-12 LAB — TSH: TSH: 16.56 u[IU]/mL — AB (ref 0.450–4.500)

## 2015-04-12 MED ORDER — LEVOTHYROXINE SODIUM 88 MCG PO TABS: 88.0000 ug | ORAL_TABLET | Freq: Every day | ORAL | Status: DC

## 2015-04-12 NOTE — Telephone Encounter (Signed)
Called and spoke to [place]. Cholesterol better with the better thyroid, but still should take crestor. She will pick it up. Thyroid better, but needs increase. Will go to 42mcg and recheck in 6 weeks, Anemic again. States iron doesn't work. Will increase greens and liver intake and monitor.

## 2015-04-13 LAB — IRON AND TIBC
IRON: 16 ug/dL — AB (ref 27–139)
Iron Saturation: 5 % — CL (ref 15–55)
Total Iron Binding Capacity: 337 ug/dL (ref 250–450)
UIBC: 321 ug/dL (ref 118–369)

## 2015-04-13 LAB — SPECIMEN STATUS REPORT

## 2015-04-13 LAB — FERRITIN: Ferritin: 10 ng/mL — ABNORMAL LOW (ref 15–150)

## 2015-04-13 LAB — UA/M W/RFLX CULTURE, ROUTINE

## 2015-04-19 ENCOUNTER — Other Ambulatory Visit: Payer: Self-pay

## 2015-04-19 ENCOUNTER — Ambulatory Visit: Payer: Medicare Other

## 2015-04-19 DIAGNOSIS — I6529 Occlusion and stenosis of unspecified carotid artery: Secondary | ICD-10-CM

## 2015-04-19 DIAGNOSIS — R079 Chest pain, unspecified: Secondary | ICD-10-CM

## 2015-04-19 DIAGNOSIS — R0989 Other specified symptoms and signs involving the circulatory and respiratory systems: Secondary | ICD-10-CM

## 2015-04-19 NOTE — Progress Notes (Addendum)
Pt sched to see Dr. Lucky Cowboy @ [facility] on 05/08/14 @ 3:00. Left message on pt's vm w/ appt date & time.  Asked her to call back w/ any questions or concerns.

## 2015-04-19 NOTE — Progress Notes (Signed)
Pt presented to office for carotid u/s, found to have significant stenosis, Dr. Rockey Situ advised. Pt sched for neck CT angio @ [facility] on Tuesday, Dec 20 @ 1:00, pt to arrive @ 12:45. Attempted to schedule pt to be seen at [facility], but they are currently closed for lunch.  Will reattempt to contact their office after 1:00.

## 2015-04-25 ENCOUNTER — Ambulatory Visit
Admission: RE | Admit: 2015-04-25 | Discharge: 2015-04-25 | Disposition: A | Discharge status: Home / Self Care | Payer: Medicare Other | Source: Ambulatory Visit | Attending: Cardiovascular Disease | Admitting: Cardiovascular Disease

## 2015-04-25 DIAGNOSIS — I771 Stricture of artery: Secondary | ICD-10-CM | POA: Diagnosis not present

## 2015-04-25 DIAGNOSIS — I6522 Occlusion and stenosis of left carotid artery: Secondary | ICD-10-CM | POA: Diagnosis not present

## 2015-04-25 DIAGNOSIS — I6529 Occlusion and stenosis of unspecified carotid artery: Secondary | ICD-10-CM

## 2015-04-25 HISTORY — DX: Systemic involvement of connective tissue, unspecified: M35.9

## 2015-04-25 MED ORDER — IOHEXOL 350 MG/ML SOLN
80.0000 mL | Freq: Once | INTRAVENOUS | Status: AC | PRN
  Administered 2015-04-25: 80 mL via INTRAVENOUS

## 2015-05-02 ENCOUNTER — Encounter: Payer: Medicare Other | Admitting: Family Medicine

## 2015-05-09 ENCOUNTER — Other Ambulatory Visit: Payer: Self-pay | Admitting: Family Medicine

## 2015-05-09 MED ORDER — HYDROCODONE-ACETAMINOPHEN 10-325 MG PO TABS: 1.0000 | ORAL_TABLET | Freq: Three times a day (TID) | ORAL | Status: DC | PRN

## 2015-05-22 ENCOUNTER — Telehealth: Payer: Self-pay

## 2015-05-22 NOTE — Telephone Encounter (Signed)
Patients pharmacy has changed, her levothyroxine will be manufactured through a different company , they want to make sure that is ok. Spoke with Dr.Johnson, she gave a verbal ok.  Patient should let us know if she notices a difference when switching.

## 2015-05-31 ENCOUNTER — Other Ambulatory Visit: Payer: Self-pay | Admitting: Family Medicine

## 2015-05-31 MED ORDER — HYDROCODONE-ACETAMINOPHEN 10-325 MG PO TABS: 1.0000 | ORAL_TABLET | Freq: Three times a day (TID) | ORAL | Status: DC | PRN

## 2015-06-01 ENCOUNTER — Telehealth: Payer: Self-pay

## 2015-06-01 NOTE — Telephone Encounter (Signed)
Received cardiac clearance request for pt to proceed w/ left CEA w/ [facility], date is pending.  Per Dr. Rockey Situ, pt has been cleared to proceed.  Faxed to [phone].

## 2015-06-06 ENCOUNTER — Other Ambulatory Visit: Payer: Self-pay | Admitting: Vascular Surgery

## 2015-06-08 ENCOUNTER — Ambulatory Visit (INDEPENDENT_AMBULATORY_CARE_PROVIDER_SITE_OTHER): Payer: Medicare Other | Admitting: Family Medicine

## 2015-06-08 ENCOUNTER — Encounter: Payer: Self-pay | Admitting: Family Medicine

## 2015-06-08 VITALS — BP 120/72 | HR 88 | Temp 98.4°F | Ht 62.3 in | Wt 134.0 lb

## 2015-06-08 DIAGNOSIS — Z01818 Encounter for other preprocedural examination: Secondary | ICD-10-CM

## 2015-06-08 DIAGNOSIS — M1611 Unilateral primary osteoarthritis, right hip: Secondary | ICD-10-CM | POA: Diagnosis not present

## 2015-06-08 DIAGNOSIS — E039 Hypothyroidism, unspecified: Secondary | ICD-10-CM

## 2015-06-08 DIAGNOSIS — E785 Hyperlipidemia, unspecified: Secondary | ICD-10-CM

## 2015-06-08 DIAGNOSIS — D649 Anemia, unspecified: Secondary | ICD-10-CM | POA: Diagnosis not present

## 2015-06-08 DIAGNOSIS — D509 Iron deficiency anemia, unspecified: Secondary | ICD-10-CM

## 2015-06-08 DIAGNOSIS — N179 Acute kidney failure, unspecified: Secondary | ICD-10-CM

## 2015-06-08 NOTE — Progress Notes (Signed)
BP 120/72 mmHg  Pulse 88  Temp(Src) 98.4 F (36.9 C)  Ht 5' 2.3" (1.582 m)  Wt 134 lb (60.782 kg)  BMI 24.29 kg/m2  SpO2 95%   Subjective:    Patient ID: [name], female    DOB: 31-Jul-1950, 65 y.o.   MRN: [id number]  HPI: [name] is a 65 y.o. female  Chief Complaint  Patient presents with  . Fatigue   To have an endarterectomy done on 06/22/15 with her vascular surgeon. She states that she has not been feeling like herself. Has been slightly confused. Did not go to the hospital for her pre-op clearance yesterday. Will draw some of her labs today for that and forward them to Dr. Lucky Cowboy.  Started on cholesterol medicine with her cardiologist.  Had an episode of acute renal failure and has been following with her kidney doctor. Had an ultrasound done of her kidneys with him.  At last visit, her thyroid was still off and she was slightly anemic. Missed last appointment with [place] for wellness exam and follow up on her lungs. Hip still hurting. Pain medicine was increased to TID. To see orthopedics after the endarterectomy.  HYPOTHYROIDISM- really dry skin Thyroid control status:uncontrolled Satisfied with current treatment? no Medication side effects: no Medication compliance: good compliance Recent dose adjustment:yes  Fatigue: yes Cold intolerance: yes Heat intolerance: no Weight gain: no Weight loss: no Constipation: yes Diarrhea/loose stools: no Palpitations: no Lower extremity edema: no Anxiety/depressed mood: yes  ANEMIA Anemia status: better Etiology of anemia: iron def Duration of anemia treatment: can't tolerate oral iron Compliance with treatment: can't take the iron medicine- doesn't do anything, did the IV iron in the past at [place] supplementation side effects: yes Severity of anemia: mild Fatigue: yes Decreased exercise tolerance: yes  Dyspnea on exertion: yes Palpitations: no Bleeding: no Pica: yes   Atorvastatin, Crestor,  pravastain, simvastatin- all gave her muscle aches  Relevant past medical, surgical, family and social history reviewed and updated as indicated. Interim medical history since our last visit reviewed. Allergies and medications reviewed and updated.  Review of Systems  Constitutional: Negative.   Respiratory: Negative.   Cardiovascular: Negative.   Musculoskeletal: Positive for myalgias, back pain, arthralgias and gait problem. Negative for joint swelling, neck pain and neck stiffness.  Psychiatric/Behavioral: Negative.     Per HPI unless specifically indicated above     Objective:    BP 120/72 mmHg  Pulse 88  Temp(Src) 98.4 F (36.9 C)  Ht 5' 2.3" (1.582 m)  Wt 134 lb (60.782 kg)  BMI 24.29 kg/m2  SpO2 95%  Wt Readings from Last 3 Encounters:  06/08/15 134 lb (60.782 kg)  04/11/15 139 lb (63.05 kg)  04/03/15 141 lb (63.957 kg)    Physical Exam  Constitutional: She is oriented to person, place, and time. She appears well-developed and well-nourished. No distress.  HENT:  Head: Normocephalic and atraumatic.  Right Ear: Hearing and external ear normal.  Left Ear: Hearing and external ear normal.  Nose: Nose normal.  Mouth/Throat: Oropharynx is clear and moist. No oropharyngeal exudate.  Eyes: Conjunctivae, EOM and lids are normal. Pupils are equal, round, and reactive to light. Right eye exhibits no discharge. Left eye exhibits no discharge. No scleral icterus.  Cardiovascular: Normal rate, regular rhythm and intact distal pulses.  Exam reveals no gallop and no friction rub.   Murmur heard. Pulmonary/Chest: Effort normal. No respiratory distress. She has no wheezes. She has no rales. She  exhibits no tenderness.  Musculoskeletal: Normal range of motion.  Neurological: She is alert and oriented to person, place, and time.  Skin: Skin is warm, dry and intact. No rash noted. She is not diaphoretic. No erythema. No pallor.  Psychiatric: She has a normal mood and affect. Her  speech is normal and behavior is normal. Judgment and thought content normal. Cognition and memory are normal.    Results for orders placed or performed in visit on 06/08/15  Lipid Panel Piccolo, [employer]  Result Value Ref Range   Cholesterol Piccolo, Waived 194 <200 mg/dL   HDL Chol Piccolo, Waived 48 (L) >59 mg/dL   Triglycerides Piccolo,Waived 284 (H) <150 mg/dL   Chol/HDL Ratio Piccolo,Waive 4.1 mg/dL   LDL Chol Calc Piccolo Waived 90 <100 mg/dL   VLDL Chol Calc Piccolo,Waive 57 (H) <30 mg/dL  Comprehensive metabolic panel  Result Value Ref Range   Glucose 102 (H) 65 - 99 mg/dL   BUN 13 8 - 27 mg/dL   Creatinine, Ser 1.01 (H) 0.57 - 1.00 mg/dL   GFR calc non Af Amer 59 (L) >59 mL/min/1.73   GFR calc Af Amer 68 >59 mL/min/1.73   BUN/Creatinine Ratio 13 11 - 26   Sodium 143 134 - 144 mmol/L   Potassium 4.4 3.5 - 5.2 mmol/L   Chloride 98 96 - 106 mmol/L   CO2 28 18 - 29 mmol/L   Calcium 8.7 8.7 - 10.3 mg/dL   Total Protein 6.2 6.0 - 8.5 g/dL   Albumin 3.7 3.6 - 4.8 g/dL   Globulin, Total 2.5 1.5 - 4.5 g/dL   Albumin/Globulin Ratio 1.5 1.1 - 2.5   Bilirubin Total <0.2 0.0 - 1.2 mg/dL   Alkaline Phosphatase 85 39 - 117 IU/L   AST 15 0 - 40 IU/L   ALT 5 0 - 32 IU/L  CBC With Differential/Platelet  Result Value Ref Range   WBC 7.9 3.4 - 10.8 x10E3/uL   RBC 3.97 3.77 - 5.28 x10E6/uL   Hemoglobin 10.0 (L) 11.1 - 15.9 g/dL   Hematocrit 32.5 (L) 34.0 - 46.6 %   MCV 82 79 - 97 fL   MCH 25.2 (L) 26.6 - 33.0 pg   MCHC 30.8 (L) 31.5 - 35.7 g/dL   RDW 18.6 (H) 12.3 - 15.4 %   Platelets 497 (H) 150 - 379 x10E3/uL   Neutrophils 67 %   Lymphs 24 %   MID 9 %   Neutrophils Absolute 5.3 1.4 - 7.0 x10E3/uL   Lymphocytes Absolute 1.9 0.7 - 3.1 x10E3/uL   MID (Absolute) 0.7 0.1 - 1.6 X10E3/uL  Thyroid Panel With TSH  Result Value Ref Range   TSH 0.458 0.450 - 4.500 uIU/mL   T4, Total 8.2 4.5 - 12.0 ug/dL   T3 Uptake Ratio 28 24 - 39 %   Free Thyroxine Index 2.3 1.2 - 4.9  Ferritin   Result Value Ref Range   Ferritin 7 (L) 15 - 150 ng/mL  B12 and Folate Panel  Result Value Ref Range   Vitamin B-12 273 211 - 946 pg/mL   Folate 3.9 >3.0 ng/mL  APTT  Result Value Ref Range   aPTT CANCELED sec  INR/PT  Result Value Ref Range   INR CANCELED    Prothrombin Time CANCELED   Protime-INR  Result Value Ref Range   INR 1.0 0.8 - 1.2   Prothrombin Time 10.0 9.1 - 12.0 sec  APTT  Result Value Ref Range   aPTT 25 24 - 33  sec      Assessment & Plan:   Problem List Items Addressed This Visit      Endocrine   Hypothyroidism - Primary    Due for recheck on her medicine. Labs drawn today.      Relevant Medications   carvedilol (COREG) 6.25 MG tablet   Other Relevant Orders   Comprehensive metabolic panel (Completed)   Thyroid Panel With TSH (Completed)     Musculoskeletal and Integument   Osteoarthritis of right hip    Stable. Doing well on current regimen. To see orthopedics after endarterectomy.        Genitourinary   ARF (acute renal failure) (HCC)    Had been doing better. Rechecking today.        Other   Hyperlipidemia    Better on the every other day crestor. She is not sure if that's what's causing her muscle aches and fatigue, will wait on other results and then will have her hold it for a week and see if it gets better.       Relevant Medications   carvedilol (COREG) 6.25 MG tablet   Other Relevant Orders   Lipid Panel Piccolo, Waived (Completed)   Comprehensive metabolic panel (Completed)   Iron deficiency anemia    Not doing well with oral iron. Will refer to hematology for evaluation and possible IV iron.        Other Visit Diagnoses    Preop examination        Missed appointment yesterday, will draw some of the labs for them today to save her a stick.    Relevant Orders    APTT (Completed)    INR/PT (Completed)    ABO/Rh        Follow up plan: Return in about 4 weeks (around 07/06/2015).

## 2015-06-09 ENCOUNTER — Telehealth: Payer: Self-pay | Admitting: Family Medicine

## 2015-06-09 DIAGNOSIS — D509 Iron deficiency anemia, unspecified: Secondary | ICD-10-CM | POA: Insufficient documentation

## 2015-06-09 LAB — LIPID PANEL PICCOLO, WAIVED
Chol/HDL Ratio Piccolo,Waive: 4.1 mg/dL
Cholesterol Piccolo, Waived: 194 mg/dL (ref <200)
HDL Chol Piccolo, Waived: 48 mg/dL — ABNORMAL LOW (ref >59)
LDL Chol Calc Piccolo Waived: 90 mg/dL (ref <100)
Triglycerides Piccolo,Waived: 284 mg/dL — ABNORMAL HIGH (ref <150)
VLDL Chol Calc Piccolo,Waive: 57 mg/dL — ABNORMAL HIGH (ref <30)

## 2015-06-09 LAB — THYROID PANEL WITH TSH
FREE THYROXINE INDEX: 2.3 (ref 1.2–4.9)
T3 Uptake Ratio: 28 % (ref 24–39)
T4, Total: 8.2 ug/dL (ref 4.5–12.0)
TSH: 0.458 u[IU]/mL (ref 0.450–4.500)

## 2015-06-09 LAB — COMPREHENSIVE METABOLIC PANEL
ALBUMIN: 3.7 g/dL (ref 3.6–4.8)
ALK PHOS: 85 IU/L (ref 39–117)
ALT: 5 IU/L (ref 0–32)
AST: 15 IU/L (ref 0–40)
Albumin/Globulin Ratio: 1.5 (ref 1.1–2.5)
BUN / CREAT RATIO: 13 (ref 11–26)
BUN: 13 mg/dL (ref 8–27)
Bilirubin Total: <0.2 mg/dL (ref 0.0–1.2)
CO2: 28 mmol/L (ref 18–29)
CREATININE: 1.01 mg/dL — AB (ref 0.57–1.00)
Calcium: 8.7 mg/dL (ref 8.7–10.3)
Chloride: 98 mmol/L (ref 96–106)
GFR calc non Af Amer: 59 mL/min/{1.73_m2} — ABNORMAL LOW (ref >59)
GFR, EST AFRICAN AMERICAN: 68 mL/min/{1.73_m2} (ref >59)
GLUCOSE: 102 mg/dL — AB (ref 65–99)
Globulin, Total: 2.5 g/dL (ref 1.5–4.5)
Potassium: 4.4 mmol/L (ref 3.5–5.2)
Sodium: 143 mmol/L (ref 134–144)
TOTAL PROTEIN: 6.2 g/dL (ref 6.0–8.5)

## 2015-06-09 LAB — CBC WITH DIFFERENTIAL/PLATELET
Hematocrit: 32.5 % — ABNORMAL LOW (ref 34.0–46.6)
Hemoglobin: 10 g/dL — ABNORMAL LOW (ref 11.1–15.9)
LYMPHS: 24 %
Lymphocytes Absolute: 1.9 10*3/uL (ref 0.7–3.1)
MCH: 25.2 pg — AB (ref 26.6–33.0)
MCHC: 30.8 g/dL — ABNORMAL LOW (ref 31.5–35.7)
MCV: 82 fL (ref 79–97)
MID (Absolute): 0.7 10*3/uL (ref 0.1–1.6)
MID: 9 %
NEUTROS ABS: 5.3 10*3/uL (ref 1.4–7.0)
Neutrophils: 67 %
PLATELETS: 497 10*3/uL — AB (ref 150–379)
RBC: 3.97 x10E6/uL (ref 3.77–5.28)
RDW: 18.6 % — AB (ref 12.3–15.4)
WBC: 7.9 10*3/uL (ref 3.4–10.8)

## 2015-06-09 LAB — B12 AND FOLATE PANEL
Folate: 3.9 ng/mL (ref >3.0)
Vitamin B-12: 273 pg/mL (ref 211–946)

## 2015-06-09 LAB — PROTIME-INR
INR: 1 (ref 0.8–1.2)
Prothrombin Time: 10 s (ref 9.1–12.0)

## 2015-06-09 LAB — APTT: APTT: 25 s (ref 24–33)

## 2015-06-09 LAB — FERRITIN: Ferritin: 7 ng/mL — ABNORMAL LOW (ref 15–150)

## 2015-06-09 NOTE — Addendum Note (Signed)
Addended by: Valerie Roys on: 06/09/2015 08:15 AM   Modules accepted: Orders

## 2015-06-09 NOTE — Telephone Encounter (Signed)
Called and spoke to [place] about her results. Stay on same dose of thyroid medicine for now. To see hematology. Work on increasing fluids. Continue to monitor.

## 2015-06-09 NOTE — Assessment & Plan Note (Signed)
Better on the every other day crestor. She is not sure if that's what's causing her muscle aches and fatigue, will wait on other results and then will have her hold it for a week and see if it gets better.

## 2015-06-09 NOTE — Assessment & Plan Note (Signed)
Had been doing better. Rechecking today.

## 2015-06-09 NOTE — Assessment & Plan Note (Signed)
Due for recheck on her medicine. Labs drawn today.

## 2015-06-09 NOTE — Assessment & Plan Note (Signed)
Stable. Doing well on current regimen. To see orthopedics after endarterectomy.

## 2015-06-09 NOTE — Assessment & Plan Note (Signed)
Not doing well with oral iron. Will refer to hematology for evaluation and possible IV iron.

## 2015-06-13 ENCOUNTER — Encounter
Admission: RE | Admit: 2015-06-13 | Discharge: 2015-06-13 | Disposition: A | Discharge status: Home / Self Care | Payer: Medicare Other | Source: Ambulatory Visit | Attending: Vascular Surgery | Admitting: Vascular Surgery

## 2015-06-13 DIAGNOSIS — Z01812 Encounter for preprocedural laboratory examination: Secondary | ICD-10-CM | POA: Diagnosis not present

## 2015-06-13 HISTORY — DX: Pain in right hip: M25.551

## 2015-06-13 HISTORY — DX: Other chronic pain: G89.29

## 2015-06-13 HISTORY — DX: Gastro-esophageal reflux disease without esophagitis: K21.9

## 2015-06-13 LAB — TYPE AND SCREEN
ABO/RH(D): O NEG
ANTIBODY SCREEN: NEGATIVE

## 2015-06-13 LAB — CBC WITH DIFFERENTIAL/PLATELET
Basophils Absolute: 0.1 10*3/uL (ref 0–0.1)
Basophils Relative: 1 %
EOS ABS: 0.4 10*3/uL (ref 0–0.7)
Eosinophils Relative: 6 %
HEMATOCRIT: 30.6 % — AB (ref 35.0–47.0)
HEMOGLOBIN: 9.5 g/dL — AB (ref 12.0–16.0)
LYMPHS ABS: 1.9 10*3/uL (ref 1.0–3.6)
Lymphocytes Relative: 25 %
MCH: 23.5 pg — ABNORMAL LOW (ref 26.0–34.0)
MCHC: 31.1 g/dL — ABNORMAL LOW (ref 32.0–36.0)
MCV: 75.7 fL — AB (ref 80.0–100.0)
MONO ABS: 0.6 10*3/uL (ref 0.2–0.9)
MONOS PCT: 8 %
NEUTROS PCT: 60 %
Neutro Abs: 4.5 10*3/uL (ref 1.4–6.5)
PLATELETS: 431 10*3/uL (ref 150–440)
RBC: 4.04 MIL/uL (ref 3.80–5.20)
RDW: 18.3 % — ABNORMAL HIGH (ref 11.5–14.5)
WBC: 7.4 10*3/uL (ref 3.6–11.0)

## 2015-06-13 LAB — BASIC METABOLIC PANEL
Anion gap: 9 (ref 5–15)
BUN: 10 mg/dL (ref 6–20)
CALCIUM: 8.8 mg/dL — AB (ref 8.9–10.3)
CO2: 27 mmol/L (ref 22–32)
CREATININE: 0.93 mg/dL (ref 0.44–1.00)
Chloride: 102 mmol/L (ref 101–111)
Glucose, Bld: 98 mg/dL (ref 65–99)
Potassium: 4.9 mmol/L (ref 3.5–5.1)
SODIUM: 138 mmol/L (ref 135–145)

## 2015-06-13 LAB — PROTIME-INR
INR: 0.93
Prothrombin Time: 12.7 seconds (ref 11.4–15.0)

## 2015-06-13 LAB — APTT: APTT: 29 s (ref 24–36)

## 2015-06-13 LAB — SURGICAL PCR SCREEN
MRSA, PCR: NEGATIVE
Staphylococcus aureus: NEGATIVE

## 2015-06-13 LAB — ABO/RH: ABO/RH(D): O NEG

## 2015-06-13 NOTE — Patient Instructions (Signed)
  Your procedure is scheduled on: June 22, 2015 (Thursday) Report to Day Surgery.[facility]) Second Floor To find out your arrival time please call [phone] between 1PM - 3PM on June 21, 2015 (Wednesday).  Remember: Instructions that are not followed completely may result in serious medical risk, up to and including death, or upon the discretion of your surgeon and anesthesiologist your surgery may need to be rescheduled.    __x__ 1. Do not eat food or drink liquids after midnight. No gum chewing or hard candies.     __x__ 2. No Alcohol for 24 hours before or after surgery.   ____ 3. Bring all medications with you on the day of surgery if instructed.    __x__ 4. Notify your doctor if there is any change in your medical condition     (cold, fever, infections).     Do not wear jewelry, make-up, hairpins, clips or nail polish.  Do not wear lotions, powders, or perfumes. You may wear deodorant.  Do not shave 48 hours prior to surgery. Men may shave face and neck.  Do not bring valuables to the hospital.    [facility] is not responsible for any belongings or valuables.               Contacts, dentures or bridgework may not be worn into surgery.  Leave your suitcase in the car. After surgery it may be brought to your room.  For patients admitted to the hospital, discharge time is determined by your                treatment team.   Patients discharged the day of surgery will not be allowed to drive home.   Please read over the following fact sheets that you were given:   MRSA Information and Surgical Site Infection Prevention   ____ Take these medicines the morning of surgery with A SIP OF WATER:    1. Carvedilol  2. Gabapentin   3. Prilosec (Prilosec at bedtime on February 15)   4. Levothyroxine   ____ Fleet Enema (as directed)   _x___ Use CHG Soap as directed  _x___ Use inhalers on the day of surgery (Use Albuterol inhaler the morning of surgery and bring to  hospital )  ____ Stop metformin 2 days prior to surgery    ____ Take 1/2 of usual insulin dose the night before surgery and none on the morning of surgery.   _x__ Stop Coumadin/Plavix/aspirin on (Do not take Aspirin the morning of surgery)  _x___ Stop Anti-inflammatories on (NO NSAIDS) CAN TAKE NORCO IF NEEDED   ____ Stop supplements until after surgery.    ____ Bring C-Pap to the hospital.

## 2015-06-22 ENCOUNTER — Inpatient Hospital Stay: Payer: Medicare Other | Admitting: Certified Registered Nurse Anesthetist

## 2015-06-22 ENCOUNTER — Inpatient Hospital Stay: Payer: Medicare Other | Attending: Internal Medicine | Admitting: Internal Medicine

## 2015-06-22 ENCOUNTER — Encounter
Admission: RE | Disposition: A | Discharge status: Home / Self Care | Payer: Self-pay | Source: Ambulatory Visit | Attending: Vascular Surgery

## 2015-06-22 ENCOUNTER — Inpatient Hospital Stay
Admission: RE | Admit: 2015-06-22 | Discharge: 2015-06-23 | DRG: 038 | Disposition: A | Discharge status: Home / Self Care | Payer: Medicare Other | Source: Ambulatory Visit | Attending: Vascular Surgery | Admitting: Vascular Surgery

## 2015-06-22 ENCOUNTER — Encounter: Payer: Self-pay | Admitting: *Deleted

## 2015-06-22 DIAGNOSIS — K219 Gastro-esophageal reflux disease without esophagitis: Secondary | ICD-10-CM | POA: Diagnosis present

## 2015-06-22 DIAGNOSIS — F329 Major depressive disorder, single episode, unspecified: Secondary | ICD-10-CM | POA: Diagnosis present

## 2015-06-22 DIAGNOSIS — E785 Hyperlipidemia, unspecified: Secondary | ICD-10-CM | POA: Diagnosis present

## 2015-06-22 DIAGNOSIS — E039 Hypothyroidism, unspecified: Secondary | ICD-10-CM | POA: Diagnosis present

## 2015-06-22 DIAGNOSIS — G8929 Other chronic pain: Secondary | ICD-10-CM | POA: Diagnosis present

## 2015-06-22 DIAGNOSIS — I110 Hypertensive heart disease with heart failure: Secondary | ICD-10-CM | POA: Diagnosis not present

## 2015-06-22 DIAGNOSIS — I429 Cardiomyopathy, unspecified: Secondary | ICD-10-CM | POA: Diagnosis present

## 2015-06-22 DIAGNOSIS — I252 Old myocardial infarction: Secondary | ICD-10-CM

## 2015-06-22 DIAGNOSIS — M4800 Spinal stenosis, site unspecified: Secondary | ICD-10-CM | POA: Diagnosis present

## 2015-06-22 DIAGNOSIS — I6529 Occlusion and stenosis of unspecified carotid artery: Secondary | ICD-10-CM | POA: Diagnosis present

## 2015-06-22 DIAGNOSIS — M13851 Other specified arthritis, right hip: Secondary | ICD-10-CM | POA: Diagnosis present

## 2015-06-22 DIAGNOSIS — R0902 Hypoxemia: Secondary | ICD-10-CM | POA: Diagnosis not present

## 2015-06-22 DIAGNOSIS — F418 Other specified anxiety disorders: Secondary | ICD-10-CM | POA: Diagnosis present

## 2015-06-22 DIAGNOSIS — F17200 Nicotine dependence, unspecified, uncomplicated: Secondary | ICD-10-CM | POA: Diagnosis not present

## 2015-06-22 DIAGNOSIS — F17210 Nicotine dependence, cigarettes, uncomplicated: Secondary | ICD-10-CM | POA: Diagnosis not present

## 2015-06-22 DIAGNOSIS — J449 Chronic obstructive pulmonary disease, unspecified: Secondary | ICD-10-CM | POA: Diagnosis present

## 2015-06-22 DIAGNOSIS — I5022 Chronic systolic (congestive) heart failure: Secondary | ICD-10-CM | POA: Diagnosis present

## 2015-06-22 DIAGNOSIS — I2510 Atherosclerotic heart disease of native coronary artery without angina pectoris: Secondary | ICD-10-CM | POA: Diagnosis not present

## 2015-06-22 DIAGNOSIS — M545 Low back pain: Secondary | ICD-10-CM | POA: Diagnosis present

## 2015-06-22 DIAGNOSIS — Z9981 Dependence on supplemental oxygen: Secondary | ICD-10-CM

## 2015-06-22 DIAGNOSIS — R062 Wheezing: Secondary | ICD-10-CM

## 2015-06-22 DIAGNOSIS — I739 Peripheral vascular disease, unspecified: Secondary | ICD-10-CM | POA: Diagnosis present

## 2015-06-22 DIAGNOSIS — Z8711 Personal history of peptic ulcer disease: Secondary | ICD-10-CM | POA: Diagnosis not present

## 2015-06-22 DIAGNOSIS — I10 Essential (primary) hypertension: Secondary | ICD-10-CM | POA: Diagnosis not present

## 2015-06-22 DIAGNOSIS — M359 Systemic involvement of connective tissue, unspecified: Secondary | ICD-10-CM | POA: Diagnosis present

## 2015-06-22 DIAGNOSIS — I6522 Occlusion and stenosis of left carotid artery: Principal | ICD-10-CM | POA: Diagnosis present

## 2015-06-22 DIAGNOSIS — I509 Heart failure, unspecified: Secondary | ICD-10-CM | POA: Diagnosis not present

## 2015-06-22 HISTORY — PX: ENDARTERECTOMY: SHX5162

## 2015-06-22 LAB — GLUCOSE, CAPILLARY: GLUCOSE-CAPILLARY: 80 mg/dL (ref 65–99)

## 2015-06-22 SURGERY — ENDARTERECTOMY, CAROTID
Anesthesia: General | Laterality: Left | Wound class: Clean

## 2015-06-22 MED ORDER — LABETALOL HCL 5 MG/ML IV SOLN: 10.0000 mg | INTRAVENOUS | Status: DC | PRN

## 2015-06-22 MED ORDER — PHENOL 1.4 % MT LIQD
1.0000 | OROMUCOSAL | Status: DC | PRN
  Filled 2015-06-22: qty 177

## 2015-06-22 MED ORDER — HYDRALAZINE HCL 20 MG/ML IJ SOLN: 5.0000 mg | INTRAMUSCULAR | Status: DC | PRN

## 2015-06-22 MED ORDER — ROSUVASTATIN CALCIUM 5 MG PO TABS
5.0000 mg | ORAL_TABLET | Freq: Every day | ORAL | Status: DC
  Administered 2015-06-22: 5 mg via ORAL
  Filled 2015-06-22: qty 1

## 2015-06-22 MED ORDER — CEFUROXIME SODIUM 1.5 G IJ SOLR
1.5000 g | Freq: Two times a day (BID) | INTRAMUSCULAR | Status: AC
  Administered 2015-06-22: 1.5 g via INTRAVENOUS
  Filled 2015-06-22 (×2): qty 1.5

## 2015-06-22 MED ORDER — ACETAMINOPHEN 10 MG/ML IV SOLN
INTRAVENOUS | Status: DC | PRN
  Administered 2015-06-22: 1000 mg via INTRAVENOUS

## 2015-06-22 MED ORDER — ASPIRIN EC 81 MG PO TBEC
81.0000 mg | DELAYED_RELEASE_TABLET | Freq: Every day | ORAL | Status: DC
  Administered 2015-06-22 – 2015-06-23 (×2): 81 mg via ORAL
  Filled 2015-06-22 (×2): qty 1

## 2015-06-22 MED ORDER — PROPOFOL 10 MG/ML IV BOLUS
INTRAVENOUS | Status: DC | PRN
  Administered 2015-06-22: 120 mg via INTRAVENOUS

## 2015-06-22 MED ORDER — METOPROLOL TARTRATE 1 MG/ML IV SOLN
2.0000 mg | INTRAVENOUS | Status: DC | PRN
Start: 2015-06-22 — End: 2015-06-23

## 2015-06-22 MED ORDER — NITROGLYCERIN IN D5W 200-5 MCG/ML-% IV SOLN: 5.0000 ug/min | INTRAVENOUS | Status: DC

## 2015-06-22 MED ORDER — FUROSEMIDE 20 MG PO TABS: 20.0000 mg | ORAL_TABLET | Freq: Every day | ORAL | Status: DC

## 2015-06-22 MED ORDER — CEFAZOLIN SODIUM 1 G IJ SOLR
INTRAMUSCULAR | Status: AC
  Filled 2015-06-22: qty 10

## 2015-06-22 MED ORDER — ALBUTEROL SULFATE (2.5 MG/3ML) 0.083% IN NEBU: 2.5000 mg | INHALATION_SOLUTION | Freq: Four times a day (QID) | RESPIRATORY_TRACT | Status: DC | PRN

## 2015-06-22 MED ORDER — POTASSIUM CHLORIDE CRYS ER 20 MEQ PO TBCR: 20.0000 meq | EXTENDED_RELEASE_TABLET | Freq: Every day | ORAL | Status: DC | PRN

## 2015-06-22 MED ORDER — ALBUTEROL SULFATE HFA 108 (90 BASE) MCG/ACT IN AERS: 2.0000 | INHALATION_SPRAY | Freq: Four times a day (QID) | RESPIRATORY_TRACT | Status: DC | PRN

## 2015-06-22 MED ORDER — CLOPIDOGREL BISULFATE 75 MG PO TABS
75.0000 mg | ORAL_TABLET | Freq: Every day | ORAL | Status: DC
  Administered 2015-06-23: 75 mg via ORAL
  Filled 2015-06-22: qty 1

## 2015-06-22 MED ORDER — CEFAZOLIN SODIUM-DEXTROSE 2-3 GM-% IV SOLR
2.0000 g | INTRAVENOUS | Status: AC
  Administered 2015-06-22: 2 g via INTRAVENOUS

## 2015-06-22 MED ORDER — HYDROCODONE-ACETAMINOPHEN 10-325 MG PO TABS
1.0000 | ORAL_TABLET | Freq: Three times a day (TID) | ORAL | Status: DC | PRN
Start: 2015-06-22 — End: 2015-06-23
  Administered 2015-06-22 – 2015-06-23 (×3): 1 via ORAL
  Filled 2015-06-22 (×3): qty 1

## 2015-06-22 MED ORDER — GABAPENTIN 300 MG PO CAPS
300.0000 mg | ORAL_CAPSULE | Freq: Three times a day (TID) | ORAL | Status: DC
  Administered 2015-06-22 – 2015-06-23 (×3): 300 mg via ORAL
  Filled 2015-06-22 (×3): qty 1

## 2015-06-22 MED ORDER — DOCUSATE SODIUM 100 MG PO CAPS
100.0000 mg | ORAL_CAPSULE | Freq: Every day | ORAL | Status: DC
  Administered 2015-06-23: 100 mg via ORAL
  Filled 2015-06-22: qty 1

## 2015-06-22 MED ORDER — GUAIFENESIN-DM 100-10 MG/5ML PO SYRP: 15.0000 mL | ORAL_SOLUTION | ORAL | Status: DC | PRN

## 2015-06-22 MED ORDER — CARVEDILOL 6.25 MG PO TABS
6.2500 mg | ORAL_TABLET | Freq: Every day | ORAL | Status: DC
  Administered 2015-06-23: 6.25 mg via ORAL
  Filled 2015-06-22: qty 1

## 2015-06-22 MED ORDER — ROCURONIUM BROMIDE 100 MG/10ML IV SOLN
INTRAVENOUS | Status: DC | PRN
  Administered 2015-06-22: 40 mg via INTRAVENOUS

## 2015-06-22 MED ORDER — ACETAMINOPHEN 325 MG PO TABS: 325.0000 mg | ORAL_TABLET | ORAL | Status: DC | PRN

## 2015-06-22 MED ORDER — LIDOCAINE HCL (CARDIAC) 20 MG/ML IV SOLN
INTRAVENOUS | Status: DC | PRN
  Administered 2015-06-22: 50 mg via INTRAVENOUS
  Administered 2015-06-22: 30 mg via INTRAVENOUS

## 2015-06-22 MED ORDER — MORPHINE SULFATE (PF) 2 MG/ML IV SOLN
2.0000 mg | INTRAVENOUS | Status: DC | PRN
  Administered 2015-06-22: 2 mg via INTRAVENOUS
  Administered 2015-06-22: 4 mg via INTRAVENOUS
  Administered 2015-06-23 (×2): 2 mg via INTRAVENOUS
  Filled 2015-06-22 (×2): qty 1
  Filled 2015-06-22: qty 2
  Filled 2015-06-22: qty 1

## 2015-06-22 MED ORDER — MIDAZOLAM HCL 2 MG/2ML IJ SOLN
INTRAMUSCULAR | Status: DC | PRN
  Administered 2015-06-22: 1 mg via INTRAVENOUS

## 2015-06-22 MED ORDER — ONDANSETRON HCL 4 MG/2ML IJ SOLN
INTRAMUSCULAR | Status: DC | PRN
  Administered 2015-06-22: 4 mg via INTRAVENOUS

## 2015-06-22 MED ORDER — HEPARIN SODIUM (PORCINE) 1000 UNIT/ML IJ SOLN
INTRAMUSCULAR | Status: DC | PRN
  Administered 2015-06-22: 5000 [IU] via INTRAVENOUS

## 2015-06-22 MED ORDER — SODIUM CHLORIDE 0.9 % IV SOLN: 500.0000 mL | Freq: Once | INTRAVENOUS | Status: DC | PRN

## 2015-06-22 MED ORDER — SODIUM CHLORIDE 0.9 % IV SOLN
INTRAVENOUS | Status: DC | PRN
  Administered 2015-06-22: 5 mL

## 2015-06-22 MED ORDER — ALBUTEROL SULFATE (2.5 MG/3ML) 0.083% IN NEBU: 2.5000 mg | INHALATION_SOLUTION | Freq: Once | RESPIRATORY_TRACT | Status: DC

## 2015-06-22 MED ORDER — HEPARIN SODIUM (PORCINE) 10000 UNIT/ML IJ SOLN
INTRAMUSCULAR | Status: AC
  Filled 2015-06-22: qty 1

## 2015-06-22 MED ORDER — ONDANSETRON HCL 4 MG/2ML IJ SOLN: 4.0000 mg | Freq: Once | INTRAMUSCULAR | Status: DC | PRN

## 2015-06-22 MED ORDER — ACETAMINOPHEN 650 MG RE SUPP: 325.0000 mg | RECTAL | Status: DC | PRN

## 2015-06-22 MED ORDER — LIDOCAINE HCL (PF) 4 % IJ SOLN
INTRAMUSCULAR | Status: DC | PRN
  Administered 2015-06-22: 4 mL via RESPIRATORY_TRACT

## 2015-06-22 MED ORDER — LACTATED RINGERS IV SOLN
INTRAVENOUS | Status: DC
  Administered 2015-06-22 (×2): via INTRAVENOUS

## 2015-06-22 MED ORDER — PHENYLEPHRINE HCL 10 MG/ML IJ SOLN
INTRAMUSCULAR | Status: DC | PRN
  Administered 2015-06-22: 100 ug via INTRAVENOUS

## 2015-06-22 MED ORDER — FENTANYL CITRATE (PF) 100 MCG/2ML IJ SOLN
INTRAMUSCULAR | Status: AC
  Administered 2015-06-22: 25 ug via INTRAVENOUS
  Filled 2015-06-22: qty 2

## 2015-06-22 MED ORDER — PANTOPRAZOLE SODIUM 40 MG PO TBEC
40.0000 mg | DELAYED_RELEASE_TABLET | Freq: Every day | ORAL | Status: DC
  Administered 2015-06-23: 40 mg via ORAL
  Filled 2015-06-22: qty 1

## 2015-06-22 MED ORDER — CEFAZOLIN SODIUM-DEXTROSE 2-3 GM-% IV SOLR
INTRAVENOUS | Status: AC
  Filled 2015-06-22: qty 50

## 2015-06-22 MED ORDER — MEPERIDINE HCL 25 MG/ML IJ SOLN
INTRAMUSCULAR | Status: AC
  Administered 2015-06-22: 25 mg via INTRAVENOUS
  Filled 2015-06-22: qty 1

## 2015-06-22 MED ORDER — HEPARIN SODIUM (PORCINE) 1000 UNIT/ML IJ SOLN
INTRAMUSCULAR | Status: DC | PRN
Start: 2015-06-22 — End: 2015-06-22
  Administered 2015-06-22: 50 mL via INTRAMUSCULAR

## 2015-06-22 MED ORDER — PHENYLEPHRINE HCL 10 MG/ML IJ SOLN
10.0000 mg | INTRAVENOUS | Status: DC | PRN
  Administered 2015-06-22: 25 ug/min via INTRAVENOUS

## 2015-06-22 MED ORDER — ALUM & MAG HYDROXIDE-SIMETH 200-200-20 MG/5ML PO SUSP: 15.0000 mL | ORAL | Status: DC | PRN

## 2015-06-22 MED ORDER — LEVOTHYROXINE SODIUM 88 MCG PO TABS
88.0000 ug | ORAL_TABLET | Freq: Every day | ORAL | Status: DC
  Administered 2015-06-23: 88 ug via ORAL
  Filled 2015-06-22: qty 1

## 2015-06-22 MED ORDER — FENTANYL CITRATE (PF) 100 MCG/2ML IJ SOLN
25.0000 ug | INTRAMUSCULAR | Status: AC | PRN
  Administered 2015-06-22 (×6): 25 ug via INTRAVENOUS

## 2015-06-22 MED ORDER — MEPERIDINE HCL 25 MG/ML IJ SOLN
25.0000 mg | Freq: Once | INTRAMUSCULAR | Status: AC
  Administered 2015-06-22: 25 mg via INTRAVENOUS

## 2015-06-22 MED ORDER — ACETAMINOPHEN 10 MG/ML IV SOLN
INTRAVENOUS | Status: AC
  Filled 2015-06-22: qty 100

## 2015-06-22 MED ORDER — ONDANSETRON HCL 4 MG/2ML IJ SOLN: 4.0000 mg | Freq: Four times a day (QID) | INTRAMUSCULAR | Status: DC | PRN

## 2015-06-22 MED ORDER — FENTANYL CITRATE (PF) 100 MCG/2ML IJ SOLN
INTRAMUSCULAR | Status: DC | PRN
  Administered 2015-06-22 (×3): 50 ug via INTRAVENOUS
  Administered 2015-06-22: 100 ug via INTRAVENOUS

## 2015-06-22 MED ORDER — MAGNESIUM SULFATE 2 GM/50ML IV SOLN
2.0000 g | Freq: Every day | INTRAVENOUS | Status: DC | PRN
  Filled 2015-06-22: qty 50

## 2015-06-22 MED ORDER — LIDOCAINE HCL 1 % IJ SOLN
INTRAMUSCULAR | Status: DC | PRN
  Administered 2015-06-22: 10 mL

## 2015-06-22 MED ORDER — SODIUM CHLORIDE 0.9 % IV SOLN
INTRAVENOUS | Status: DC
  Administered 2015-06-22: 17:00:00 via INTRAVENOUS

## 2015-06-22 MED ORDER — LIDOCAINE HCL (PF) 1 % IJ SOLN
INTRAMUSCULAR | Status: AC
  Filled 2015-06-22: qty 30

## 2015-06-22 MED ORDER — OMEPRAZOLE MAGNESIUM 20 MG PO TBEC: 20.0000 mg | DELAYED_RELEASE_TABLET | ORAL | Status: DC

## 2015-06-22 SURGICAL SUPPLY — 58 items
BAG DECANTER FOR FLEXI CONT (MISCELLANEOUS) ×2 IMPLANT
BLADE SURG 15 STRL LF DISP TIS (BLADE) ×1 IMPLANT
BLADE SURG 15 STRL SS (BLADE) ×1
BLADE SURG SZ11 CARB STEEL (BLADE) ×2 IMPLANT
BOOT SUTURE AID YELLOW STND (SUTURE) ×2 IMPLANT
BRUSH SCRUB 4% CHG (MISCELLANEOUS) ×2 IMPLANT
CANISTER SUCT 1200ML W/VALVE (MISCELLANEOUS) ×2 IMPLANT
CATH TRAY 16F METER LATEX (MISCELLANEOUS) ×2 IMPLANT
DRAPE INCISE IOBAN 66X45 STRL (DRAPES) ×2 IMPLANT
DRAPE LAPAROTOMY 77X122 PED (DRAPES) ×2 IMPLANT
DRAPE SHEET LG 3/4 BI-LAMINATE (DRAPES) ×2 IMPLANT
DRSG TEGADERM 4X4.75 (GAUZE/BANDAGES/DRESSINGS) IMPLANT
DRSG TELFA 3X8 NADH (GAUZE/BANDAGES/DRESSINGS) IMPLANT
DURAPREP 26ML APPLICATOR (WOUND CARE) ×2 IMPLANT
ELECT CAUTERY BLADE 6.4 (BLADE) ×2 IMPLANT
ELECT REM PT RETURN 9FT ADLT (ELECTROSURGICAL) ×2
ELECTRODE REM PT RTRN 9FT ADLT (ELECTROSURGICAL) ×1 IMPLANT
EVICEL 2ML SEALANT HUMAN (Miscellaneous) ×2 IMPLANT
GLOVE BIO SURGEON STRL SZ7 (GLOVE) ×4 IMPLANT
GOWN STRL REUS W/ TWL LRG LVL3 (GOWN DISPOSABLE) ×2 IMPLANT
GOWN STRL REUS W/ TWL XL LVL3 (GOWN DISPOSABLE) ×2 IMPLANT
GOWN STRL REUS W/TWL LRG LVL3 (GOWN DISPOSABLE) ×2
GOWN STRL REUS W/TWL XL LVL3 (GOWN DISPOSABLE) ×2
HEMOSTAT SURGICEL 2X3 (HEMOSTASIS) ×2 IMPLANT
IV NS 250ML (IV SOLUTION) ×1
IV NS 250ML BAXH (IV SOLUTION) ×1 IMPLANT
KIT RM TURNOVER STRD PROC AR (KITS) ×2 IMPLANT
LABEL OR SOLS (LABEL) ×2 IMPLANT
LIQUID BAND (GAUZE/BANDAGES/DRESSINGS) ×2 IMPLANT
LOOP RED MAXI  1X406MM (MISCELLANEOUS) ×2
LOOP VESSEL MAXI 1X406 RED (MISCELLANEOUS) ×2 IMPLANT
LOOP VESSEL MINI 0.8X406 BLUE (MISCELLANEOUS) ×1 IMPLANT
LOOPS BLUE MINI 0.8X406MM (MISCELLANEOUS) ×1
NEEDLE FILTER BLUNT 18X 1/2SAF (NEEDLE) ×1
NEEDLE FILTER BLUNT 18X1 1/2 (NEEDLE) ×1 IMPLANT
NEEDLE HYPO 25X1 1.5 SAFETY (NEEDLE) ×2 IMPLANT
NS IRRIG 1000ML POUR BTL (IV SOLUTION) ×2 IMPLANT
PACK BASIN MAJOR ARMC (MISCELLANEOUS) ×2 IMPLANT
PATCH CAROTID ECM VASC 1X10 (Prosthesis & Implant Heart) ×2 IMPLANT
PENCIL ELECTRO HAND CTR (MISCELLANEOUS) IMPLANT
SHUNT W TPORT 9FR PRUITT F3 (SHUNT) ×2 IMPLANT
SUT MNCRL 4-0 (SUTURE) ×1
SUT MNCRL 4-0 27XMFL (SUTURE) ×1
SUT PROLENE 6 0 BV (SUTURE) ×14 IMPLANT
SUT PROLENE 7 0 BV 1 (SUTURE) ×4 IMPLANT
SUT SILK 2 0 (SUTURE) ×2
SUT SILK 2-0 18XBRD TIE 12 (SUTURE) ×2 IMPLANT
SUT SILK 3 0 (SUTURE) ×1
SUT SILK 3-0 18XBRD TIE 12 (SUTURE) ×1 IMPLANT
SUT SILK 4 0 (SUTURE) ×1
SUT SILK 4-0 18XBRD TIE 12 (SUTURE) ×1 IMPLANT
SUT VIC AB 3-0 SH 27 (SUTURE) ×2
SUT VIC AB 3-0 SH 27X BRD (SUTURE) ×2 IMPLANT
SUTURE MNCRL 4-0 27XMF (SUTURE) ×1 IMPLANT
SYR 20CC LL (SYRINGE) ×2 IMPLANT
SYRINGE 10CC LL (SYRINGE) ×4 IMPLANT
TOWEL OR 17X26 4PK STRL BLUE (TOWEL DISPOSABLE) ×2 IMPLANT
TUBING CONNECTING 10 (TUBING) IMPLANT

## 2015-06-22 NOTE — Op Note (Signed)
[facility]   OPERATIVE NOTE  PROCEDURE:   1.  Left carotid endarterectomy with CorMatrix arterial patch reconstruction  PRE-OPERATIVE DIAGNOSIS: 1.  High grade left carotid stenosis 2.COPD 3. Heart disease  POST-OPERATIVE DIAGNOSIS: same as above   SURGEON: Leotis Pain, MD  ASSISTANT(S): Hezzie Bump, PA-C  ANESTHESIA: general  ESTIMATED BLOOD LOSS: 100 cc  FINDING(S): 1.  left carotid plaque. 2.  Soft but enlarged lymph node  SPECIMEN(S):  Carotid plaque (sent to Pathology) Left cervical lymph node  INDICATIONS:   [name] is a 64 y.o. female who presents with left carotid stenosis of 75-80%.  I discussed with the patient the risks, benefits, and alternatives to carotid endarterectomy.  I discussed the differences between carotid stenting and carotid endarterectomy. I discussed the procedural details of carotid endarterectomy with the patient.  The patient is aware that the risks of carotid endarterectomy include but are not limited to: bleeding, infection, stroke, myocardial infarction, death, cranial nerve injuries both temporary and permanent, neck hematoma, possible airway compromise, labile blood pressure post-operatively, cerebral hyperperfusion syndrome, and possible need for additional interventions in the future. The patient is aware of the risks and agrees to proceed forward with the procedure.  DESCRIPTION: After full informed written consent was obtained from the patient, the patient was brought back to the operating room and placed supine upon the operating table.  Prior to induction, the patient received IV antibiotics.  After obtaining adequate anesthesia, the patient was placed into a modified beach chair position with a shoulder roll in place and the patient's neck slightly hyperextended and rotated away from the surgical site.  The patient was prepped in the standard fashion for a carotid endarterectomy.  I made an incision anterior  to the sternocleidomastoid muscle and dissected down through the subcutaneous tissue.  The platysmas was opened with electrocautery.  Then I dissected down to the internal jugular vein and facial vein.  The facial vein is ligated and divided between 2-0 silk ties.  This was dissected posteriorly until I obtained visualization of the common carotid artery. There was a lymph node that was soft, but markedly enlarged.  This was dissected out and sent to pathology. I then turned my attention to the common carotid artery.  The orientation of the bifurcation was a little atypical with the external carotid artery lateral and anterior. This was dissected out and then a vessel loop was placed around the common carotid artery.  I then dissected in a periadventitial fashion along the common carotid artery up to the bifurcation.  I then identified the external carotid artery and the superior thyroid artery.  Due to the unusual configuration of the superior thyroid and external carotid artery I ligated the superior thyroid artery with 2-0 silk ties. I dissected out the external carotid artery and placed a vessel loop around it. In the process of this dissection, the hypoglossal nerve was identified and protected from harm.  I then dissected out the internal carotid artery until I identified an area in the internal carotid artery clearly above the stenosis.  I dissected slightly distal to this area, and placed a vessel loop around the artery.  At this point, we gave the patient 5000 units of intravenous heparin.  After this was allowed to circulate for several minutes, I pulled up control on the vessel loops to clamp the internal carotid artery, external carotid artery, and then the common carotid artery.  I then made an arteriotomy in the common  carotid artery with a 11 blade, and extended the arteriotomy with a Potts scissor down into the common carotid artery, then I carried the arteriotomy through the bifurcation into the  internal carotid artery until I reached an area that was not diseased.  At this point, I took the [place] shunt that previously been prepared and I inserted it into the internal carotid artery first, and then into the common carotid artery taking care to flush and de-air prior to release of control. At this point, I started the endarterectomy in the common carotid artery with a [place] elevator and carried this dissection down into the common carotid artery circumferentially.  Then I transected the plaque at a segment where it was adherent and transected the plaque with Potts scissors.  I then carried this dissection up into the external carotid artery.  The plaque was extracted by unclamping the external carotid artery and performing an eversion endarterectomy.  The dissection was then carried into the internal carotid artery where a nice feathered end point was created with gentle traction.  I passed the plaque off the field as a specimen. At this point I removed all loose flecks and remaining disease possible.  At this point, I was satisfied that the minimal remaining disease was densely adherent to the wall and wall integrity was intact. The distal endpoint was tacked down with two 7-0 Prolene sutures.  I then fashioned a CorMatrix arterial patch for the artery and sewed it in place with two running stitch of 6-0 Prolene.  I started at the distal endpoint and ran one half the length of the arteriotomy.  I then cut and beveled the patch to an appropriate length to match the arteriotomy.  I started the second 6-0 Prolene at the proximal end point.  The medial suture line was completed and the lateral suture line was run approximately one quarter the length of the arteriotomy.  Prior to completing this patch angioplasty, I removed the shunt first from the internal carotid artery, from which there was excellent backbleeding, and clamped it.  Then I removed the shunt from the common carotid artery, from which  there was excellent antegrade bleeding, and then clamped it.  At this point, I allowed the external carotid artery to backbleed, which was excellent.  Then I instilled heparinized saline in this patched artery and then completed the patch angioplasty in the usual fashion.  First, I released the clamp on the external carotid artery, then I released it on the common carotid artery.  After waiting a few seconds, I then released it on the internal carotid artery. Several minutes of pressure were held and 6-0 Prolene patch sutures were used as need for hemostasis.  At this point, I placed Surgicel and Evicel topical hemostatic agents.  There was no more active bleeding in the surgical site.  The sternocleidomastoid space was closed with three interrupted 3-0 Vicryl sutures. I then reapproximated the platysma muscle with a running stitch of 3-0 Vicryl.  The skin was then closed with a running subcuticular 4-0 Monocryl.  The skin was then cleaned, dried and Dermabond was used to reinforce the skin closure.  The patient awakened and was taken to the recovery room in stable condition, following commands and moving all four extremities without any apparent deficits.    COMPLICATIONS: none  CONDITION: stable  Qusay Villada  06/22/2015, 2:57 PM

## 2015-06-22 NOTE — H&P (Signed)
  [facility] History & Physical Update  The patient was interviewed and re-examined.  The patient's previous History and Physical has been reviewed and is unchanged.  There is no change in the plan of care. We plan to proceed with the scheduled procedure.  Jamera Vanloan, MD  06/22/2015, 12:41 PM

## 2015-06-22 NOTE — Progress Notes (Signed)
The patient did not keep her hematology referral today from Dr. Wynetta Emery. I have entered a msg for cancer center scheduling to r/s this apt.

## 2015-06-22 NOTE — Progress Notes (Signed)
eLink Physician-Brief Progress Note Patient Name: [name] DOB: 06/05/1950 MRN: [id number]   Date of Service  06/22/2015  HPI/Events of Note  S/p CEA Stable on camera check  eICU Interventions  No eICU intervention     Intervention Category Evaluation Type: New Patient Evaluation  Simonne Maffucci 06/22/2015, 6:06 PM
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
## 2015-06-22 NOTE — Anesthesia Procedure Notes (Signed)
Procedure Name: Intubation Date/Time: 06/22/2015 1:06 PM Performed by: Rosaria Ferries, Kursten Kruk Pre-anesthesia Checklist: Patient identified, Emergency Drugs available, Suction available and Patient being monitored Patient Re-evaluated:Patient Re-evaluated prior to inductionOxygen Delivery Method: Circle system utilized Preoxygenation: Pre-oxygenation with 100% oxygen Intubation Type: IV induction Laryngoscope Size: Mac and 3 Grade View: Grade I Tube type: Oral Tube size: 7.0 mm Number of attempts: 1 Placement Confirmation: ETT inserted through vocal cords under direct vision,  positive ETCO2 and breath sounds checked- equal and bilateral Secured at: 21 cm Tube secured with: Tape Dental Injury: Teeth and Oropharynx as per pre-operative assessment

## 2015-06-22 NOTE — Anesthesia Preprocedure Evaluation (Addendum)
Anesthesia Evaluation  Patient identified by MRN, date of birth, ID band Patient awake    Reviewed: Allergy & Precautions, NPO status , Patient's Chart, lab work & pertinent test results, reviewed documented beta blocker date and time   Airway Mallampati: II       Dental  (+) Upper Dentures   Pulmonary pneumonia, resolved, Current Smoker,    Pulmonary exam normal breath sounds clear to auscultation       Cardiovascular hypertension, Pt. on medications and Pt. on home beta blockers + CAD, + Past MI, + Peripheral Vascular Disease and +CHF  Normal cardiovascular exam     Neuro/Psych Anxiety Depression    GI/Hepatic PUD, GERD  Medicated and Controlled,  Endo/Other  Hypothyroidism   Renal/GU ARF in the past     Musculoskeletal  (+) Arthritis , Osteoarthritis,    Abdominal Normal abdominal exam  (+)   Peds  Hematology  (+) anemia ,   Anesthesia Other Findings   Reproductive/Obstetrics                            Anesthesia Physical Anesthesia Plan  ASA: III  Anesthesia Plan: General   Post-op Pain Management:    Induction: Intravenous  Airway Management Planned: Oral ETT  Additional Equipment: Arterial line  Intra-op Plan:   Post-operative Plan: Extubation in OR  Informed Consent: I have reviewed the patients History and Physical, chart, labs and discussed the procedure including the risks, benefits and alternatives for the proposed anesthesia with the patient or authorized representative who has indicated his/her understanding and acceptance.   Dental advisory given  Plan Discussed with: CRNA and Surgeon  Anesthesia Plan Comments:         Anesthesia Quick Evaluation

## 2015-06-22 NOTE — Transfer of Care (Signed)
Immediate Anesthesia Transfer of Care Note  Patient: [name]  Procedure(s) Performed: Procedure(s): ENDARTERECTOMY CAROTID (Left)  Patient Location: PACU  Anesthesia Type:General  Level of Consciousness: awake, alert , oriented and patient cooperative  Airway & Oxygen Therapy: Patient Spontanous Breathing and Patient connected to nasal cannula oxygen  Post-op Assessment: Report given to RN and Post -op Vital signs reviewed and stable  Post vital signs: Reviewed and stable  Last Vitals:  Filed Vitals:   06/22/15 1037  BP: 143/65  Pulse: 87  Temp: 37.6 C  Resp: 18    Complications: No apparent anesthesia complications

## 2015-06-23 LAB — CBC
HCT: 25.2 % — ABNORMAL LOW (ref 35.0–47.0)
Hemoglobin: 7.7 g/dL — ABNORMAL LOW (ref 12.0–16.0)
MCH: 23.9 pg — ABNORMAL LOW (ref 26.0–34.0)
MCHC: 30.6 g/dL — AB (ref 32.0–36.0)
MCV: 78.2 fL — AB (ref 80.0–100.0)
PLATELETS: 434 10*3/uL (ref 150–440)
RBC: 3.22 MIL/uL — ABNORMAL LOW (ref 3.80–5.20)
RDW: 19 % — AB (ref 11.5–14.5)
WBC: 6.7 10*3/uL (ref 3.6–11.0)

## 2015-06-23 LAB — BASIC METABOLIC PANEL
Anion gap: 4 — ABNORMAL LOW (ref 5–15)
BUN: 10 mg/dL (ref 6–20)
CHLORIDE: 110 mmol/L (ref 101–111)
CO2: 25 mmol/L (ref 22–32)
CREATININE: 0.74 mg/dL (ref 0.44–1.00)
Calcium: 8.1 mg/dL — ABNORMAL LOW (ref 8.9–10.3)
GFR calc Af Amer: >60 mL/min (ref >60)
GFR calc non Af Amer: >60 mL/min (ref >60)
GLUCOSE: 89 mg/dL (ref 65–99)
Potassium: 4.5 mmol/L (ref 3.5–5.1)
Sodium: 139 mmol/L (ref 135–145)

## 2015-06-23 MED ORDER — HYDROCODONE-ACETAMINOPHEN 10-325 MG PO TABS: 1.0000 | ORAL_TABLET | Freq: Three times a day (TID) | ORAL | Status: DC | PRN

## 2015-06-23 MED ORDER — ESMOLOL HCL-SODIUM CHLORIDE 2000 MG/100ML IV SOLN
25.0000 ug/kg/min | INTRAVENOUS | Status: DC
  Filled 2015-06-23: qty 100

## 2015-06-23 MED ORDER — DOPAMINE-DEXTROSE 3.2-5 MG/ML-% IV SOLN: 3.0000 ug/kg/min | INTRAVENOUS | Status: DC

## 2015-06-23 MED ORDER — FUROSEMIDE 10 MG/ML IJ SOLN
20.0000 mg | Freq: Once | INTRAMUSCULAR | Status: AC
  Administered 2015-06-23: 20 mg via INTRAVENOUS
  Filled 2015-06-23: qty 2

## 2015-06-23 NOTE — Progress Notes (Signed)
ANTIBIOTIC CONSULT NOTE - INITIAL  Pharmacy Consult for Antibiotic Renal Dose Adjustment Indication: Perioperative Abx  Allergies  Allergen Reactions  . Codeine     Hives     Patient Measurements: Height: 5\' 5"  (165.1 cm) Weight: 135 lb (61.236 kg) IBW/kg (Calculated) : 57  Vital Signs: Temp: 98.5 F (36.9 C) (02/17 0400) Temp Source: Axillary (02/17 0400) BP: 132/45 mmHg (02/17 0700) Pulse Rate: 81 (02/17 0700) Intake/Output from previous day: 02/16 0701 - 02/17 0700 In: 2768.3 [I.V.:2718.3; IV Piggyback:50] Out: [id number] [Urine:1310; Blood:100] Intake/Output from this shift: Total I/O In: 150 [I.V.:150] Out: 1175 [Urine:1175]  Labs:  Recent Labs  06/23/15 0431  WBC 6.7  HGB 7.7*  PLT 434  CREATININE 0.74   Estimated Creatinine Clearance: 63.1 mL/min (by C-G formula based on Cr of 0.74). No results for input(s): VANCOTROUGH, VANCOPEAK, VANCORANDOM, GENTTROUGH, GENTPEAK, GENTRANDOM, TOBRATROUGH, TOBRAPEAK, TOBRARND, AMIKACINPEAK, AMIKACINTROU, AMIKACIN in the last 72 hours.   Microbiology: Recent Results (from the past 720 hour(s))  Surgical pcr screen     Status: None   Collection Time: 06/13/15 10:43 AM  Result Value Ref Range Status   MRSA, PCR NEGATIVE NEGATIVE Final   Staphylococcus aureus NEGATIVE NEGATIVE Final    Comment:        The Xpert SA Assay (FDA approved for NASAL specimens in patients over 85 years of age), is one component of a comprehensive surveillance program.  Test performance has been validated by [facility] for patients greater than or equal to 42 year old. It is not intended to diagnose infection nor to guide or monitor treatment.     Medical History: Past Medical History  Diagnosis Date  . NICM (nonischemic cardiomyopathy) ([place])     a. 2008 Echo: EF 20% [facility]);  b. 2011 Echo: EF 50-55% ([facility]);  c. 02/2014 Echo: EF 20-25%, mod dil LA/RA, severe MR, mod-sev TR. d. cath 03/15/2014: minor lumenal irregs EF 20%  . Chronic systolic  CHF (congestive heart failure) ([place])     a. 02/2014 Echo: EF 20-25%, severe MR, tricuspid regurg, mod dilated LA & RA  . HTN (hypertension)   . Hyperlipidemia   . Hypothyroidism     a. 02/2014 TSH 96.4.  . Spinal stenosis   . Chronic back pain   . PUD (peptic ulcer disease)   . History of nuclear stress test     a. 02/24/2014: Lexiscan Myoview: no sig ischemia, On attenuation corrected images small mild perfusion defect in the apical & distal anterior wall w/ possible small mild ischemia. Mod global HK. EF 35%. Overall, moderate risk study.  b. cath 03/17/2014 with no sig CAD  . Myocardial infarction [facility]) 2007; 2008; 03/2014  . PAD (peripheral artery disease) ([place]) 04/06/2014    Successful self-expanding stent placement to the left external iliac artery and right common iliac arteries, med rx for L SFA  dz.  . Pneumonia "several times"  . Anemia   . History of blood transfusion >50 times    "had blood transfusion; never found out what"  . Arthritis     right hip; back  . Chronic lower back pain   . On home oxygen therapy     prn  . Acute bronchitis   . Depression   . Collagen vascular disease ([place])   . Coronary artery disease   . GERD (gastroesophageal reflux disease)   . Chronic right hip pain     Medications:  Scheduled:  . albuterol  2.5 mg Nebulization Once  . aspirin  EC  81 mg Oral Daily  . carvedilol  6.25 mg Oral Daily  . clopidogrel  75 mg Oral Q breakfast  . docusate sodium  100 mg Oral Daily  . furosemide  20 mg Oral Daily  . gabapentin  300 mg Oral TID  . levothyroxine  88 mcg Oral QAC breakfast  . pantoprazole  40 mg Oral Daily  . rosuvastatin  5 mg Oral q1800   Infusions:  . nitroGLYCERIN     Assessment: 65 y/o F with perioperative abx ordered s/p CEA.   Plan:  Antibiotics do not require adjustment as renal function is appropriate. Will sign off.   Ulice Dash D 06/23/2015,12:07 PM

## 2015-06-23 NOTE — Anesthesia Postprocedure Evaluation (Signed)
Anesthesia Post Note  Patient: [name]  Procedure(s) Performed: Procedure(s) (LRB): ENDARTERECTOMY CAROTID (Left)  Patient location during evaluation: ICU Anesthesia Type: General Level of consciousness: awake and alert Pain management: pain level controlled Vital Signs Assessment: post-procedure vital signs reviewed and stable Respiratory status: spontaneous breathing, nonlabored ventilation, respiratory function stable and patient connected to nasal cannula oxygen Cardiovascular status: blood pressure returned to baseline and stable Postop Assessment: no signs of nausea or vomiting Anesthetic complications: no    Last Vitals:  Filed Vitals:   06/23/15 0500 06/23/15 0600  BP: 137/48 136/47  Pulse: 77 78  Temp:    Resp: 12 14    Last Pain:  Filed Vitals:   06/23/15 0625  PainSc: Asleep                 Alison Stalling

## 2015-06-23 NOTE — Care Management (Addendum)
Patient had elective left carotid endarterectomy [id number] without complications.  She is for discahrge home today. She says she is current with her pcp [facility]. Confirms her address and phone is connected and working.   She is noted to have room air sats in the 80's.  Patient does have diagnosis of chronic copd and chf.  She has chronic home 02 through [facility] and agency will bring her a portable tank to her room for transport home.  She is agreeable to home health nursing follow up.  Agency preference - [facility].  This agency is in network with patient's insurance.  Agency will make home visit 2/18

## 2015-06-23 NOTE — Progress Notes (Signed)
Patient hypoxic on room air Has COPD and CHF at baseline and has had oxygen at home before Needs home Oxygen and will discharge today as doing well otherwise

## 2015-06-23 NOTE — Progress Notes (Signed)
Discharge instruction and prescriptions given with verbalized understanding.  VSS.  Patient refused to take portable oxygen or wear oxygen home.  Patient stated "I only live a mile away and I don't thing I need oxygen".  Nurse attempted to educate and explain importance of wearing oxygen and why Dr. Lucky Cowboy has ordered oxygen.  Patient still refused to take or wear oxygen.  Patient taken to visitors entrance via wheelchair by CNA to be taken home in personal vehicle by family member.

## 2015-06-23 NOTE — Progress Notes (Signed)
Patient sounds coarse bilaterally.  Dr. Lucky Cowboy notified that patient has history of CHF and has been on IV fluids through out night.  Verbal orders received for IV lasix.

## 2015-06-23 NOTE — Care Management (Signed)
Patient was going to refused to use the portable home 02 tank.  She is going to the home of her sister: Eunie Yake at [place] [phone].  Phone number [phone].  Patient's sister to take the concentrator to her home.   Explained to patient and her sister the need for 02 and verbalized understanding and agreed to take the 02.  CM was later informed by primary nurse that patient refused. to take the 02.  Returned tank to [facility]

## 2015-06-23 NOTE — Progress Notes (Signed)
Dr. Lucky Cowboy called and notified that patient de-sats to mid 61's on room air.  Care management to be notified and have patient set up with room air as per Dr. Bunnie Domino request.

## 2015-06-23 NOTE — Discharge Summary (Signed)
[place]    Discharge Summary    Patient ID:  TIERNY LABRANCHE MRN: [id number] DOB/AGE: 10-05-1950 65 y.o.  Admit date: 06/22/2015 Discharge date: 06/23/2015 Date of Surgery: 06/22/2015 Surgeon: Surgeon(s): Algernon Huxley, MD  Admission Diagnosis: CAROTID ARTERY STENOSIS  Discharge Diagnoses:  CAROTID ARTERY STENOSIS  Secondary Diagnoses: Past Medical History  Diagnosis Date  . NICM (nonischemic cardiomyopathy) ([place])     a. 2008 Echo: EF 20% [facility]);  b. 2011 Echo: EF 50-55% ([facility]);  c. 02/2014 Echo: EF 20-25%, mod dil LA/RA, severe MR, mod-sev TR. d. cath 03/15/2014: minor lumenal irregs EF 20%  . Chronic systolic CHF (congestive heart failure) ([place])     a. 02/2014 Echo: EF 20-25%, severe MR, tricuspid regurg, mod dilated LA & RA  . HTN (hypertension)   . Hyperlipidemia   . Hypothyroidism     a. 02/2014 TSH 96.4.  . Spinal stenosis   . Chronic back pain   . PUD (peptic ulcer disease)   . History of nuclear stress test     a. 02/24/2014: Lexiscan Myoview: no sig ischemia, On attenuation corrected images small mild perfusion defect in the apical & distal anterior wall w/ possible small mild ischemia. Mod global HK. EF 35%. Overall, moderate risk study.  b. cath 03/17/2014 with no sig CAD  . Myocardial infarction [facility]) 2007; 2008; 03/2014  . PAD (peripheral artery disease) ([place]) 04/06/2014    Successful self-expanding stent placement to the left external iliac artery and right common iliac arteries, med rx for L SFA  dz.  . Pneumonia "several times"  . Anemia   . History of blood transfusion >50 times    "had blood transfusion; never found out what"  . Arthritis     right hip; back  . Chronic lower back pain   . On home oxygen therapy     prn  . Acute bronchitis   . Depression   . Collagen vascular disease ([place])   . Coronary artery disease   . GERD (gastroesophageal reflux disease)   . Chronic right hip pain      Procedure(s): ENDARTERECTOMY CAROTID  Discharged Condition: good  HPI:  Patient admitted with carotid stenosis for CEA  Hospital Course:  [name] is a 65 y.o. female is S/P left Procedure(s): ENDARTERECTOMY CAROTID Extubated: in OR Physical exam: neuro exam normal, AF/VSS Post-op wounds C/D/I Pt. Ambulating, voiding and taking PO diet without difficulty. Pt pain controlled with PO pain meds. Labs as below Complications:none  Consults:     Significant Diagnostic Studies: CBC Lab Results  Component Value Date   WBC 6.7 06/23/2015   HGB 7.7* 06/23/2015   HCT 25.2* 06/23/2015   MCV 78.2* 06/23/2015   PLT 434 06/23/2015    BMET    Component Value Date/Time   NA 139 06/23/2015 0431   NA 143 06/08/2015 1609   NA 133* 04/02/2014 0420   K 4.5 06/23/2015 0431   K 3.6 04/02/2014 0420   CL 110 06/23/2015 0431   CL 93* 04/02/2014 0420   CO2 25 06/23/2015 0431   CO2 36* 04/02/2014 0420   GLUCOSE 89 06/23/2015 0431   GLUCOSE 102* 06/08/2015 1609   GLUCOSE 92 04/02/2014 0420   BUN 10 06/23/2015 0431   BUN 13 06/08/2015 1609   BUN 10 04/02/2014 0420   CREATININE 0.74 06/23/2015 0431   CREATININE 0.77 04/02/2014 0420   CALCIUM 8.1* 06/23/2015 0431   CALCIUM 8.8 04/02/2014 0420   GFRNONAA >  60 06/23/2015 0431   GFRNONAA >60 04/02/2014 0420   GFRNONAA 19* 11/12/2012 1915   GFRAA >60 06/23/2015 0431   GFRAA >60 04/02/2014 0420   GFRAA 22* 11/12/2012 1915   COAG Lab Results  Component Value Date   INR 0.93 06/13/2015   INR CANCELED 06/08/2015   INR 1.0 06/08/2015     Disposition:  Discharge to home     Medication List    TAKE these medications        albuterol 108 (90 Base) MCG/ACT inhaler  Commonly known as:  PROVENTIL HFA;VENTOLIN HFA  Inhale 2 puffs into the lungs every 6 (six) hours as needed for wheezing or shortness of breath.     aspirin EC 81 MG tablet  Take 81 mg by mouth daily.     carvedilol 6.25 MG tablet  Commonly known as:   COREG  Take 6.25 mg by mouth daily.     furosemide 20 MG tablet  Commonly known as:  LASIX  Take 20 mg by mouth as needed.     gabapentin 300 MG capsule  Commonly known as:  NEURONTIN  Take 300 mg by mouth 3 (three) times daily.     HYDROcodone-acetaminophen 10-325 MG tablet  Commonly known as:  NORCO  Take 1 tablet by mouth 3 (three) times daily as needed for moderate pain.     HYDROcodone-acetaminophen 10-325 MG tablet  Commonly known as:  NORCO  Take 1 tablet by mouth 3 (three) times daily as needed for moderate pain.     levothyroxine 88 MCG tablet  Commonly known as:  SYNTHROID, LEVOTHROID  Take 1 tablet (88 mcg total) by mouth daily before breakfast.     omeprazole 20 MG tablet  Commonly known as:  PRILOSEC OTC  Take 20 mg by mouth as needed.     rosuvastatin 5 MG tablet  Commonly known as:  CRESTOR  Take 1 tablet (5 mg total) by mouth daily.       Verbal and written Discharge instructions given to the patient. Wound care per Discharge AVS     Follow-up Information    Follow up with [facility] A STEGMAYER, PA-C In 3 weeks.   Specialty:  Physician Assistant   Why:  with carotid duplex   Contact information:   [place] [address] [phone]       Signed: Leotis Pain, MD  06/23/2015, 8:02 AM

## 2015-06-26 ENCOUNTER — Other Ambulatory Visit: Payer: Self-pay | Admitting: Family Medicine

## 2015-06-26 LAB — SURGICAL PATHOLOGY

## 2015-06-26 MED ORDER — HYDROCODONE-ACETAMINOPHEN 10-325 MG PO TABS: 1.0000 | ORAL_TABLET | Freq: Three times a day (TID) | ORAL | Status: DC | PRN

## 2015-06-28 DIAGNOSIS — Z48812 Encounter for surgical aftercare following surgery on the circulatory system: Secondary | ICD-10-CM | POA: Diagnosis not present

## 2015-06-28 DIAGNOSIS — J449 Chronic obstructive pulmonary disease, unspecified: Secondary | ICD-10-CM | POA: Diagnosis not present

## 2015-06-28 DIAGNOSIS — F329 Major depressive disorder, single episode, unspecified: Secondary | ICD-10-CM | POA: Diagnosis not present

## 2015-06-28 DIAGNOSIS — I70209 Unspecified atherosclerosis of native arteries of extremities, unspecified extremity: Secondary | ICD-10-CM | POA: Diagnosis not present

## 2015-06-28 DIAGNOSIS — I5022 Chronic systolic (congestive) heart failure: Secondary | ICD-10-CM | POA: Diagnosis not present

## 2015-06-28 DIAGNOSIS — D649 Anemia, unspecified: Secondary | ICD-10-CM | POA: Diagnosis not present

## 2015-06-28 DIAGNOSIS — I252 Old myocardial infarction: Secondary | ICD-10-CM | POA: Diagnosis not present

## 2015-06-28 DIAGNOSIS — M4800 Spinal stenosis, site unspecified: Secondary | ICD-10-CM | POA: Diagnosis not present

## 2015-06-28 DIAGNOSIS — I2510 Atherosclerotic heart disease of native coronary artery without angina pectoris: Secondary | ICD-10-CM | POA: Diagnosis not present

## 2015-06-28 DIAGNOSIS — I110 Hypertensive heart disease with heart failure: Secondary | ICD-10-CM | POA: Diagnosis not present

## 2015-06-29 ENCOUNTER — Telehealth: Payer: Self-pay | Admitting: Family Medicine

## 2015-06-29 NOTE — Telephone Encounter (Signed)
Home health called asking for a verbal for med management given her carotid endarterectomy. Given.

## 2015-07-03 ENCOUNTER — Telehealth: Payer: Self-pay | Admitting: Family Medicine

## 2015-07-03 MED ORDER — LEVOTHYROXINE SODIUM 88 MCG PO TABS: 88.0000 ug | ORAL_TABLET | Freq: Every day | ORAL | Status: DC

## 2015-07-03 NOTE — Telephone Encounter (Signed)
levothyroxine (SYNTHROID, LEVOTHROID) 88 MCG tablet  Patient is out of medication and needs refill sent to [facility] in [place], thanks.

## 2015-07-03 NOTE — Telephone Encounter (Signed)
Rx sent to her pharmacy 

## 2015-07-03 NOTE — Telephone Encounter (Signed)
Forward to provider

## 2015-07-04 DIAGNOSIS — E559 Vitamin D deficiency, unspecified: Secondary | ICD-10-CM | POA: Diagnosis not present

## 2015-07-04 DIAGNOSIS — I5021 Acute systolic (congestive) heart failure: Secondary | ICD-10-CM | POA: Diagnosis not present

## 2015-07-04 DIAGNOSIS — I129 Hypertensive chronic kidney disease with stage 1 through stage 4 chronic kidney disease, or unspecified chronic kidney disease: Secondary | ICD-10-CM | POA: Diagnosis not present

## 2015-07-04 DIAGNOSIS — D631 Anemia in chronic kidney disease: Secondary | ICD-10-CM | POA: Diagnosis not present

## 2015-07-04 DIAGNOSIS — N179 Acute kidney failure, unspecified: Secondary | ICD-10-CM | POA: Diagnosis not present

## 2015-07-04 DIAGNOSIS — N183 Chronic kidney disease, stage 3 (moderate): Secondary | ICD-10-CM | POA: Diagnosis not present

## 2015-07-10 ENCOUNTER — Encounter: Payer: Medicare Other | Admitting: Family Medicine

## 2015-07-11 ENCOUNTER — Encounter: Payer: Self-pay | Admitting: Family Medicine

## 2015-07-11 ENCOUNTER — Ambulatory Visit (INDEPENDENT_AMBULATORY_CARE_PROVIDER_SITE_OTHER): Payer: Medicare Other | Admitting: Family Medicine

## 2015-07-11 VITALS — BP 119/67 | HR 99 | Temp 100.0°F | Ht 62.1 in | Wt 136.0 lb

## 2015-07-11 DIAGNOSIS — E785 Hyperlipidemia, unspecified: Secondary | ICD-10-CM

## 2015-07-11 DIAGNOSIS — Z0000 Encounter for general adult medical examination without abnormal findings: Secondary | ICD-10-CM

## 2015-07-11 DIAGNOSIS — I428 Other cardiomyopathies: Secondary | ICD-10-CM

## 2015-07-11 DIAGNOSIS — Z720 Tobacco use: Secondary | ICD-10-CM

## 2015-07-11 DIAGNOSIS — I429 Cardiomyopathy, unspecified: Secondary | ICD-10-CM | POA: Diagnosis not present

## 2015-07-11 DIAGNOSIS — R8281 Pyuria: Secondary | ICD-10-CM

## 2015-07-11 DIAGNOSIS — N179 Acute kidney failure, unspecified: Secondary | ICD-10-CM

## 2015-07-11 DIAGNOSIS — M1611 Unilateral primary osteoarthritis, right hip: Secondary | ICD-10-CM | POA: Diagnosis not present

## 2015-07-11 DIAGNOSIS — E039 Hypothyroidism, unspecified: Secondary | ICD-10-CM

## 2015-07-11 DIAGNOSIS — N390 Urinary tract infection, site not specified: Secondary | ICD-10-CM | POA: Diagnosis not present

## 2015-07-11 DIAGNOSIS — I5022 Chronic systolic (congestive) heart failure: Secondary | ICD-10-CM

## 2015-07-11 DIAGNOSIS — Z1211 Encounter for screening for malignant neoplasm of colon: Secondary | ICD-10-CM | POA: Diagnosis not present

## 2015-07-11 DIAGNOSIS — D509 Iron deficiency anemia, unspecified: Secondary | ICD-10-CM

## 2015-07-11 DIAGNOSIS — I10 Essential (primary) hypertension: Secondary | ICD-10-CM | POA: Diagnosis not present
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
## 2015-07-11 NOTE — Assessment & Plan Note (Signed)
Stable on current medicine. Continue TID pain medicine. Will get her into orthopedics next visit when recovered from endarterectomy.

## 2015-07-11 NOTE — Patient Instructions (Addendum)
Preventative Services:  Health Risk Assessment and Personalized Prevention Plan: Bone Mass Measurements: ordered Breast Cancer Screening: up to date CVD Screening: done today Cervical Cancer Screening: N/A Colon Cancer Screening: Will do cologuard Depression Screening: up to date Diabetes Screening: done today Glaucoma Screening: advised to see her eye doctor Hepatitis B vaccine: refused Hepatitis C screening: up to date HIV Screening: up to date Flu Vaccine: refused Lung cancer Screening: Will do later this year Obesity Screening: done today Pneumonia Vaccines (2): Refused STI Screening: refused  Menopause is a normal process in which your reproductive ability comes to an end. This process happens gradually over a span of months to years, usually between the ages of 81 and 29. Menopause is complete when you have missed 12 consecutive menstrual periods. It is important to talk with your health care provider about some of the most common conditions that affect postmenopausal women, such as heart disease, cancer, and bone loss (osteoporosis). Adopting a healthy lifestyle and getting preventive care can help to promote your health and wellness. Those actions can also lower your chances of developing some of these common conditions. WHAT SHOULD I KNOW ABOUT MENOPAUSE? During menopause, you may experience a number of symptoms, such as:  Moderate-to-severe hot flashes.  Night sweats.  Decrease in sex drive.  Mood swings.  Headaches.  Tiredness.  Irritability.  Memory problems.  Insomnia. Choosing to treat or not to treat menopausal changes is an individual decision that you make with your health care provider. WHAT SHOULD I KNOW ABOUT HORMONE REPLACEMENT THERAPY AND SUPPLEMENTS? Hormone therapy products are effective for treating symptoms that are associated with menopause, such as hot flashes and night sweats. Hormone replacement carries certain risks, especially as you become  older. If you are thinking about using estrogen or estrogen with progestin treatments, discuss the benefits and risks with your health care provider. WHAT SHOULD I KNOW ABOUT HEART DISEASE AND STROKE? Heart disease, heart attack, and stroke become more likely as you age. This may be due, in part, to the hormonal changes that your body experiences during menopause. These can affect how your body processes dietary fats, triglycerides, and cholesterol. Heart attack and stroke are both medical emergencies. There are many things that you can do to help prevent heart disease and stroke:  Have your blood pressure checked at least every 1-2 years. High blood pressure causes heart disease and increases the risk of stroke.  If you are 24-56 years old, ask your health care provider if you should take aspirin to prevent a heart attack or a stroke.  Do not use any tobacco products, including cigarettes, chewing tobacco, or electronic cigarettes. If you need help quitting, ask your health care provider.  It is important to eat a healthy diet and maintain a healthy weight.  Be sure to include plenty of vegetables, fruits, low-fat dairy products, and lean protein.  Avoid eating foods that are high in solid fats, added sugars, or salt (sodium).  Get regular exercise. This is one of the most important things that you can do for your health.  Try to exercise for at least 150 minutes each week. The type of exercise that you do should increase your heart rate and make you sweat. This is known as moderate-intensity exercise.  Try to do strengthening exercises at least twice each week. Do these in addition to the moderate-intensity exercise.  Know your numbers.Ask your health care provider to check your cholesterol and your blood glucose. Continue to have your blood  tested as directed by your health care provider. WHAT SHOULD I KNOW ABOUT CANCER SCREENING? There are several types of cancer. Take the following  steps to reduce your risk and to catch any cancer development as early as possible. Breast Cancer  Practice breast self-awareness.  This means understanding how your breasts normally appear and feel.  It also means doing regular breast self-exams. Let your health care provider know about any changes, no matter how small.  If you are 43 or older, have a clinician do a breast exam (clinical breast exam or CBE) every year. Depending on your age, family history, and medical history, it may be recommended that you also have a yearly breast X-ray (mammogram).  If you have a family history of breast cancer, talk with your health care provider about genetic screening.  If you are at high risk for breast cancer, talk with your health care provider about having an MRI and a mammogram every year.  Breast cancer (BRCA) gene test is recommended for women who have family members with BRCA-related cancers. Results of the assessment will determine the need for genetic counseling and BRCA1 and for BRCA2 testing. BRCA-related cancers include these types:  Breast. This occurs in males or females.  Ovarian.  Tubal. This may also be called fallopian tube cancer.  Cancer of the abdominal or pelvic lining (peritoneal cancer).  Prostate.  Pancreatic. Cervical, Uterine, and Ovarian Cancer Your health care provider may recommend that you be screened regularly for cancer of the pelvic organs. These include your ovaries, uterus, and vagina. This screening involves a pelvic exam, which includes checking for microscopic changes to the surface of your cervix (Pap test).  For women ages 21-65, health care providers may recommend a pelvic exam and a Pap test every three years. For women ages 80-65, they may recommend the Pap test and pelvic exam, combined with testing for human papilloma virus (HPV), every five years. Some types of HPV increase your risk of cervical cancer. Testing for HPV may also be done on women of  any age who have unclear Pap test results.  Other health care providers may not recommend any screening for nonpregnant women who are considered low risk for pelvic cancer and have no symptoms. Ask your health care provider if a screening pelvic exam is right for you.  If you have had past treatment for cervical cancer or a condition that could lead to cancer, you need Pap tests and screening for cancer for at least 20 years after your treatment. If Pap tests have been discontinued for you, your risk factors (such as having a new sexual partner) need to be reassessed to determine if you should start having screenings again. Some women have medical problems that increase the chance of getting cervical cancer. In these cases, your health care provider may recommend that you have screening and Pap tests more often.  If you have a family history of uterine cancer or ovarian cancer, talk with your health care provider about genetic screening.  If you have vaginal bleeding after reaching menopause, tell your health care provider.  There are currently no reliable tests available to screen for ovarian cancer. Lung Cancer Lung cancer screening is recommended for adults 34-52 years old who are at high risk for lung cancer because of a history of smoking. A yearly low-dose CT scan of the lungs is recommended if you:  Currently smoke.  Have a history of at least 30 pack-years of smoking and you currently smoke  or have quit within the past 15 years. A pack-year is smoking an average of one pack of cigarettes per day for one year. Yearly screening should:  Continue until it has been 15 years since you quit.  Stop if you develop a health problem that would prevent you from having lung cancer treatment. Colorectal Cancer  This type of cancer can be detected and can often be prevented.  Routine colorectal cancer screening usually begins at age 79 and continues through age 72.  If you have risk factors for  colon cancer, your health care provider may recommend that you be screened at an earlier age.  If you have a family history of colorectal cancer, talk with your health care provider about genetic screening.  Your health care provider may also recommend using home test kits to check for hidden blood in your stool.  A small camera at the end of a tube can be used to examine your colon directly (sigmoidoscopy or colonoscopy). This is done to check for the earliest forms of colorectal cancer.  Direct examination of the colon should be repeated every 5-10 years until age 18. However, if early forms of precancerous polyps or small growths are found or if you have a family history or genetic risk for colorectal cancer, you may need to be screened more often. Skin Cancer  Check your skin from head to toe regularly.  Monitor any moles. Be sure to tell your health care provider:  About any new moles or changes in moles, especially if there is a change in a mole's shape or color.  If you have a mole that is larger than the size of a pencil eraser.  If any of your family members has a history of skin cancer, especially at a young age, talk with your health care provider about genetic screening.  Always use sunscreen. Apply sunscreen liberally and repeatedly throughout the day.  Whenever you are outside, protect yourself by wearing long sleeves, pants, a wide-brimmed hat, and sunglasses. WHAT SHOULD I KNOW ABOUT OSTEOPOROSIS? Osteoporosis is a condition in which bone destruction happens more quickly than new bone creation. After menopause, you may be at an increased risk for osteoporosis. To help prevent osteoporosis or the bone fractures that can happen because of osteoporosis, the following is recommended:  If you are 79-49 years old, get at least 1,000 mg of calcium and at least 600 mg of vitamin D per day.  If you are older than age 46 but younger than age 5, get at least 1,200 mg of calcium and  at least 600 mg of vitamin D per day.  If you are older than age 54, get at least 1,200 mg of calcium and at least 800 mg of vitamin D per day. Smoking and excessive alcohol intake increase the risk of osteoporosis. Eat foods that are rich in calcium and vitamin D, and do weight-bearing exercises several times each week as directed by your health care provider. WHAT SHOULD I KNOW ABOUT HOW MENOPAUSE AFFECTS [place]? Depression may occur at any age, but it is more common as you become older. Common symptoms of depression include:  Low or sad mood.  Changes in sleep patterns.  Changes in appetite or eating patterns.  Feeling an overall lack of motivation or enjoyment of activities that you previously enjoyed.  Frequent crying spells. Talk with your health care provider if you think that you are experiencing depression. WHAT SHOULD I KNOW ABOUT IMMUNIZATIONS? It is important  that you get and maintain your immunizations. These include:  Tetanus, diphtheria, and pertussis (Tdap) booster vaccine.  Influenza every year before the flu season begins.  Pneumonia vaccine.  Shingles vaccine. Your health care provider may also recommend other immunizations.   This information is not intended to replace advice given to you by your health care provider. Make sure you discuss any questions you have with your health care provider.   Document Released: 06/14/2005 Document Revised: 05/13/2014 Document Reviewed: 12/23/2013 Elsevier Interactive Patient Education [employer].

## 2015-07-11 NOTE — Assessment & Plan Note (Signed)
Continue to follow with cardiology. Stable. Continue current regimen. Continue to monitor. Labs checked today.

## 2015-07-11 NOTE — Assessment & Plan Note (Signed)
Under good control last check, but doesn't feel like she is right. Continue current regimen. Continue to monitor. Labs checked today. Adjust dose as needed.

## 2015-07-11 NOTE — Assessment & Plan Note (Signed)
Appointment with hematology made for 3/17. Likely needs IV iron. CBC checked today with iron studies. Await results. Follow up with hematology.

## 2015-07-11 NOTE — Assessment & Plan Note (Signed)
Under good control. Continue current regimen. Continue to monitor. Labs checked today.

## 2015-07-11 NOTE — Assessment & Plan Note (Signed)
Labs checked today. Await results. Continue to monitor. Would like to do lung cancer screening, but not right now.

## 2015-07-11 NOTE — Assessment & Plan Note (Signed)
Under good control last check. Bad myalgias with statins. Will decrease to 2.5mg  every other day. Continue current regimen. Continue to monitor. Labs checked today.

## 2015-07-11 NOTE — Assessment & Plan Note (Signed)
Possibly acting up a bit because of the anemia. Continue to follow with cardiology. Continue current regimen. Continue to monitor. Call if not getting better. See hematology. Labs checked today.

## 2015-07-11 NOTE — Assessment & Plan Note (Signed)
Checking labs today. Await results. Continue to follow with nephrology. Call with any concerns.  

## 2015-07-12 ENCOUNTER — Telehealth: Payer: Self-pay | Admitting: Family Medicine

## 2015-07-12 DIAGNOSIS — E039 Hypothyroidism, unspecified: Secondary | ICD-10-CM

## 2015-07-12 LAB — COMPREHENSIVE METABOLIC PANEL
ALBUMIN: 3.5 g/dL — AB (ref 3.6–4.8)
ALK PHOS: 85 IU/L (ref 39–117)
ALT: 8 IU/L (ref 0–32)
AST: 15 IU/L (ref 0–40)
Albumin/Globulin Ratio: 1.5 (ref 1.1–2.5)
BUN/Creatinine Ratio: 18 (ref 11–26)
BUN: 17 mg/dL (ref 8–27)
Bilirubin Total: <0.2 mg/dL (ref 0.0–1.2)
CO2: 21 mmol/L (ref 18–29)
CREATININE: 0.97 mg/dL (ref 0.57–1.00)
Calcium: 7.9 mg/dL — ABNORMAL LOW (ref 8.7–10.3)
Chloride: 103 mmol/L (ref 96–106)
GFR, EST AFRICAN AMERICAN: 71 mL/min/{1.73_m2} (ref >59)
GFR, EST NON AFRICAN AMERICAN: 61 mL/min/{1.73_m2} (ref >59)
GLOBULIN, TOTAL: 2.3 g/dL (ref 1.5–4.5)
Glucose: 93 mg/dL (ref 65–99)
POTASSIUM: 5.6 mmol/L — AB (ref 3.5–5.2)
Sodium: 137 mmol/L (ref 134–144)
Total Protein: 5.8 g/dL — ABNORMAL LOW (ref 6.0–8.5)

## 2015-07-12 LAB — LIPID PANEL W/O CHOL/HDL RATIO
CHOLESTEROL TOTAL: 191 mg/dL (ref 100–199)
HDL: 48 mg/dL (ref >39)
LDL Calculated: 111 mg/dL — ABNORMAL HIGH (ref 0–99)
TRIGLYCERIDES: 161 mg/dL — AB (ref 0–149)
VLDL Cholesterol Cal: 32 mg/dL (ref 5–40)

## 2015-07-12 LAB — IRON AND TIBC
IRON SATURATION: 4 % — AB (ref 15–55)
IRON: 12 ug/dL — AB (ref 27–139)
Total Iron Binding Capacity: 321 ug/dL (ref 250–450)
UIBC: 309 ug/dL (ref 118–369)

## 2015-07-12 LAB — TSH: TSH: 15.02 u[IU]/mL — AB (ref 0.450–4.500)

## 2015-07-12 LAB — FERRITIN: Ferritin: 7 ng/mL — ABNORMAL LOW (ref 15–150)

## 2015-07-12 MED ORDER — LEVOTHYROXINE SODIUM 100 MCG PO TABS: 100.0000 ug | ORAL_TABLET | Freq: Every day | ORAL | Status: DC

## 2015-07-12 NOTE — Telephone Encounter (Signed)
Called [place], [place] her the results of her labwork. Thyroid off. Will increase dose and recheck 6 weeks. Lab ordered. On schedule for 08/23/15 at [facility]

## 2015-07-13 ENCOUNTER — Telehealth: Payer: Self-pay | Admitting: Family Medicine

## 2015-07-13 MED ORDER — NITROFURANTOIN MONOHYD MACRO 100 MG PO CAPS: 100.0000 mg | ORAL_CAPSULE | Freq: Two times a day (BID) | ORAL | Status: DC

## 2015-07-13 NOTE — Telephone Encounter (Signed)
Called patient to let her know that her urine culture grew out e-coli. Will treat with nitrofurantoin. Call if not getting better or getting worse.

## 2015-07-14 LAB — CBC WITH DIFFERENTIAL/PLATELET
HEMOGLOBIN: 9 g/dL — AB (ref 11.1–15.9)
Hematocrit: 30.1 % — ABNORMAL LOW (ref 34.0–46.6)
Lymphocytes Absolute: 2.4 10*3/uL (ref 0.7–3.1)
Lymphs: 29 %
MCH: 23.6 pg — AB (ref 26.6–33.0)
MCHC: 29.9 g/dL — AB (ref 31.5–35.7)
MCV: 79 fL (ref 79–97)
MID (Absolute): 0.9 10*3/uL (ref 0.1–1.6)
MID: 12 %
NEUTROS ABS: 4.9 10*3/uL (ref 1.4–7.0)
NEUTROS PCT: 59 %
Platelets: 628 10*3/uL — ABNORMAL HIGH (ref 150–379)
RBC: 3.82 x10E6/uL (ref 3.77–5.28)
RDW: 19.2 % — ABNORMAL HIGH (ref 12.3–15.4)
WBC: 8.2 10*3/uL (ref 3.4–10.8)

## 2015-07-14 LAB — UA/M W/RFLX CULTURE, ROUTINE
BILIRUBIN UA: NEGATIVE
GLUCOSE, UA: NEGATIVE
KETONES UA: NEGATIVE
NITRITE UA: NEGATIVE
Protein, UA: NEGATIVE
RBC UA: NEGATIVE
SPEC GRAV UA: 1.015 (ref 1.005–1.030)
UUROB: 0.2 mg/dL (ref 0.2–1.0)
pH, UA: 5.5 (ref 5.0–7.5)

## 2015-07-14 LAB — MICROSCOPIC EXAMINATION

## 2015-07-14 LAB — URINE CULTURE, REFLEX

## 2015-07-14 LAB — MICROALBUMIN, URINE WAIVED
Creatinine, Urine Waived: 100 mg/dL (ref 10–300)
MICROALB, UR WAIVED: 30 mg/L — AB (ref 0–19)
Microalb/Creat Ratio: <30 mg/g (ref <30)

## 2015-07-21 ENCOUNTER — Ambulatory Visit: Payer: Medicare Other | Admitting: Oncology

## 2015-07-21 ENCOUNTER — Telehealth: Payer: Self-pay

## 2015-07-21 NOTE — Telephone Encounter (Signed)
Stop antibiotic. Call on Monday if still happening and we will get her a stool kit to r/o c. diff

## 2015-07-21 NOTE — Telephone Encounter (Signed)
Patient spoke with home health she states that she is really sick worse than she was, she has had extreme diarrhea to the point where there may be some blood in it. Unsure if this is from the antibiotic. Please call patient and advise what to do.

## 2015-07-25 ENCOUNTER — Other Ambulatory Visit: Payer: Self-pay | Admitting: Family Medicine

## 2015-07-25 MED ORDER — HYDROCODONE-ACETAMINOPHEN 10-325 MG PO TABS: 1.0000 | ORAL_TABLET | Freq: Two times a day (BID) | ORAL | Status: DC | PRN

## 2015-07-30 IMAGING — CR DG CHEST 2V
1 series · 2 of 2 positions shown · non-contrast
Comparison: February 10, 2014

CLINICAL DATA: Chronic difficulty breathing

EXAM:
CHEST  2 VIEW

[Series 1: dxr chest pa (or ap) and lateral · 0.14mm/px · 2 of 2 slices shown]
[im 1/2]
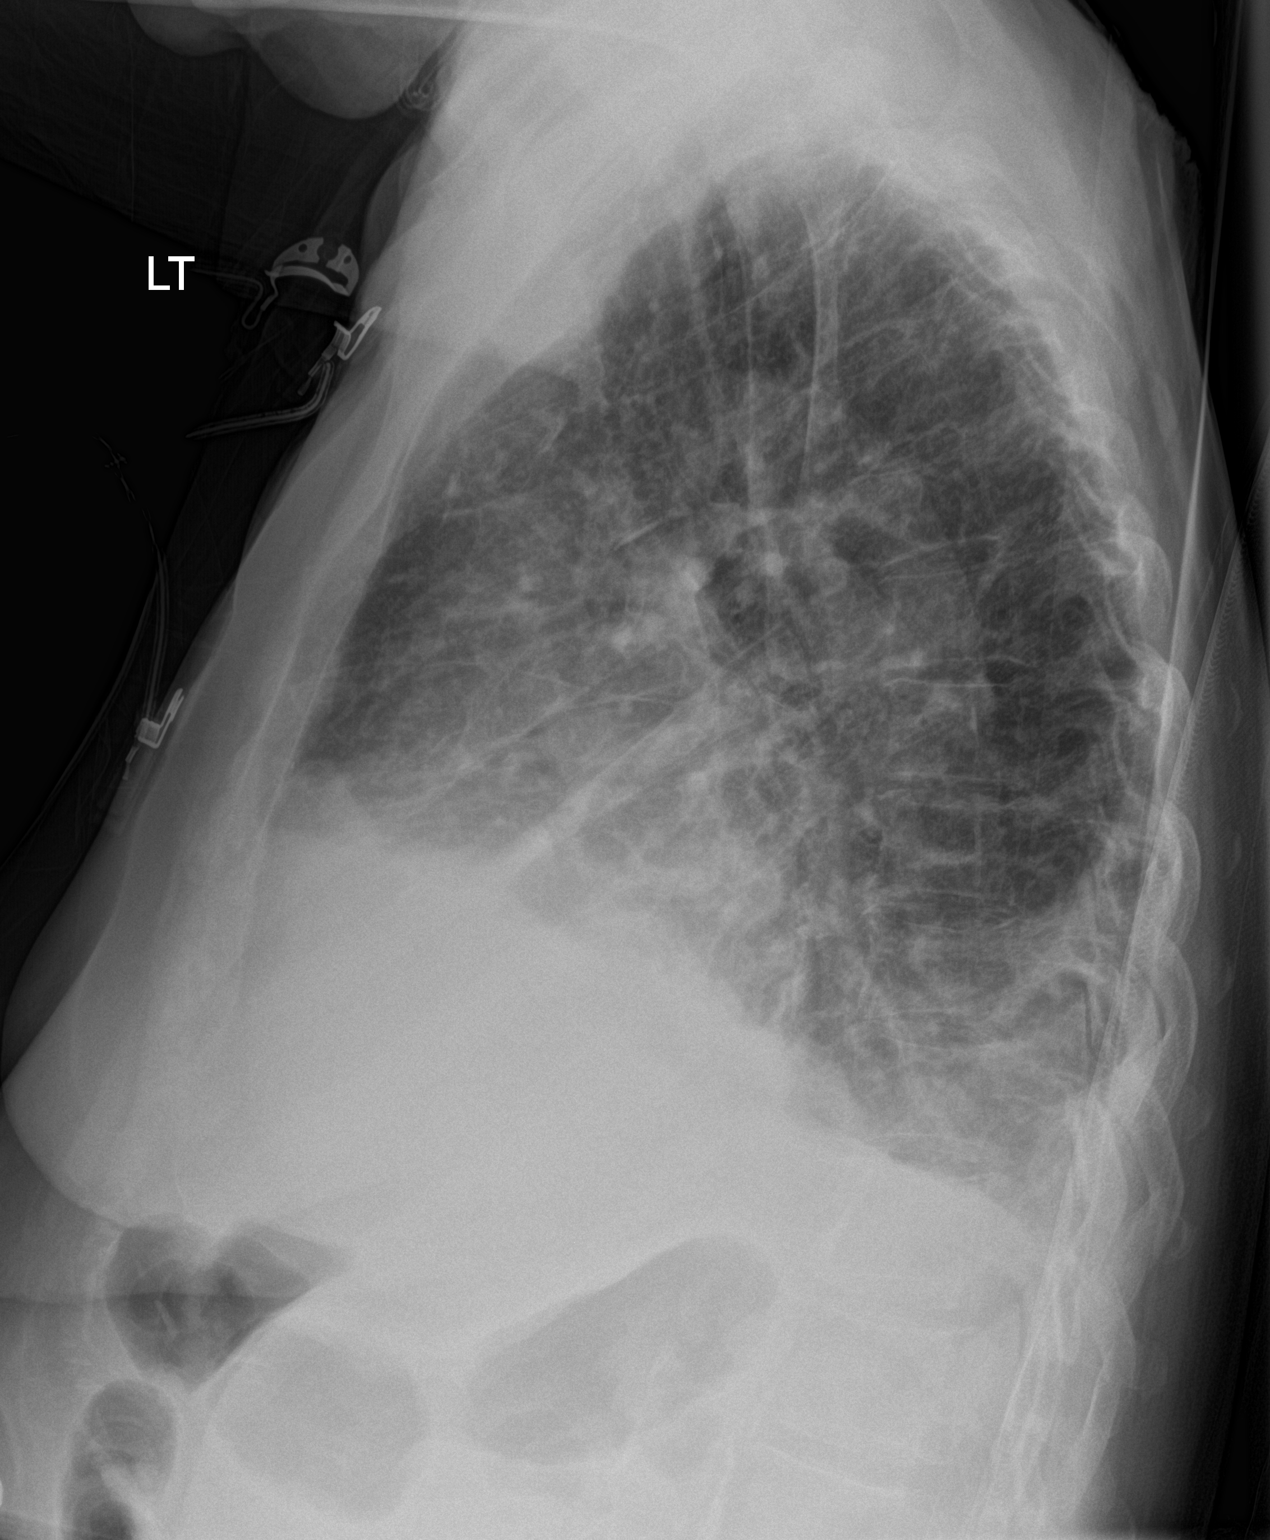
[im 2/2]
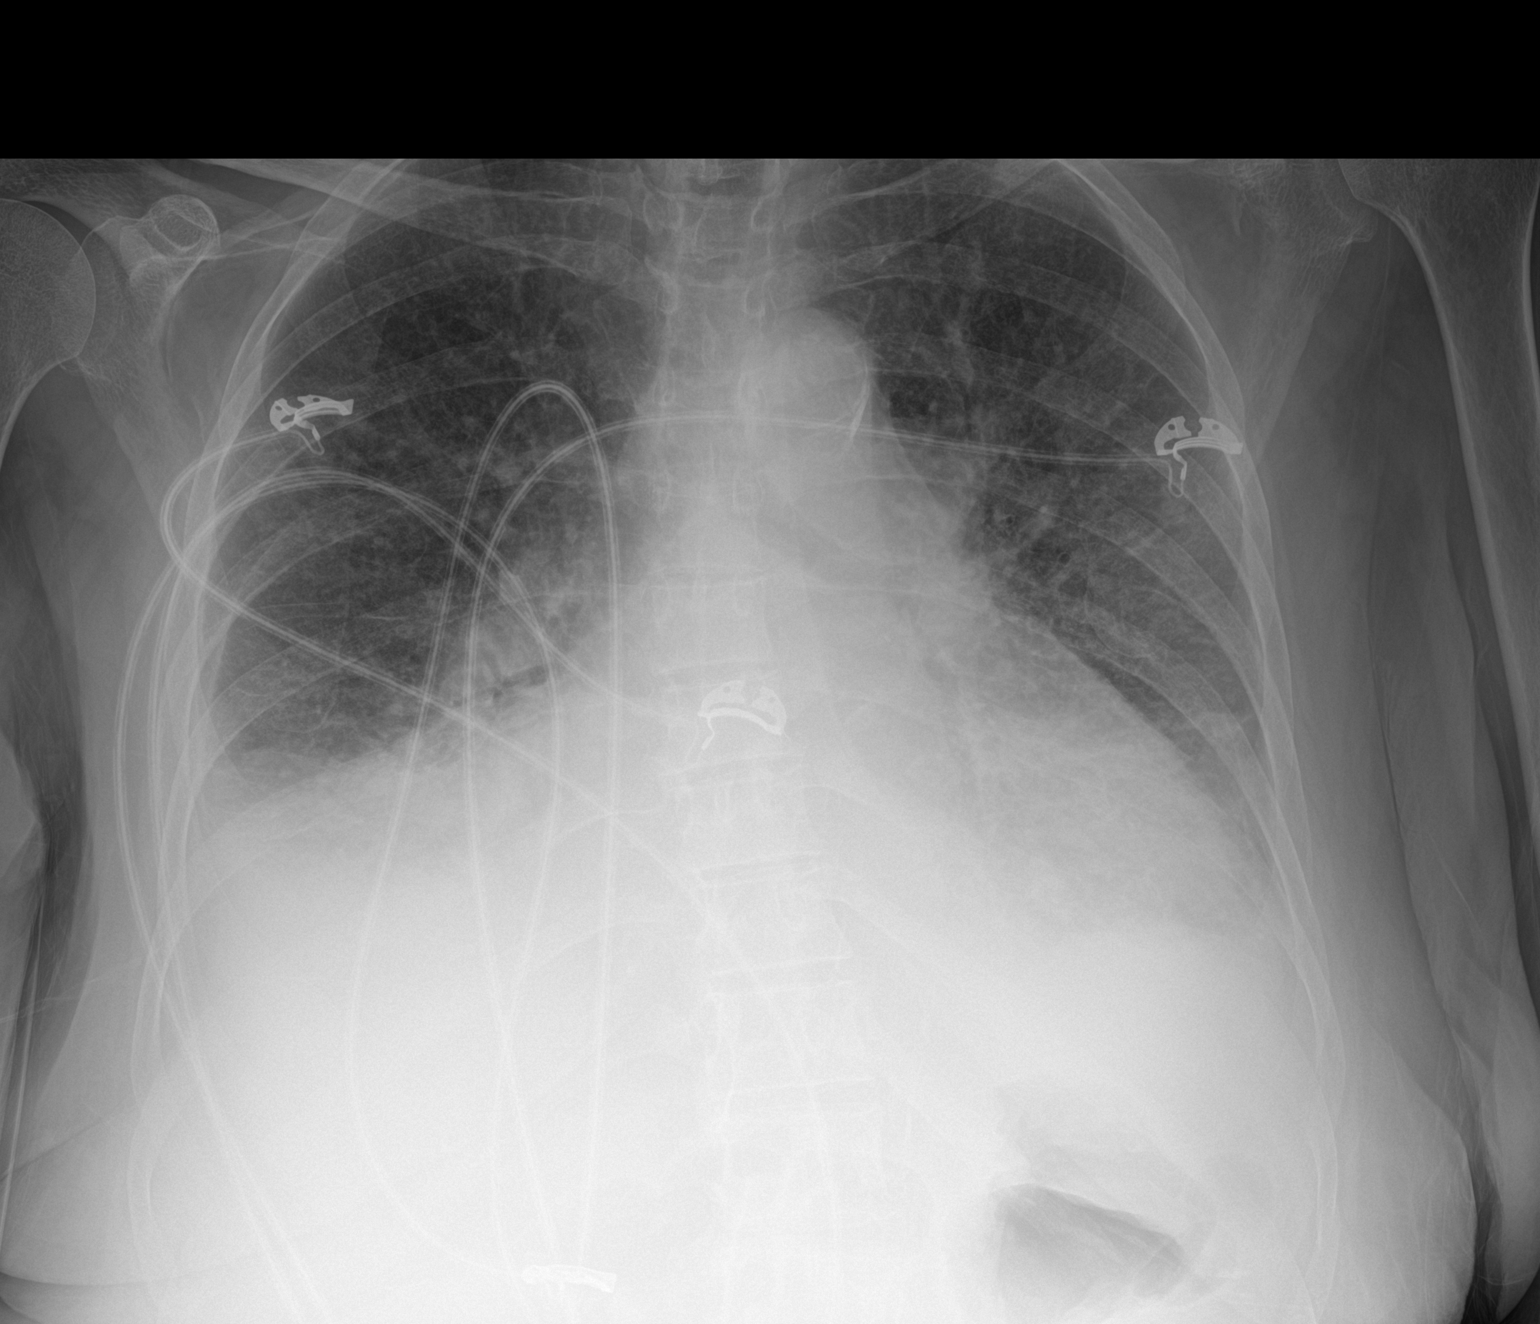

[2 of 2 positions shown; findings below may reference images not displayed]

FINDINGS: There is cardiomegaly with pulmonary venous hypertension. There are
small bilateral effusions with generalized interstitial edema. There
is no appreciable airspace consolidation. No adenopathy. There is
atherosclerotic change in the aorta.
IMPRESSION: Congestive heart failure.  No airspace consolidation.

## 2015-08-01 DIAGNOSIS — I5021 Acute systolic (congestive) heart failure: Secondary | ICD-10-CM | POA: Diagnosis not present

## 2015-08-04 ENCOUNTER — Ambulatory Visit (INDEPENDENT_AMBULATORY_CARE_PROVIDER_SITE_OTHER): Payer: Medicare Other | Admitting: Family Medicine

## 2015-08-04 ENCOUNTER — Encounter: Payer: Self-pay | Admitting: Family Medicine

## 2015-08-04 VITALS — BP 104/64 | HR 64 | Temp 99.6°F | Ht 62.1 in | Wt 135.0 lb

## 2015-08-04 DIAGNOSIS — R410 Disorientation, unspecified: Secondary | ICD-10-CM

## 2015-08-04 DIAGNOSIS — R350 Frequency of micturition: Secondary | ICD-10-CM | POA: Diagnosis not present

## 2015-08-04 DIAGNOSIS — E039 Hypothyroidism, unspecified: Secondary | ICD-10-CM

## 2015-08-04 DIAGNOSIS — N3000 Acute cystitis without hematuria: Secondary | ICD-10-CM

## 2015-08-04 MED ORDER — HYDROCODONE-ACETAMINOPHEN 10-325 MG PO TABS: 1.0000 | ORAL_TABLET | Freq: Three times a day (TID) | ORAL | Status: DC | PRN

## 2015-08-04 MED ORDER — CIPROFLOXACIN HCL 250 MG PO TABS: 250.0000 mg | ORAL_TABLET | Freq: Two times a day (BID) | ORAL | Status: DC

## 2015-08-04 NOTE — Progress Notes (Signed)
BP 104/64 mmHg  Pulse 64  Temp(Src) 99.6 F (37.6 C)  Ht 5' 2.1" (1.577 m)  Wt 135 lb (61.236 kg)  BMI 24.62 kg/m2  SpO2 96%   Subjective:    Patient ID: [name], female    DOB: Jun 18, 1950, 65 y.o.   MRN: [id number]  HPI: [name] is a 65 y.o. female  Chief Complaint  Patient presents with  . Urinary Tract Infection  . Fatigue   URINARY SYMPTOMS- couldn't take the nitrofurantoin, took it for 3 days, but it gave her diarrhea, didn't feel better Duration: about a month Dysuria: yes Urinary frequency: yes Urgency: yes Small volume voids: yes Symptom severity: no Urinary incontinence: no Foul odor: no Hematuria: no Abdominal pain: yes Back pain: no Suprapubic pain/pressure: no Flank pain: no Fever:  no Vomiting: no Treatments attempted: antibiotics and increasing fluids   HYPOTHYROIDISM Thyroid control status:uncontrolled Satisfied with current treatment? no Medication side effects: no Medication compliance: excellent compliance Recent dose adjustment:yes Fatigue: yes Cold intolerance: no Heat intolerance: no Weight gain: no Weight loss: no Constipation: no Diarrhea/loose stools: no Palpitations: no Lower extremity edema: no Anxiety/depressed mood: no  Relevant past medical, surgical, family and social history reviewed and updated as indicated. Interim medical history since our last visit reviewed. Allergies and medications reviewed and updated.  Review of Systems  Constitutional: Positive for activity change and fatigue. Negative for fever, chills, diaphoresis, appetite change and unexpected weight change.  Respiratory: Negative.   Cardiovascular: Positive for leg swelling. Negative for chest pain and palpitations.  Endocrine: Positive for polyuria. Negative for cold intolerance, heat intolerance, polydipsia and polyphagia.  Psychiatric/Behavioral: Negative.     Per HPI unless specifically indicated above     Objective:    BP 104/64  mmHg  Pulse 64  Temp(Src) 99.6 F (37.6 C)  Ht 5' 2.1" (1.577 m)  Wt 135 lb (61.236 kg)  BMI 24.62 kg/m2  SpO2 96%  Wt Readings from Last 3 Encounters:  08/04/15 135 lb (61.236 kg)  07/11/15 136 lb (61.689 kg)  06/22/15 135 lb (61.236 kg)    Physical Exam  Constitutional: She is oriented to person, place, and time. She appears well-developed and well-nourished. No distress.  HENT:  Head: Normocephalic and atraumatic.  Right Ear: Hearing and external ear normal.  Left Ear: Hearing and external ear normal.  Nose: Nose normal.  Mouth/Throat: Oropharynx is clear and moist. No oropharyngeal exudate.  Eyes: Conjunctivae, EOM and lids are normal. Pupils are equal, round, and reactive to light. Right eye exhibits no discharge. Left eye exhibits no discharge. No scleral icterus.  Neck: Normal range of motion. Neck supple. No JVD present. No tracheal deviation present. No thyromegaly present.  endarterctomy scar healing well  Pulmonary/Chest: Effort normal. No stridor. No respiratory distress.  Musculoskeletal: Normal range of motion. She exhibits edema.  Lymphadenopathy:    She has no cervical adenopathy.  Neurological: She is alert and oriented to person, place, and time.  Skin: Skin is warm, dry and intact. No rash noted. She is not diaphoretic. No erythema. No pallor.  Psychiatric: She has a normal mood and affect. Her speech is normal and behavior is normal. Judgment and thought content normal. Cognition and memory are normal.  Nursing note and vitals reviewed.   Results for orders placed or performed in visit on 07/11/15  Microscopic Examination  Result Value Ref Range   WBC, UA 6-10 (A) 0 -  5 /hpf   RBC, UA 0-2 0 -  2 /hpf   Epithelial Cells (non renal) 0-10 0 - 10 /hpf   Mucus, UA Present Not Estab.   Bacteria, UA Few None seen/Few  CBC With Differential/Platelet  Result Value Ref Range   WBC 8.2 3.4 - 10.8 x10E3/uL   RBC 3.82 3.77 - 5.28 x10E6/uL   Hemoglobin 9.0 (L)  11.1 - 15.9 g/dL   Hematocrit 30.1 (L) 34.0 - 46.6 %   MCV 79 79 - 97 fL   MCH 23.6 (L) 26.6 - 33.0 pg   MCHC 29.9 (L) 31.5 - 35.7 g/dL   RDW 19.2 (H) 12.3 - 15.4 %   Platelets 628 (H) 150 - 379 x10E3/uL   Neutrophils 59 %   Lymphs 29 %   MID 12 %   Neutrophils Absolute 4.9 1.4 - 7.0 x10E3/uL   Lymphocytes Absolute 2.4 0.7 - 3.1 x10E3/uL   MID (Absolute) 0.9 0.1 - 1.6 X10E3/uL  Comprehensive metabolic panel  Result Value Ref Range   Glucose 93 65 - 99 mg/dL   BUN 17 8 - 27 mg/dL   Creatinine, Ser 0.97 0.57 - 1.00 mg/dL   GFR calc non Af Amer 61 >59 mL/min/1.73   GFR calc Af Amer 71 >59 mL/min/1.73   BUN/Creatinine Ratio 18 11 - 26   Sodium 137 134 - 144 mmol/L   Potassium 5.6 (H) 3.5 - 5.2 mmol/L   Chloride 103 96 - 106 mmol/L   CO2 21 18 - 29 mmol/L   Calcium 7.9 (L) 8.7 - 10.3 mg/dL   Total Protein 5.8 (L) 6.0 - 8.5 g/dL   Albumin 3.5 (L) 3.6 - 4.8 g/dL   Globulin, Total 2.3 1.5 - 4.5 g/dL   Albumin/Globulin Ratio 1.5 1.1 - 2.5   Bilirubin Total <0.2 0.0 - 1.2 mg/dL   Alkaline Phosphatase 85 39 - 117 IU/L   AST 15 0 - 40 IU/L   ALT 8 0 - 32 IU/L  Iron and TIBC  Result Value Ref Range   Total Iron Binding Capacity 321 250 - 450 ug/dL   UIBC 309 118 - 369 ug/dL   Iron 12 (L) 27 - 139 ug/dL   Iron Saturation 4 (LL) 15 - 55 %  Lipid Panel w/o Chol/HDL Ratio  Result Value Ref Range   Cholesterol, Total 191 100 - 199 mg/dL   Triglycerides 161 (H) 0 - 149 mg/dL   HDL 48 >39 mg/dL   VLDL Cholesterol Cal 32 5 - 40 mg/dL   LDL Calculated 111 (H) 0 - 99 mg/dL  Microalbumin, Urine Waived  Result Value Ref Range   Microalb, Ur Waived 30 (H) 0 - 19 mg/L   Creatinine, Urine Waived 100 10 - 300 mg/dL   Microalb/Creat Ratio <30 <30 mg/g  TSH  Result Value Ref Range   TSH 15.020 (H) 0.450 - 4.500 uIU/mL  UA/M w/rflx Culture, Routine  Result Value Ref Range   Specific Gravity, UA 1.015 1.005 - 1.030   pH, UA 5.5 5.0 - 7.5   Color, UA Yellow Yellow   Appearance Ur Cloudy (A)  Clear   Leukocytes, UA 2+ (A) Negative   Protein, UA Negative Negative/Trace   Glucose, UA Negative Negative   Ketones, UA Negative Negative   RBC, UA Negative Negative   Bilirubin, UA Negative Negative   Urobilinogen, Ur 0.2 0.2 - 1.0 mg/dL   Nitrite, UA Negative Negative   Microscopic Examination See below:    Urinalysis Reflex Comment   Ferritin  Result Value Ref Range  Ferritin 7 (L) 15 - 150 ng/mL  Urine Culture, Routine  Result Value Ref Range   Urine Culture, Routine Final report (A)    Urine Culture result 1 Klebsiella oxytoca (A)    ANTIMICROBIAL SUSCEPTIBILITY Comment       Assessment & Plan:   Problem List Items Addressed This Visit      Endocrine   Hypothyroidism    Due for recheck on her thyroid 08/23/15. Will return then and we will adjust as needed.        Other Visit Diagnoses    Acute cystitis without hematuria    -  Primary    Will treat with cipro as didn't tolerate nitrofurantoin. Call if not better by Monday. Push fluids    Urinary frequency        Will check UA, partially treated, but couldn't tolerate her nitrofurantoin    Relevant Orders    UA/M w/rflx Culture, Routine    Confusion        Multifactorial. Likely due to her anemia, hypothyroid and recent surgery. Continue to monitor. Recheck thyroid in 3 weeks. Call with concerns.     Relevant Orders    UA/M w/rflx Culture, Routine        Follow up plan: Return As scheduled.

## 2015-08-04 NOTE — Assessment & Plan Note (Addendum)
Due for recheck on her thyroid 08/23/15. Will return then and we will adjust as needed.

## 2015-08-06 LAB — MICROSCOPIC EXAMINATION

## 2015-08-06 LAB — UA/M W/RFLX CULTURE, ROUTINE
BILIRUBIN UA: NEGATIVE
Glucose, UA: NEGATIVE
Ketones, UA: NEGATIVE
Nitrite, UA: NEGATIVE
Protein, UA: NEGATIVE
RBC, UA: NEGATIVE
SPEC GRAV UA: 1.015 (ref 1.005–1.030)
Urobilinogen, Ur: 0.2 mg/dL (ref 0.2–1.0)
pH, UA: 6 (ref 5.0–7.5)

## 2015-08-06 LAB — URINE CULTURE, REFLEX: Organism ID, Bacteria: NO GROWTH

## 2015-08-10 ENCOUNTER — Inpatient Hospital Stay: Payer: Medicare Other | Attending: Oncology | Admitting: Oncology

## 2015-08-10 VITALS — BP 124/73 | HR 92 | Temp 99.6°F | Wt 136.9 lb

## 2015-08-10 DIAGNOSIS — Z79899 Other long term (current) drug therapy: Secondary | ICD-10-CM | POA: Insufficient documentation

## 2015-08-10 DIAGNOSIS — I428 Other cardiomyopathies: Secondary | ICD-10-CM | POA: Insufficient documentation

## 2015-08-10 DIAGNOSIS — Z801 Family history of malignant neoplasm of trachea, bronchus and lung: Secondary | ICD-10-CM | POA: Diagnosis not present

## 2015-08-10 DIAGNOSIS — I739 Peripheral vascular disease, unspecified: Secondary | ICD-10-CM | POA: Diagnosis not present

## 2015-08-10 DIAGNOSIS — E039 Hypothyroidism, unspecified: Secondary | ICD-10-CM

## 2015-08-10 DIAGNOSIS — M545 Low back pain: Secondary | ICD-10-CM

## 2015-08-10 DIAGNOSIS — G8929 Other chronic pain: Secondary | ICD-10-CM

## 2015-08-10 DIAGNOSIS — F329 Major depressive disorder, single episode, unspecified: Secondary | ICD-10-CM

## 2015-08-10 DIAGNOSIS — M4800 Spinal stenosis, site unspecified: Secondary | ICD-10-CM | POA: Insufficient documentation

## 2015-08-10 DIAGNOSIS — I5022 Chronic systolic (congestive) heart failure: Secondary | ICD-10-CM | POA: Insufficient documentation

## 2015-08-10 DIAGNOSIS — D473 Essential (hemorrhagic) thrombocythemia: Secondary | ICD-10-CM | POA: Diagnosis not present

## 2015-08-10 DIAGNOSIS — F17210 Nicotine dependence, cigarettes, uncomplicated: Secondary | ICD-10-CM

## 2015-08-10 DIAGNOSIS — Z8701 Personal history of pneumonia (recurrent): Secondary | ICD-10-CM | POA: Diagnosis not present

## 2015-08-10 DIAGNOSIS — D509 Iron deficiency anemia, unspecified: Secondary | ICD-10-CM | POA: Diagnosis not present

## 2015-08-10 DIAGNOSIS — Z8711 Personal history of peptic ulcer disease: Secondary | ICD-10-CM | POA: Diagnosis not present

## 2015-08-10 DIAGNOSIS — K219 Gastro-esophageal reflux disease without esophagitis: Secondary | ICD-10-CM | POA: Diagnosis not present

## 2015-08-10 DIAGNOSIS — E785 Hyperlipidemia, unspecified: Secondary | ICD-10-CM

## 2015-08-10 DIAGNOSIS — Z7982 Long term (current) use of aspirin: Secondary | ICD-10-CM | POA: Diagnosis not present

## 2015-08-10 DIAGNOSIS — M129 Arthropathy, unspecified: Secondary | ICD-10-CM

## 2015-08-10 DIAGNOSIS — I2510 Atherosclerotic heart disease of native coronary artery without angina pectoris: Secondary | ICD-10-CM | POA: Diagnosis not present

## 2015-08-10 DIAGNOSIS — I252 Old myocardial infarction: Secondary | ICD-10-CM | POA: Diagnosis not present

## 2015-08-10 DIAGNOSIS — I10 Essential (primary) hypertension: Secondary | ICD-10-CM | POA: Insufficient documentation

## 2015-08-10 NOTE — Progress Notes (Signed)
Patient has history of anemia that improved over the last year but is dropping again.  Also has history of blood transfusions with the last transfusion in 2013.  She is feeling fatigued.

## 2015-08-16 DIAGNOSIS — I10 Essential (primary) hypertension: Secondary | ICD-10-CM | POA: Diagnosis not present

## 2015-08-16 DIAGNOSIS — I6523 Occlusion and stenosis of bilateral carotid arteries: Secondary | ICD-10-CM | POA: Diagnosis not present

## 2015-08-16 DIAGNOSIS — F17200 Nicotine dependence, unspecified, uncomplicated: Secondary | ICD-10-CM | POA: Diagnosis not present

## 2015-08-16 DIAGNOSIS — I2510 Atherosclerotic heart disease of native coronary artery without angina pectoris: Secondary | ICD-10-CM | POA: Diagnosis not present

## 2015-08-16 DIAGNOSIS — I6522 Occlusion and stenosis of left carotid artery: Secondary | ICD-10-CM | POA: Diagnosis not present

## 2015-08-16 DIAGNOSIS — E785 Hyperlipidemia, unspecified: Secondary | ICD-10-CM | POA: Diagnosis not present

## 2015-08-16 DIAGNOSIS — I509 Heart failure, unspecified: Secondary | ICD-10-CM | POA: Diagnosis not present

## 2015-08-17 ENCOUNTER — Inpatient Hospital Stay: Payer: Medicare Other

## 2015-08-17 ENCOUNTER — Other Ambulatory Visit: Payer: Self-pay | Admitting: Hematology and Oncology

## 2015-08-17 VITALS — BP 108/67 | HR 90 | Temp 98.9°F | Resp 18

## 2015-08-17 DIAGNOSIS — D509 Iron deficiency anemia, unspecified: Secondary | ICD-10-CM

## 2015-08-17 MED ORDER — SODIUM CHLORIDE 0.9 % IV SOLN
510.0000 mg | Freq: Once | INTRAVENOUS | Status: AC
  Administered 2015-08-17: 510 mg via INTRAVENOUS
  Filled 2015-08-17: qty 17

## 2015-08-17 MED ORDER — SODIUM CHLORIDE 0.9 % IV SOLN
Freq: Once | INTRAVENOUS | Status: AC
  Administered 2015-08-17: 14:00:00 via INTRAVENOUS
  Filled 2015-08-17: qty 1000
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
## 2015-08-21 ENCOUNTER — Other Ambulatory Visit: Payer: Self-pay | Admitting: Family Medicine

## 2015-08-21 MED ORDER — HYDROCODONE-ACETAMINOPHEN 10-325 MG PO TABS: 1.0000 | ORAL_TABLET | Freq: Three times a day (TID) | ORAL | Status: DC | PRN

## 2015-08-23 ENCOUNTER — Other Ambulatory Visit: Payer: Medicare Other

## 2015-08-25 ENCOUNTER — Inpatient Hospital Stay: Payer: Medicare Other

## 2015-08-25 VITALS — BP 147/68 | HR 88 | Temp 98.0°F | Resp 18

## 2015-08-25 DIAGNOSIS — D509 Iron deficiency anemia, unspecified: Secondary | ICD-10-CM

## 2015-08-25 MED ORDER — SODIUM CHLORIDE 0.9 % IV SOLN
Freq: Once | INTRAVENOUS | Status: AC
  Administered 2015-08-25: 14:00:00 via INTRAVENOUS
  Filled 2015-08-25: qty 1000

## 2015-08-25 MED ORDER — SODIUM CHLORIDE 0.9 % IV SOLN
510.0000 mg | Freq: Once | INTRAVENOUS | Status: AC
  Administered 2015-08-25: 510 mg via INTRAVENOUS
  Filled 2015-08-25: qty 17

## 2015-08-30 ENCOUNTER — Other Ambulatory Visit: Payer: Medicare Other

## 2015-08-30 ENCOUNTER — Other Ambulatory Visit: Payer: Self-pay | Admitting: Family Medicine

## 2015-08-30 DIAGNOSIS — E039 Hypothyroidism, unspecified: Secondary | ICD-10-CM | POA: Diagnosis not present

## 2015-08-31 ENCOUNTER — Telehealth: Payer: Self-pay | Admitting: Family Medicine

## 2015-08-31 DIAGNOSIS — D509 Iron deficiency anemia, unspecified: Secondary | ICD-10-CM

## 2015-08-31 DIAGNOSIS — N179 Acute kidney failure, unspecified: Secondary | ICD-10-CM

## 2015-08-31 DIAGNOSIS — E039 Hypothyroidism, unspecified: Secondary | ICD-10-CM

## 2015-08-31 LAB — TSH: TSH: 0.087 u[IU]/mL — ABNORMAL LOW (ref 0.450–4.500)

## 2015-08-31 MED ORDER — LEVOTHYROXINE SODIUM 88 MCG PO TABS: 88.0000 ug | ORAL_TABLET | Freq: Every day | ORAL | Status: DC

## 2015-08-31 NOTE — Telephone Encounter (Signed)
Manson Allan with her results. To go down to 79mcg and recheck in 6 weeks.

## 2015-09-01 DIAGNOSIS — I5021 Acute systolic (congestive) heart failure: Secondary | ICD-10-CM | POA: Diagnosis not present

## 2015-09-05 ENCOUNTER — Emergency Department: Payer: Medicare Other

## 2015-09-05 ENCOUNTER — Encounter: Payer: Self-pay | Admitting: Emergency Medicine

## 2015-09-05 ENCOUNTER — Inpatient Hospital Stay
Admission: EM | Admit: 2015-09-05 | Discharge: 2015-09-07 | DRG: 682 | Disposition: A | Discharge status: Home / Self Care | Payer: Medicare Other | Attending: Internal Medicine | Admitting: Internal Medicine

## 2015-09-05 ENCOUNTER — Encounter: Payer: Self-pay | Admitting: Family Medicine

## 2015-09-05 ENCOUNTER — Ambulatory Visit (INDEPENDENT_AMBULATORY_CARE_PROVIDER_SITE_OTHER): Payer: Medicare Other | Admitting: Family Medicine

## 2015-09-05 VITALS — BP 106/67 | HR 101 | Temp 98.3°F | Wt 139.1 lb

## 2015-09-05 DIAGNOSIS — F17210 Nicotine dependence, cigarettes, uncomplicated: Secondary | ICD-10-CM | POA: Diagnosis present

## 2015-09-05 DIAGNOSIS — I429 Cardiomyopathy, unspecified: Secondary | ICD-10-CM | POA: Diagnosis not present

## 2015-09-05 DIAGNOSIS — I5043 Acute on chronic combined systolic (congestive) and diastolic (congestive) heart failure: Secondary | ICD-10-CM | POA: Diagnosis not present

## 2015-09-05 DIAGNOSIS — R609 Edema, unspecified: Secondary | ICD-10-CM | POA: Diagnosis not present

## 2015-09-05 DIAGNOSIS — I951 Orthostatic hypotension: Secondary | ICD-10-CM

## 2015-09-05 DIAGNOSIS — M7989 Other specified soft tissue disorders: Secondary | ICD-10-CM | POA: Diagnosis present

## 2015-09-05 DIAGNOSIS — L03116 Cellulitis of left lower limb: Secondary | ICD-10-CM | POA: Diagnosis present

## 2015-09-05 DIAGNOSIS — R079 Chest pain, unspecified: Secondary | ICD-10-CM | POA: Diagnosis not present

## 2015-09-05 DIAGNOSIS — K219 Gastro-esophageal reflux disease without esophagitis: Secondary | ICD-10-CM | POA: Diagnosis present

## 2015-09-05 DIAGNOSIS — R0902 Hypoxemia: Secondary | ICD-10-CM

## 2015-09-05 DIAGNOSIS — R42 Dizziness and giddiness: Secondary | ICD-10-CM | POA: Diagnosis not present

## 2015-09-05 DIAGNOSIS — I739 Peripheral vascular disease, unspecified: Secondary | ICD-10-CM | POA: Diagnosis present

## 2015-09-05 DIAGNOSIS — E0781 Sick-euthyroid syndrome: Secondary | ICD-10-CM | POA: Diagnosis present

## 2015-09-05 DIAGNOSIS — M545 Low back pain: Secondary | ICD-10-CM | POA: Diagnosis present

## 2015-09-05 DIAGNOSIS — L02419 Cutaneous abscess of limb, unspecified: Secondary | ICD-10-CM | POA: Diagnosis not present

## 2015-09-05 DIAGNOSIS — Z90710 Acquired absence of both cervix and uterus: Secondary | ICD-10-CM

## 2015-09-05 DIAGNOSIS — Z9981 Dependence on supplemental oxygen: Secondary | ICD-10-CM

## 2015-09-05 DIAGNOSIS — I2510 Atherosclerotic heart disease of native coronary artery without angina pectoris: Secondary | ICD-10-CM | POA: Diagnosis present

## 2015-09-05 DIAGNOSIS — L03115 Cellulitis of right lower limb: Secondary | ICD-10-CM | POA: Diagnosis present

## 2015-09-05 DIAGNOSIS — I5033 Acute on chronic diastolic (congestive) heart failure: Secondary | ICD-10-CM | POA: Diagnosis present

## 2015-09-05 DIAGNOSIS — Z801 Family history of malignant neoplasm of trachea, bronchus and lung: Secondary | ICD-10-CM

## 2015-09-05 DIAGNOSIS — Z9889 Other specified postprocedural states: Secondary | ICD-10-CM

## 2015-09-05 DIAGNOSIS — Z7982 Long term (current) use of aspirin: Secondary | ICD-10-CM

## 2015-09-05 DIAGNOSIS — Z886 Allergy status to analgesic agent status: Secondary | ICD-10-CM | POA: Diagnosis not present

## 2015-09-05 DIAGNOSIS — R0602 Shortness of breath: Secondary | ICD-10-CM | POA: Diagnosis not present

## 2015-09-05 DIAGNOSIS — Z79899 Other long term (current) drug therapy: Secondary | ICD-10-CM

## 2015-09-05 DIAGNOSIS — I252 Old myocardial infarction: Secondary | ICD-10-CM | POA: Diagnosis not present

## 2015-09-05 DIAGNOSIS — I5023 Acute on chronic systolic (congestive) heart failure: Secondary | ICD-10-CM

## 2015-09-05 DIAGNOSIS — N179 Acute kidney failure, unspecified: Secondary | ICD-10-CM | POA: Diagnosis not present

## 2015-09-05 DIAGNOSIS — I081 Rheumatic disorders of both mitral and tricuspid valves: Secondary | ICD-10-CM | POA: Diagnosis present

## 2015-09-05 DIAGNOSIS — N289 Disorder of kidney and ureter, unspecified: Secondary | ICD-10-CM

## 2015-09-05 DIAGNOSIS — Z8711 Personal history of peptic ulcer disease: Secondary | ICD-10-CM

## 2015-09-05 DIAGNOSIS — I110 Hypertensive heart disease with heart failure: Secondary | ICD-10-CM | POA: Diagnosis present

## 2015-09-05 DIAGNOSIS — Z8249 Family history of ischemic heart disease and other diseases of the circulatory system: Secondary | ICD-10-CM

## 2015-09-05 DIAGNOSIS — R600 Localized edema: Secondary | ICD-10-CM | POA: Diagnosis not present

## 2015-09-05 DIAGNOSIS — I872 Venous insufficiency (chronic) (peripheral): Secondary | ICD-10-CM | POA: Diagnosis present

## 2015-09-05 DIAGNOSIS — G8929 Other chronic pain: Secondary | ICD-10-CM | POA: Diagnosis present

## 2015-09-05 DIAGNOSIS — Z888 Allergy status to other drugs, medicaments and biological substances status: Secondary | ICD-10-CM | POA: Diagnosis not present

## 2015-09-05 DIAGNOSIS — R062 Wheezing: Secondary | ICD-10-CM

## 2015-09-05 DIAGNOSIS — N390 Urinary tract infection, site not specified: Secondary | ICD-10-CM

## 2015-09-05 DIAGNOSIS — R0600 Dyspnea, unspecified: Secondary | ICD-10-CM | POA: Diagnosis not present

## 2015-09-05 DIAGNOSIS — D509 Iron deficiency anemia, unspecified: Secondary | ICD-10-CM | POA: Diagnosis present

## 2015-09-05 DIAGNOSIS — E039 Hypothyroidism, unspecified: Secondary | ICD-10-CM | POA: Diagnosis present

## 2015-09-05 DIAGNOSIS — I5042 Chronic combined systolic (congestive) and diastolic (congestive) heart failure: Secondary | ICD-10-CM | POA: Diagnosis not present

## 2015-09-05 LAB — URINALYSIS COMPLETE WITH MICROSCOPIC (ARMC ONLY)
BILIRUBIN URINE: NEGATIVE
Bacteria, UA: NONE SEEN
GLUCOSE, UA: NEGATIVE mg/dL
Hgb urine dipstick: NEGATIVE
Ketones, ur: NEGATIVE mg/dL
Nitrite: NEGATIVE
PH: 5 (ref 5.0–8.0)
Protein, ur: NEGATIVE mg/dL
Specific Gravity, Urine: 1.023 (ref 1.005–1.030)

## 2015-09-05 LAB — CBC WITH DIFFERENTIAL/PLATELET
Basophils Absolute: 0.1 10*3/uL (ref 0–0.1)
Basophils Relative: 1 %
Eosinophils Absolute: 0.2 10*3/uL (ref 0–0.7)
Eosinophils Relative: 3 %
HCT: 33.6 % — ABNORMAL LOW (ref 35.0–47.0)
Hemoglobin: 10.5 g/dL — ABNORMAL LOW (ref 12.0–16.0)
Lymphocytes Relative: 20 %
Lymphs Abs: 1.3 10*3/uL (ref 1.0–3.6)
MCH: 25.9 pg — ABNORMAL LOW (ref 26.0–34.0)
MCHC: 31.2 g/dL — ABNORMAL LOW (ref 32.0–36.0)
MCV: 83.1 fL (ref 80.0–100.0)
Monocytes Absolute: 0.5 10*3/uL (ref 0.2–0.9)
Monocytes Relative: 7 %
Neutro Abs: 4.8 10*3/uL (ref 1.4–6.5)
Neutrophils Relative %: 69 %
Platelets: 461 10*3/uL — ABNORMAL HIGH (ref 150–440)
RBC: 4.04 MIL/uL (ref 3.80–5.20)
RDW: 30 % — ABNORMAL HIGH (ref 11.5–14.5)
WBC: 6.8 10*3/uL (ref 3.6–11.0)

## 2015-09-05 LAB — BASIC METABOLIC PANEL
Anion gap: 8 (ref 5–15)
BUN: 27 mg/dL — ABNORMAL HIGH (ref 6–20)
CALCIUM: 8.2 mg/dL — AB (ref 8.9–10.3)
CO2: 27 mmol/L (ref 22–32)
CREATININE: 1.36 mg/dL — AB (ref 0.44–1.00)
Chloride: 106 mmol/L (ref 101–111)
GFR calc non Af Amer: 40 mL/min — ABNORMAL LOW (ref >60)
GFR, EST AFRICAN AMERICAN: 46 mL/min — AB (ref >60)
Glucose, Bld: 96 mg/dL (ref 65–99)
Potassium: 3.9 mmol/L (ref 3.5–5.1)
Sodium: 141 mmol/L (ref 135–145)

## 2015-09-05 LAB — TROPONIN I: Troponin I: 0.03 ng/mL (ref <0.031)

## 2015-09-05 LAB — TSH: TSH: 0.099 u[IU]/mL — ABNORMAL LOW (ref 0.350–4.500)

## 2015-09-05 MED ORDER — HYDROMORPHONE HCL 1 MG/ML IJ SOLN
1.0000 mg | Freq: Once | INTRAMUSCULAR | Status: AC
  Administered 2015-09-05: 1 mg via INTRAVENOUS
  Filled 2015-09-05: qty 1

## 2015-09-05 MED ORDER — ALBUTEROL SULFATE (2.5 MG/3ML) 0.083% IN NEBU: 3.0000 mL | INHALATION_SOLUTION | RESPIRATORY_TRACT | Status: DC | PRN

## 2015-09-05 MED ORDER — ACETAMINOPHEN 650 MG RE SUPP: 650.0000 mg | Freq: Four times a day (QID) | RECTAL | Status: DC | PRN

## 2015-09-05 MED ORDER — CARVEDILOL 6.25 MG PO TABS: 3.1250 mg | ORAL_TABLET | Freq: Every day | ORAL | Status: DC | PRN

## 2015-09-05 MED ORDER — ENOXAPARIN SODIUM 40 MG/0.4ML ~~LOC~~ SOLN
40.0000 mg | SUBCUTANEOUS | Status: DC
  Administered 2015-09-05 – 2015-09-06 (×2): 40 mg via SUBCUTANEOUS
  Filled 2015-09-05 (×2): qty 0.4

## 2015-09-05 MED ORDER — ASPIRIN EC 81 MG PO TBEC
81.0000 mg | DELAYED_RELEASE_TABLET | Freq: Every day | ORAL | Status: DC
  Administered 2015-09-06 – 2015-09-07 (×2): 81 mg via ORAL
  Filled 2015-09-05 (×2): qty 1

## 2015-09-05 MED ORDER — ACETAMINOPHEN 325 MG PO TABS: 650.0000 mg | ORAL_TABLET | Freq: Four times a day (QID) | ORAL | Status: DC | PRN

## 2015-09-05 MED ORDER — VITAMIN D 1000 UNITS PO TABS
1000.0000 [IU] | ORAL_TABLET | Freq: Every day | ORAL | Status: DC
  Administered 2015-09-06 – 2015-09-07 (×2): 1000 [IU] via ORAL
  Filled 2015-09-05 (×2): qty 1

## 2015-09-05 MED ORDER — GABAPENTIN 300 MG PO CAPS
300.0000 mg | ORAL_CAPSULE | Freq: Three times a day (TID) | ORAL | Status: DC
  Administered 2015-09-05 – 2015-09-07 (×5): 300 mg via ORAL
  Filled 2015-09-05 (×5): qty 1

## 2015-09-05 MED ORDER — SODIUM CHLORIDE 0.9 % IV BOLUS (SEPSIS)
1000.0000 mL | Freq: Once | INTRAVENOUS | Status: AC
  Administered 2015-09-05: 1000 mL via INTRAVENOUS

## 2015-09-05 MED ORDER — SODIUM CHLORIDE 0.9 % IV SOLN
INTRAVENOUS | Status: DC
  Administered 2015-09-05: via INTRAVENOUS

## 2015-09-05 MED ORDER — ONDANSETRON HCL 4 MG/2ML IJ SOLN: 4.0000 mg | Freq: Four times a day (QID) | INTRAMUSCULAR | Status: DC | PRN

## 2015-09-05 MED ORDER — MORPHINE SULFATE (PF) 2 MG/ML IV SOLN
1.0000 mg | INTRAVENOUS | Status: DC | PRN
  Administered 2015-09-05 – 2015-09-07 (×9): 1 mg via INTRAVENOUS
  Filled 2015-09-05 (×10): qty 1

## 2015-09-05 MED ORDER — FUROSEMIDE 40 MG PO TABS
40.0000 mg | ORAL_TABLET | Freq: Every day | ORAL | Status: DC | PRN
Start: 2015-09-05 — End: 2015-09-06

## 2015-09-05 MED ORDER — VITAMIN B-12 1000 MCG PO TABS
1000.0000 ug | ORAL_TABLET | Freq: Every day | ORAL | Status: DC
  Administered 2015-09-06 – 2015-09-07 (×2): 1000 ug via ORAL
  Filled 2015-09-05 (×2): qty 1

## 2015-09-05 MED ORDER — LEVOTHYROXINE SODIUM 88 MCG PO TABS
88.0000 ug | ORAL_TABLET | Freq: Every day | ORAL | Status: DC
  Administered 2015-09-06 – 2015-09-07 (×2): 88 ug via ORAL
  Filled 2015-09-05 (×2): qty 1

## 2015-09-05 MED ORDER — PANTOPRAZOLE SODIUM 40 MG PO TBEC
40.0000 mg | DELAYED_RELEASE_TABLET | Freq: Every day | ORAL | Status: DC
  Administered 2015-09-06 – 2015-09-07 (×2): 40 mg via ORAL
  Filled 2015-09-05 (×2): qty 1

## 2015-09-05 MED ORDER — ONDANSETRON HCL 4 MG PO TABS: 4.0000 mg | ORAL_TABLET | Freq: Four times a day (QID) | ORAL | Status: DC | PRN

## 2015-09-05 MED ORDER — DOCUSATE SODIUM 100 MG PO CAPS
100.0000 mg | ORAL_CAPSULE | Freq: Two times a day (BID) | ORAL | Status: DC
  Administered 2015-09-05 – 2015-09-07 (×4): 100 mg via ORAL
  Filled 2015-09-05 (×5): qty 1
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
## 2015-09-05 NOTE — Assessment & Plan Note (Signed)
Cannot treat outpatient due to orthostasis. Can't get into see cardiology until Thursday. Will send to ER for evaluation and treatment. Call over to [facility] to let them know she's coming. Follow up here after hospitalization.

## 2015-09-05 NOTE — ED Notes (Signed)
Pt to ed with c/o hypotension, sent from mds office for increased swelling in bilat feet and ankles x 1 week.

## 2015-09-05 NOTE — Progress Notes (Signed)
BP 106/67 mmHg  Pulse 101  Temp(Src) 98.3 F (36.8 C)  Wt 139 lb 2 oz (63.107 kg)  SpO2 95%   Subjective:    Patient ID: [name], female    DOB: 02-18-1951, 65 y.o.   MRN: [id number]  HPI: [name] is a 65 y.o. female  Chief Complaint  Patient presents with  . Leg Swelling  . Dizziness    Patient states that she has been very dizzy, she states that she has a difficult time walking. Patient states that she has not taken her enalipril in several months   DIZZINESS Duration: Several weeks, around the time she had her infusions for her iron Description of symptoms: lightheaded Duration of episode: hours Dizziness frequency: recurrent Provoking factors: none Aggravating factors:  none Triggered by rolling over in bed: no Triggered by bending over: no Aggravated by head movement: no Aggravated by exertion, coughing, loud noises: no Recent head injury: no Recent or current viral symptoms: no History of vasovagal episodes: no Nausea: yes Vomiting: no Tinnitus: no Hearing loss: no Aural fullness: no Headache: no Photophobia/phonophobia: no Unsteady gait: yes Postural instability: yes Diplopia, dysarthria, dysphagia or weakness: no Related to exertion: no Pallor: yes Diaphoresis: no Dyspnea: no Chest pain: no  Relevant past medical, surgical, family and social history reviewed and updated as indicated. Interim medical history since our last visit reviewed. Allergies and medications reviewed and updated.  Review of Systems  Constitutional: Negative.   Respiratory: Positive for shortness of breath. Negative for apnea, cough, choking, chest tightness, wheezing and stridor.   Cardiovascular: Positive for leg swelling. Negative for chest pain and palpitations.  Psychiatric/Behavioral: Negative.     Per HPI unless specifically indicated above     Objective:    BP 106/67 mmHg  Pulse 101  Temp(Src) 98.3 F (36.8 C)  Wt 139 lb 2 oz (63.107 kg)  SpO2  95%  Wt Readings from Last 3 Encounters:  09/05/15 139 lb 2 oz (63.107 kg)  08/10/15 136 lb 14.5 oz (62.1 kg)  08/04/15 135 lb (61.236 kg)    Orthostatic VS for the past 24 hrs:  BP- Lying Pulse- Lying BP- Sitting Pulse- Sitting BP- Standing at 0 minutes Pulse- Standing at 0 minutes  09/05/15 1026 147/71 mmHg 98 112/67 mmHg 96 (!) 88/48 mmHg 103    Physical Exam  Constitutional: She is oriented to person, place, and time. She appears well-developed and well-nourished. No distress.  HENT:  Head: Normocephalic and atraumatic.  Right Ear: Hearing normal.  Left Ear: Hearing normal.  Nose: Nose normal.  Eyes: Conjunctivae and lids are normal. Right eye exhibits no discharge. Left eye exhibits no discharge. No scleral icterus.  Cardiovascular: Normal rate, regular rhythm and intact distal pulses.  Exam reveals no gallop and no friction rub.   Murmur heard. Pulmonary/Chest: Effort normal. No respiratory distress. She has no wheezes. She has rales. She exhibits no tenderness.  Musculoskeletal: Normal range of motion. She exhibits edema (3+ edema bilaterally).  Neurological: She is alert and oriented to person, place, and time.  Skin: Skin is warm, dry and intact. No rash noted. No erythema. No pallor.  Psychiatric: She has a normal mood and affect. Her speech is normal and behavior is normal. Judgment and thought content normal. Cognition and memory are normal.  Nursing note and vitals reviewed.   Results for orders placed or performed in visit on 08/30/15  TSH  Result Value Ref Range   TSH 0.087 (L) 0.450 - 4.500  uIU/mL      Assessment & Plan:   Problem List Items Addressed This Visit      Cardiovascular and Mediastinum   Acute on chronic systolic CHF (congestive heart failure) ([place]) - Primary    Cannot treat outpatient due to orthostasis. Can't get into see cardiology until Thursday. Will send to ER for evaluation and treatment. Call over to [facility] to let them know she's coming.  Follow up here after hospitalization.       Relevant Orders   Ambulatory referral to Cardiology    Other Visit Diagnoses    Orthostatic hypotension        Not really able to remove any more drugs. Can't treat CHF right now outpatient due to orthostasis. To ER. Call made. Follow up here and with cards after.         Follow up plan: Return for Pending ER/Hospital visit.

## 2015-09-05 NOTE — ED Notes (Signed)
Patient transported to Ultrasound 

## 2015-09-06 ENCOUNTER — Encounter: Payer: Self-pay | Admitting: Physician Assistant

## 2015-09-06 ENCOUNTER — Inpatient Hospital Stay (HOSPITAL_COMMUNITY)
Admit: 2015-09-06 | Discharge: 2015-09-06 | Disposition: A | Discharge status: Home / Self Care | Payer: Medicare Other | Attending: Internal Medicine | Admitting: Internal Medicine

## 2015-09-06 ENCOUNTER — Inpatient Hospital Stay: Payer: Medicare Other

## 2015-09-06 DIAGNOSIS — I5042 Chronic combined systolic (congestive) and diastolic (congestive) heart failure: Secondary | ICD-10-CM

## 2015-09-06 DIAGNOSIS — R600 Localized edema: Secondary | ICD-10-CM

## 2015-09-06 DIAGNOSIS — R0600 Dyspnea, unspecified: Secondary | ICD-10-CM

## 2015-09-06 DIAGNOSIS — L03115 Cellulitis of right lower limb: Secondary | ICD-10-CM | POA: Diagnosis present

## 2015-09-06 DIAGNOSIS — I429 Cardiomyopathy, unspecified: Secondary | ICD-10-CM

## 2015-09-06 DIAGNOSIS — E860 Dehydration: Secondary | ICD-10-CM

## 2015-09-06 DIAGNOSIS — L03116 Cellulitis of left lower limb: Secondary | ICD-10-CM

## 2015-09-06 DIAGNOSIS — I951 Orthostatic hypotension: Secondary | ICD-10-CM

## 2015-09-06 DIAGNOSIS — I428 Other cardiomyopathies: Secondary | ICD-10-CM | POA: Insufficient documentation

## 2015-09-06 DIAGNOSIS — N179 Acute kidney failure, unspecified: Principal | ICD-10-CM

## 2015-09-06 LAB — T4, FREE: Free T4: 1.12 ng/dL (ref 0.61–1.12)

## 2015-09-06 LAB — ECHOCARDIOGRAM COMPLETE
HEIGHTINCHES: 65 in
Weight: 2299.2 oz

## 2015-09-06 LAB — BRAIN NATRIURETIC PEPTIDE: B Natriuretic Peptide: 902 pg/mL — ABNORMAL HIGH (ref 0.0–100.0)

## 2015-09-06 LAB — HEMOGLOBIN A1C: Hgb A1c MFr Bld: 4.5 % (ref 4.0–6.0)

## 2015-09-06 MED ORDER — SODIUM CHLORIDE 0.9 % IV SOLN: INTRAVENOUS | Status: DC

## 2015-09-06 MED ORDER — FUROSEMIDE 10 MG/ML IJ SOLN
20.0000 mg | Freq: Two times a day (BID) | INTRAMUSCULAR | Status: DC
  Administered 2015-09-06 – 2015-09-07 (×2): 20 mg via INTRAVENOUS
  Filled 2015-09-06 (×2): qty 2

## 2015-09-06 MED ORDER — CARVEDILOL 3.125 MG PO TABS
3.1250 mg | ORAL_TABLET | Freq: Two times a day (BID) | ORAL | Status: DC
  Administered 2015-09-06 – 2015-09-07 (×2): 3.125 mg via ORAL
  Filled 2015-09-06 (×3): qty 1

## 2015-09-06 MED ORDER — TECHNETIUM TC 99M DIETHYLENETRIAME-PENTAACETIC ACID
29.9090 | Freq: Once | INTRAVENOUS | Status: AC | PRN
  Administered 2015-09-06: 14:00:00 29.909 via INTRAVENOUS

## 2015-09-06 MED ORDER — TRAMADOL-ACETAMINOPHEN 37.5-325 MG PO TABS
1.0000 | ORAL_TABLET | Freq: Four times a day (QID) | ORAL | Status: DC | PRN
  Filled 2015-09-06: qty 1

## 2015-09-06 MED ORDER — TECHNETIUM TO 99M ALBUMIN AGGREGATED
4.1890 | Freq: Once | INTRAVENOUS | Status: AC | PRN
  Administered 2015-09-06: 14:00:00 4.189 via INTRAVENOUS

## 2015-09-06 MED ORDER — CEPHALEXIN 500 MG PO CAPS
500.0000 mg | ORAL_CAPSULE | Freq: Three times a day (TID) | ORAL | Status: DC
  Administered 2015-09-06 – 2015-09-07 (×4): 500 mg via ORAL
  Filled 2015-09-06 (×5): qty 1

## 2015-09-06 NOTE — Care Management (Signed)
Presented to [facility] with the diagnosis of acute kidney injury. Lives alone. Sister is Lelan Pons [phone]). Mother is Alden Benjamin [phone]). Last seen Dr. Park Liter at [facility] yesterday. [facility] x 3 months 2014. [facility] and [facility] in the past. Home oxygen through [facility] since November 2014. States she uses the oxygen when needed. Takes care of both basic and instrumental activities of daily living herself, drives. Gets [employer]. Will be going to [facility] when discharged. Shelbie Ammons RN MSN CCM Care Management [phone]
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
## 2015-09-06 NOTE — Progress Notes (Signed)
Pt transported to echo 

## 2015-09-06 NOTE — Progress Notes (Signed)
*  PRELIMINARY RESULTS* Echocardiogram 2D Echocardiogram has been performed.  [name] 09/06/2015, 9:44 AM

## 2015-09-06 NOTE — Progress Notes (Signed)
[facility] NAME: [name]    MR#:  [id number]  DATE OF BIRTH:  [birth year]  SUBJECTIVE:  CHIEF COMPLAINT:   Chief Complaint  Patient presents with  . Leg Swelling   The patient is 65 year old Caucasian female with past medical history significant for history of nonischemic cardiomyopathy, chronic systolic CHF, hypertension, hyperlipidemia, hypothyroidism, spinal stenosis, chronic back pain who presents to the hospital with lower extremity swelling, fatigue. On arrival to emergency room, patient's labs revealed a mild renal insufficiency, however, patient had significant pain and swelling in her lower extremities and was admitted. Patient complains of some shortness of breath, chronic, lower extremity swelling, erythema in lower extremities. Review of Systems  Constitutional: Negative for fever, chills and weight loss.  HENT: Negative for congestion.   Eyes: Negative for blurred vision and double vision.  Respiratory: Positive for shortness of breath. Negative for cough, sputum production and wheezing.   Cardiovascular: Positive for leg swelling. Negative for chest pain, palpitations, orthopnea and PND.  Gastrointestinal: Negative for nausea, vomiting, abdominal pain, diarrhea, constipation and blood in stool.  Genitourinary: Negative for dysuria, urgency, frequency and hematuria.  Musculoskeletal: Negative for falls.  Skin: Positive for rash.  Neurological: Negative for dizziness, tremors, focal weakness and headaches.  Endo/Heme/Allergies: Does not bruise/bleed easily.  Psychiatric/Behavioral: Negative for depression. The patient does not have insomnia.     VITAL SIGNS: Blood pressure 132/87, pulse 92, temperature 99.1 F (37.3 C), temperature source Oral, resp. rate 16, height 5\' 5"  (1.651 m), weight 65.182 kg (143 lb 11.2 oz), SpO2 94 %.  PHYSICAL EXAMINATION:   GENERAL:  65 y.o.-year-old patient lying in the bed  with no acute distress.  EYES: Pupils equal, round, reactive to light and accommodation. No scleral icterus. Extraocular muscles intact.  HEENT: Head atraumatic, normocephalic. Oropharynx and nasopharynx clear.  NECK:  Supple, no jugular venous distention. No thyroid enlargement, no tenderness.  LUNGS: Some diminished breath sounds bilaterally, no wheezing, but bilateral basilar rales,rhonchi and crepitations noted. No use of accessory muscles of respiration.  CARDIOVASCULAR: S1, S2 normal. No murmurs, rubs, or gallops.  ABDOMEN: Soft, nontender, nondistended. Bowel sounds present. No organomegaly or mass.  EXTREMITIES: 2+ lower extremity and pedal edema, bilaterally. Lower extremity erythema up to the knees and increased warmth as well as painful palpation, no cyanosis, or clubbing.  NEUROLOGIC: Cranial nerves II through XII are intact. Muscle strength 5/5 in all extremities. Sensation intact. Gait not checked.  PSYCHIATRIC: The patient is alert and oriented x 3.  SKIN: No obvious rash, lesion, or ulcer.   ORDERS/RESULTS REVIEWED:   CBC  Recent Labs Lab 09/05/15 1355  WBC 6.8  HGB 10.5*  HCT 33.6*  PLT 461*  MCV 83.1  MCH 25.9*  MCHC 31.2*  RDW 30.0*  LYMPHSABS 1.3  MONOABS 0.5  EOSABS 0.2  BASOSABS 0.1   ------------------------------------------------------------------------------------------------------------------  Chemistries   Recent Labs Lab 09/05/15 1355  NA 141  K 3.9  CL 106  CO2 27  GLUCOSE 96  BUN 27*  CREATININE 1.36*  CALCIUM 8.2*   ------------------------------------------------------------------------------------------------------------------ estimated creatinine clearance is 37.1 mL/min (by C-G formula based on Cr of 1.36). ------------------------------------------------------------------------------------------------------------------  Recent Labs  09/05/15 1355  TSH 0.099*    Cardiac Enzymes  Recent Labs Lab 09/05/15 1355  TROPONINI  0.03   ------------------------------------------------------------------------------------------------------------------ Invalid input(s): POCBNP ---------------------------------------------------------------------------------------------------------------  RADIOLOGY: US Venous Img Lower Bilateral  09/05/2015  CLINICAL DATA:  Bilateral lower extremity erythema/edema EXAM: BILATERAL LOWER EXTREMITY  VENOUS DOPPLER ULTRASOUND TECHNIQUE: Gray-scale sonography with graded compression, as well as color Doppler and duplex ultrasound were performed to evaluate the lower extremity deep venous systems from the level of the common femoral vein and including the common femoral, femoral, profunda femoral, popliteal and calf veins including the posterior tibial, peroneal and gastrocnemius veins when visible. The superficial great saphenous vein was also interrogated. Spectral Doppler was utilized to evaluate flow at rest and with distal augmentation maneuvers in the common femoral, femoral and popliteal veins. COMPARISON:  10/11/2009 FINDINGS: RIGHT LOWER EXTREMITY Common Femoral Vein: No evidence of thrombus. Normal compressibility, respiratory phasicity and response to augmentation. Saphenofemoral Junction: No evidence of thrombus. Normal compressibility and flow on color Doppler imaging. Profunda Femoral Vein: No evidence of thrombus. Normal compressibility and flow on color Doppler imaging. Femoral Vein: No evidence of thrombus. Normal compressibility, respiratory phasicity and response to augmentation. Popliteal Vein: No evidence of thrombus. Normal compressibility, respiratory phasicity and response to augmentation. Calf Veins: No evidence of thrombus. Normal compressibility and flow on color Doppler imaging. Superficial Great Saphenous Vein: No evidence of thrombus. Normal compressibility and flow on color Doppler imaging. Venous Reflux:  None. Other Findings: Limited visualization of peroneal vein due to  subcutaneous edema. LEFT LOWER EXTREMITY Common Femoral Vein: No evidence of thrombus. Normal compressibility, respiratory phasicity and response to augmentation. Saphenofemoral Junction: No evidence of thrombus. Normal compressibility and flow on color Doppler imaging. Profunda Femoral Vein: No evidence of thrombus. Normal compressibility and flow on color Doppler imaging. Femoral Vein: No evidence of thrombus. Normal compressibility, respiratory phasicity and response to augmentation. Popliteal Vein: No evidence of thrombus. Normal compressibility, respiratory phasicity and response to augmentation. Calf Veins: No evidence of thrombus. Normal compressibility and flow on color Doppler imaging. Superficial Great Saphenous Vein: No evidence of thrombus. Normal compressibility and flow on color Doppler imaging. Venous Reflux:  None. Other Findings: Limited visualization of peroneal vein due to subcutaneous edema. IMPRESSION: No evidence of deep venous thrombosis bilateral lower extremity. Limited visualization and assessment bilateral peroneal vein due to subcutaneous edema . Electronically Signed   By: Lahoma Crocker M.D.   On: 09/05/2015 15:16   Dg Chest Portable 1 View  09/05/2015  CLINICAL DATA:  Pt to ed with c/o hypotension, sent from Physicians office for increased swelling in bilat feet and ankles x 1 week. , slight chest pain, slight sob EXAM: PORTABLE CHEST 1 VIEW COMPARISON:  03/28/2014 FINDINGS: The heart size and mediastinal contours are within normal limits. Both lungs are clear. The visualized skeletal structures are unremarkable. IMPRESSION: No active disease. Electronically Signed   By: Nolon Nations M.D.   On: 09/05/2015 14:01    EKG:  Orders placed or performed during the hospital encounter of 09/05/15  . ED EKG  . ED EKG  . EKG 12-Lead  . EKG 12-Lead    ASSESSMENT AND PLAN:  Principal Problem:   AKI (acute kidney injury) ([place]) Active Problems:   NICM (nonischemic cardiomyopathy)  ([place])   Acute on chronic combined systolic and diastolic congestive heart failure (HCC)   Coronary artery disease, non-occlusive   Chronic combined systolic and diastolic CHF (congestive heart failure) ([place]) #1. Bilateral lower extremity cellulitis, initiate patient on Keflex orally, follow clinically #2. Acute on chronic systolic CHF, continue patient on Coreg, Lasix, unable to use ACE inhibitor or ARB due to renal insufficiency, active diuresis. Echocardiogram is pending, performed #3. Acute renal insufficiency, followed with diuresis closely #4. Bilateral Lower extremity swelling, no DVT on Doppler ultrasound #  5. Urinary tract infection, get urinary cultures, continue Keflex, adjust antibiotics depending on culture results #6. Sick euthyroid, TSH is low, however, free T4 is normal, no intervention  Management plans discussed with the patient, family and they are in agreement.   DRUG ALLERGIES:  Allergies  Allergen Reactions  . Codeine Hives  . Macrobid [Nitrofurantoin Monohyd Macro] Diarrhea    CODE STATUS:     Code Status Orders        Start     Ordered   09/05/15 2248  Full code   Continuous     09/05/15 2247    Code Status History    Date Active Date Inactive Code Status Order ID Comments User Context   04/06/2014  3:14 PM 04/10/2014  4:58 PM Full Code [id number]  Wellington Hampshire, MD Inpatient      TOTAL TIME TAKING CARE OF THIS PATIENT: 40 minutes.    Theodoro Grist M.D on 09/06/2015 at 2:25 PM  Between 7am to 6pm - Pager - [phone]  After 6pm go to [email or web address] - password [email or web address] [facility]  Office  [phone]  CC: Primary care physician; Park Liter, DO
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
## 2015-09-07 ENCOUNTER — Ambulatory Visit: Payer: Medicare Other | Admitting: Physician Assistant

## 2015-09-07 DIAGNOSIS — N390 Urinary tract infection, site not specified: Secondary | ICD-10-CM

## 2015-09-07 LAB — BASIC METABOLIC PANEL
Anion gap: 5 (ref 5–15)
BUN: 10 mg/dL (ref 6–20)
CALCIUM: 8 mg/dL — AB (ref 8.9–10.3)
CO2: 27 mmol/L (ref 22–32)
CREATININE: 0.66 mg/dL (ref 0.44–1.00)
Chloride: 108 mmol/L (ref 101–111)
GFR calc Af Amer: >60 mL/min (ref >60)
GLUCOSE: 89 mg/dL (ref 65–99)
Potassium: 3.7 mmol/L (ref 3.5–5.1)
Sodium: 140 mmol/L (ref 135–145)

## 2015-09-07 LAB — URINE CULTURE

## 2015-09-07 MED ORDER — CEPHALEXIN 500 MG PO CAPS: 500.0000 mg | ORAL_CAPSULE | Freq: Three times a day (TID) | ORAL | Status: DC

## 2015-09-07 MED ORDER — DOCUSATE SODIUM 100 MG PO CAPS: 100.0000 mg | ORAL_CAPSULE | Freq: Two times a day (BID) | ORAL | Status: DC

## 2015-09-07 NOTE — Progress Notes (Signed)
   Unable to round on patient today. She was discharged before cardiology could see her.

## 2015-09-07 NOTE — Discharge Summary (Signed)
[facility] NAME: [name]    MR#:  [id number]  DATE OF BIRTH:  [birth year]  DATE OF ADMISSION:  09/06/2015 ADMITTING PHYSICIAN: No admitting provider for patient encounter.  DATE OF DISCHARGE: 09/06/2015 11:59 PM  PRIMARY CARE PHYSICIAN: Park Liter, DO     ADMISSION DIAGNOSIS:  There are no admission diagnoses documented for this encounter.  DISCHARGE DIAGNOSIS:  Bilateral lower extremity cellulitis Lower extremity swelling and pain Peripheral vascular disease, suspected venous insufficiency Acute on chronic diastolic congestive heart failure Acute renal insufficiency, resolved Pyuria, suspected urinary tract infection, culture is pending   SECONDARY DIAGNOSIS:   Past Medical History  Diagnosis Date  . NICM (nonischemic cardiomyopathy) ([place])     a. 2008 Echo: EF 20% [facility]);  b. 2011 Echo: EF 50-55% ([facility]);  c. 02/2014 Echo: EF 20-25%, mod dil LA/RA, severe MR, mod-sev TR. d. cath 03/15/2014: minor lumenal irregs EF 20%  . Chronic systolic CHF (congestive heart failure) ([place])     a. 02/2014 Echo: EF 20-25%, severe MR, tricuspid regurg, mod dilated LA & RA  . HTN (hypertension)   . Hyperlipidemia   . Hypothyroidism     a. 02/2014 TSH 96.4.  . Spinal stenosis   . Chronic back pain   . PUD (peptic ulcer disease)   . History of nuclear stress test     a. 02/24/2014: Lexiscan Myoview: no sig ischemia, On attenuation corrected images small mild perfusion defect in the apical & distal anterior wall w/ possible small mild ischemia. Mod global HK. EF 35%. Overall, moderate risk study.  b. cath 03/17/2014 with no sig CAD  . Myocardial infarction [facility]) 2007; 2008; 03/2014  . PAD (peripheral artery disease) ([place]) 04/06/2014    Successful self-expanding stent placement to the left external iliac artery and right common iliac arteries, med rx for L SFA  dz.  . Pneumonia "several times"  . Anemia   . History of blood  transfusion >50 times    "had blood transfusion; never found out what"  . Arthritis     right hip; back  . Chronic lower back pain   . On home oxygen therapy     prn  . Depression   . Collagen vascular disease ([place])   . GERD (gastroesophageal reflux disease)   . Chronic right hip pain     .pro HOSPITAL COURSE:  The patient is 65 year old Caucasian female with past medical history significant for history of nonischemic cardiomyopathy, chronic combined systolic and diastolic CHF, hypertension, hyperlipidemia, hypothyroidism, spinal stenosis, chronic back pain who presented to the hospital with lower extremity swelling, fatigue. On arrival to emergency room, patient's labs revealed a mild renal insufficiency, however, patient had significant pain and swelling in her lower extremities and was admitted. Upon admission she was noted to have erythema and lower extremities bilaterally, increased warmth and tenderness on palpation, concerning for bilateral acute lower extremity cellulitis. Patient was initiated on antibiotics orally and her condition improved, redness, increased warmth subsided. Patient's swelling subsided with Lasix. Patient was seen by cardiologist, follow patient along, and echocardiogram was performed revealing normal ejection fraction, LVH, no significant valvular abnormalities. VQ scan was also performed which showed no pulmonary embolism, ultrasound of lower extremities bilaterally revealed no DVT. Patient was managed on antibiotics, Lasix and he her condition improved. She was felt to be stable to be discharged home with cardiology follow-up as outpatient. Since she had diminished pedal pulses, vascular consultation is also be  going to be arranged upon discharge. Discussion by problem #1. Bilateral lower extremity cellulitis, continue patient on Keflex orally for 7 more days to complete course, follow clinically, follow-up with primary care physician as outpatient. Patient does have  lower extremity pain, she was advised to follow up with vascular surgery to rule out peripheral vascular disease, venous insufficiency contributing to lower extremity swelling and pain.  #2. Acute on chronic diastolic CHF, echocardiogram which was done during this admission revealed normal ejection fraction, LVH, no significant valvular abnormalities, no regional wall motion abnormalities. Patient was seen by cardiologist, she is recommended to follow up with cardiologist as outpatient, continue patient on Coreg, Lasix, we did not initiate patient on ACE inhibitor or ARB due to recent acute renal insufficiency, active diuresis. Patient may benefit, however, from ARB or ACE inhibitor initiation as outpatient, she is to follow up with cardiologist as outpatient. Patient is being discharged on Lasix at 40 mg once daily dose.  #3. Acute renal insufficiency, resolved with diuresis closely #4. Bilateral Lower extremity swelling, no DVT on Doppler ultrasound, very likely due to CHF, rule out venous insufficiency, patient was advised to place compression stockings daily, follow-up with Dr. Lucky Cowboy, cardiology as outpatient #5. Urinary tract infection, repeating urinary cultures, as initial cultures revealed numerous organism, continue Keflex, adjust antibiotics depending on culture results, if symptomatic   DISCHARGE CONDITIONS:   Stable  CONSULTS OBTAINED:     DRUG ALLERGIES:   Allergies  Allergen Reactions  . Codeine Hives  . Macrobid [Nitrofurantoin Monohyd Macro] Diarrhea    DISCHARGE MEDICATIONS:  Cannot display discharge medications since this is not an admission.    DISCHARGE INSTRUCTIONS:    Patient is to follow-up with primary care physician, cardiologist, vascular surgeon as outpatient  If you experience worsening of your admission symptoms, develop shortness of breath, life threatening emergency, suicidal or homicidal thoughts you must seek medical attention immediately by calling 911 or  calling your MD immediately  if symptoms less severe.  You Must read complete instructions/literature along with all the possible adverse reactions/side effects for all the Medicines you take and that have been prescribed to you. Take any new Medicines after you have completely understood and accept all the possible adverse reactions/side effects.   Please note  You were cared for by a hospitalist during your hospital stay. If you have any questions about your discharge medications or the care you received while you were in the hospital after you are discharged, you can call the unit and asked to speak with the hospitalist on call if the hospitalist that took care of you is not available. Once you are discharged, your primary care physician will handle any further medical issues. Please note that NO REFILLS for any discharge medications will be authorized once you are discharged, as it is imperative that you return to your primary care physician (or establish a relationship with a primary care physician if you do not have one) for your aftercare needs so that they can reassess your need for medications and monitor your lab values.    Today   CHIEF COMPLAINT:  No chief complaint on file.   HISTORY OF PRESENT ILLNESS:  [name]  is a 65 y.o. female with a known history of nonischemic cardiomyopathy, chronic combined systolic and diastolic CHF, hypertension, hyperlipidemia, hypothyroidism, spinal stenosis, chronic back pain who presented to the hospital with lower extremity swelling, fatigue. On arrival to emergency room, patient's labs revealed a mild renal insufficiency, however, patient had  significant pain and swelling in her lower extremities and was admitted. Upon admission she was noted to have erythema and lower extremities bilaterally, increased warmth and tenderness on palpation, concerning for bilateral acute lower extremity cellulitis. Patient was initiated on antibiotics orally and  her condition improved, redness, increased warmth subsided. Patient's swelling subsided with Lasix. Patient was seen by cardiologist, follow patient along, and echocardiogram was performed revealing normal ejection fraction, LVH, no significant valvular abnormalities. VQ scan was also performed which showed no pulmonary embolism, ultrasound of lower extremities bilaterally revealed no DVT. Patient was managed on antibiotics, Lasix and he her condition improved. She was felt to be stable to be discharged home with cardiology follow-up as outpatient. Since she had diminished pedal pulses, vascular consultation is also be going to be arranged upon discharge. Discussion by problem #1. Bilateral lower extremity cellulitis, continue patient on Keflex orally for 7 more days to complete course, follow clinically, follow-up with primary care physician as outpatient. Patient does have lower extremity pain, she was advised to follow up with vascular surgery to rule out peripheral vascular disease, venous insufficiency contributing to lower extremity swelling and pain.  #2. Acute on chronic diastolic CHF, echocardiogram which was done during this admission revealed normal ejection fraction, LVH, no significant valvular abnormalities, no regional wall motion abnormalities. Patient was seen by cardiologist, she is recommended to follow up with cardiologist as outpatient, continue patient on Coreg, Lasix, we did not initiate patient on ACE inhibitor or ARB due to recent acute renal insufficiency, active diuresis. Patient may benefit, however, from ARB or ACE inhibitor initiation as outpatient, she is to follow up with cardiologist as outpatient. Patient is being discharged on Lasix at 40 mg once daily dose.  #3. Acute renal insufficiency, resolved with diuresis closely #4. Bilateral Lower extremity swelling, no DVT on Doppler ultrasound, very likely due to CHF, rule out venous insufficiency, patient was advised to place  compression stockings daily, follow-up with Dr. Lucky Cowboy, cardiology as outpatient #5. Urinary tract infection, repeating urinary cultures, as initial cultures revealed numerous organism, continue Keflex, adjust antibiotics depending on culture results, if symptomatic     VITAL SIGNS:  There were no vitals taken for this visit.  I/O:   Intake/Output Summary (Last 24 hours) at 09/07/15 1556 Last data filed at 09/07/15 1125  Gross per 24 hour  Intake    240 ml  Output    900 ml  Net   -660 ml    PHYSICAL EXAMINATION:  GENERAL:  65 y.o.-year-old patient lying in the bed with no acute distress.  EYES: Pupils equal, round, reactive to light and accommodation. No scleral icterus. Extraocular muscles intact.  HEENT: Head atraumatic, normocephalic. Oropharynx and nasopharynx clear.  NECK:  Supple, no jugular venous distention. No thyroid enlargement, no tenderness.  LUNGS: Normal breath sounds bilaterally, no wheezing, rales,rhonchi or crepitation. No use of accessory muscles of respiration.  CARDIOVASCULAR: S1, S2 normal. No murmurs, rubs, or gallops.  ABDOMEN: Soft, non-tender, non-distended. Bowel sounds present. No organomegaly or mass.  EXTREMITIES:2+ lower extremity andl edema, . No cyanosis, or clubbing. Erythema, increased warmth, has subsided as compared to yesterday.  NEUROLOGIC: Cranial nerves II through XII are intact. Muscle strength 5/5 in all extremities. Sensation intact. Gait not checked.  PSYCHIATRIC: The patient is alert and oriented x 3.  SKIN: No obvious rash, lesion, or ulcer.   DATA REVIEW:   CBC  Recent Labs Lab 09/05/15 1355  WBC 6.8  HGB 10.5*  HCT 33.6*  PLT  461*    Chemistries   Recent Labs Lab 09/07/15 0401  NA 140  K 3.7  CL 108  CO2 27  GLUCOSE 89  BUN 10  CREATININE 0.66  CALCIUM 8.0*    Cardiac Enzymes  Recent Labs Lab 09/05/15 1355  TROPONINI 0.03    Microbiology Results  Results for orders placed or performed in visit on  08/04/15  Microscopic Examination     Status: Abnormal   Collection Time: 08/04/15  2:08 PM  Result Value Ref Range Status   WBC, UA 0-5 0 -  5 /hpf Final   RBC, UA 0-2 0 -  2 /hpf Final   Epithelial Cells (non renal) 0-10 0 - 10 /hpf Final   Casts Present (A) None seen /lpf Final   Cast Type Hyaline casts N/A Final   Mucus, UA Present Not Estab. Final   Bacteria, UA Few None seen/Few Final    RADIOLOGY:  Nm Pulmonary Perf And Vent  09/06/2015  CLINICAL DATA:  Pneumonia.  Hypoxia. EXAM: NUCLEAR MEDICINE VENTILATION - PERFUSION LUNG SCAN TECHNIQUE: Ventilation images were obtained in multiple projections using inhaled aerosol Tc-56m DTPA. Perfusion images were obtained in multiple projections after intravenous injection of Tc-79m MAA. RADIOPHARMACEUTICALS:  Twenty-nine mCi Technetium-88m DTPA aerosol inhalation and 4.2 mCi Technetium-73m MAA IV COMPARISON:  Chest radiograph 09/05/2015. FINDINGS: Ventilation: No focal ventilation defect. Perfusion: No wedge shaped peripheral perfusion defects to suggest acute pulmonary embolism. IMPRESSION: No evidence of pulmonary embolism. Electronically Signed   By: Suzy Bouchard M.D.   On: 09/06/2015 16:08    EKG:   Orders placed or performed in visit on 04/03/15  . EKG 12-Lead      Management plans discussed with the patient, family and they are in agreement.  CODE STATUS:  Code Status History    Date Active Date Inactive Code Status Order ID Comments User Context   09/05/2015 10:47 PM  Full Code [id number]  Harrie Foreman, MD Inpatient   04/06/2014  3:14 PM 04/10/2014  4:58 PM Full Code [id number]  Wellington Hampshire, MD Inpatient      TOTAL TIME TAKING CARE OF THIS PATIENT: 40  minutes.    Theodoro Grist M.D on 09/07/2015 at 3:56 PM  Between 7am to 6pm - Pager - [phone]  After 6pm go to [email or web address] - password [email or web address] [place] [facility]  Office  [phone]  CC: Primary care physician; Park Liter, DO

## 2015-09-07 NOTE — Care Management Important Message (Signed)
Important Message  Patient Details  Name: [name] MRN: [id number] Date of Birth: August 25, 1950   Medicare Important Message Given:  Yes    [name] 09/07/2015, 12:02 PM

## 2015-09-07 NOTE — Progress Notes (Signed)
Notified Dr Ether Griffins that pt is being discharged and need clarification if urine culture needs to be recollected. Per MD to recollect urine culture and d/c pt.

## 2015-09-07 NOTE — Progress Notes (Signed)
Pt is being discharged. Discharge instructions given and explained to pt. Pt verbalized understanding. Meds and f/u appointments reviewed with pt. Pt to pick up RX from pharmacy.

## 2015-09-09 LAB — URINE CULTURE
Culture: 1000 — AB
Special Requests: NORMAL

## 2015-09-13 ENCOUNTER — Other Ambulatory Visit: Payer: Self-pay | Admitting: Family Medicine

## 2015-09-13 MED ORDER — HYDROCODONE-ACETAMINOPHEN 10-325 MG PO TABS: 1.0000 | ORAL_TABLET | Freq: Three times a day (TID) | ORAL | Status: DC | PRN

## 2015-09-21 ENCOUNTER — Encounter: Payer: Self-pay | Admitting: Family Medicine

## 2015-09-21 ENCOUNTER — Telehealth: Payer: Self-pay | Admitting: Cardiovascular Disease

## 2015-09-21 ENCOUNTER — Ambulatory Visit (INDEPENDENT_AMBULATORY_CARE_PROVIDER_SITE_OTHER): Payer: Medicare Other | Admitting: Family Medicine

## 2015-09-21 VITALS — BP 121/68 | HR 102 | Temp 98.7°F | Wt 138.0 lb

## 2015-09-21 DIAGNOSIS — I739 Peripheral vascular disease, unspecified: Secondary | ICD-10-CM | POA: Diagnosis not present

## 2015-09-21 DIAGNOSIS — M7989 Other specified soft tissue disorders: Secondary | ICD-10-CM

## 2015-09-21 DIAGNOSIS — I5033 Acute on chronic diastolic (congestive) heart failure: Secondary | ICD-10-CM

## 2015-09-21 DIAGNOSIS — L03116 Cellulitis of left lower limb: Secondary | ICD-10-CM

## 2015-09-21 MED ORDER — GABAPENTIN 300 MG PO CAPS: 300.0000 mg | ORAL_CAPSULE | Freq: Three times a day (TID) | ORAL | Status: DC

## 2015-09-21 MED ORDER — SULFAMETHOXAZOLE-TRIMETHOPRIM 800-160 MG PO TABS: 1.0000 | ORAL_TABLET | Freq: Two times a day (BID) | ORAL | Status: DC

## 2015-09-21 MED ORDER — FUROSEMIDE 40 MG PO TABS: 40.0000 mg | ORAL_TABLET | Freq: Every day | ORAL | Status: DC | PRN

## 2015-09-21 NOTE — Assessment & Plan Note (Signed)
Will get her back into see vascular- appointment scheduled for May 26th.

## 2015-09-21 NOTE — Telephone Encounter (Signed)
PCP office called to schedule tcm/ph [facility] for chf appt Patient was d/c on 09-07-15 scheduled with [facility] on 10-03-15 at 11am

## 2015-09-21 NOTE — Assessment & Plan Note (Signed)
Lungs clear today, legs still quite swollen, suspect partially venous insufficiency as well as her CHF. Continue lasix, will get her back in to see her cardiologist. Encouraged her to use her compression stockings, but she notes that they are too uncomfortable to get on. Get her back into see vascular- due to see them May 26th. Will follow up with her in 1 week to see how her legs are doing and if this does have a cellulitis component.

## 2015-09-21 NOTE — Progress Notes (Signed)
BP 121/68 mmHg  Pulse 102  Temp(Src) 98.7 F (37.1 C)  Wt 138 lb (62.596 kg)  SpO2 95%   Subjective:    Patient ID: [name], female    DOB: April 24, 1951, 65 y.o.   MRN: [id number]  HPI: [name] is a 65 y.o. female  Chief Complaint  Patient presents with  . Hospitalization Follow-up    cellulits and acute CHF; she states she isn't much better. Thinks she needs more antibiotics for the cellulitis.  . Medication Refill    She needs a refill on furosemide   HOSPITAL FOLLOW UP Time since discharge: 2 weeks Hospital/facility: [facility] Diagnosis: Cellulitis and acute on chronic diastolic CHF, UTI Procedures/tests: ECHO: normal ejection fraction, LVH, no significant valvular abnormalities. VQ SCAN: No pulmonary embolism, B/L LE [place] : No DVT Consultants: cardiology New medications: 7 day course of keflex  Discharge instructions:  Follow up with vascular surgery and cardiology and here, finish keflex, ? ACE or ARB Status: better- still with a lot of swelling in her legs that are hurting a whole lot- less red a week ago, a lot more red now.   From the waist up feels better. Not feeling fuzzy in her head any more. Can't use her compression stockings because her legs are hurting too much.  Relevant past medical, surgical, family and social history reviewed and updated as indicated. Interim medical history since our last visit reviewed. Allergies and medications reviewed and updated.  Review of Systems  Constitutional: Negative.   Respiratory: Negative.   Cardiovascular: Positive for leg swelling. Negative for chest pain and palpitations.  Musculoskeletal: Negative for myalgias, back pain, joint swelling, arthralgias, gait problem, neck pain and neck stiffness.  Psychiatric/Behavioral: Negative.     Per HPI unless specifically indicated above     Objective:    BP 121/68 mmHg  Pulse 102  Temp(Src) 98.7 F (37.1 C)  Wt 138 lb (62.596 kg)  SpO2 95%  Wt Readings from  Last 3 Encounters:  09/21/15 138 lb (62.596 kg)  09/07/15 142 lb 11.2 oz (64.728 kg)  09/05/15 139 lb 2 oz (63.107 kg)    Physical Exam  Constitutional: She is oriented to person, place, and time. She appears well-developed and well-nourished. No distress.  HENT:  Head: Normocephalic and atraumatic.  Right Ear: Hearing normal.  Left Ear: Hearing normal.  Nose: Nose normal.  Eyes: Conjunctivae and lids are normal. Right eye exhibits no discharge. Left eye exhibits no discharge. No scleral icterus.  Cardiovascular: Normal rate, regular rhythm and intact distal pulses.  Exam reveals no gallop and no friction rub.   Murmur heard. Pulmonary/Chest: Effort normal and breath sounds normal. No respiratory distress. She has no wheezes. She has no rales. She exhibits no tenderness.  Musculoskeletal: Normal range of motion.  3+ edema on the L with heat and erythema 1/2 way up her shin, 2+ edema on the R without heat or erythema  Neurological: She is alert and oriented to person, place, and time.  Skin: Skin is warm, dry and intact. No rash noted. She is not diaphoretic. There is erythema. No pallor.  Psychiatric: She has a normal mood and affect. Her speech is normal and behavior is normal. Judgment and thought content normal. Cognition and memory are normal.  Nursing note and vitals reviewed.   Results for orders placed or performed during the hospital encounter of 09/05/15  Urine culture  Result Value Ref Range   Specimen Description URINE, RANDOM    Special  Requests NONE    Culture MULTIPLE SPECIES PRESENT, SUGGEST RECOLLECTION (A)    Report Status 09/07/2015 FINAL   Urine culture  Result Value Ref Range   Specimen Description URINE, CATHETERIZED    Special Requests Normal    Culture 1,000 COLONIES/mL INSIGNIFICANT GROWTH (A)    Report Status 09/09/2015 FINAL   Basic metabolic panel  Result Value Ref Range   Sodium 141 135 - 145 mmol/L   Potassium 3.9 3.5 - 5.1 mmol/L   Chloride 106  101 - 111 mmol/L   CO2 27 22 - 32 mmol/L   Glucose, Bld 96 65 - 99 mg/dL   BUN 27 (H) 6 - 20 mg/dL   Creatinine, Ser 1.36 (H) 0.44 - 1.00 mg/dL   Calcium 8.2 (L) 8.9 - 10.3 mg/dL   GFR calc non Af Amer 40 (L) >60 mL/min   GFR calc Af Amer 46 (L) >60 mL/min   Anion gap 8 5 - 15  CBC with Differential  Result Value Ref Range   WBC 6.8 3.6 - 11.0 K/uL   RBC 4.04 3.80 - 5.20 MIL/uL   Hemoglobin 10.5 (L) 12.0 - 16.0 g/dL   HCT 33.6 (L) 35.0 - 47.0 %   MCV 83.1 80.0 - 100.0 fL   MCH 25.9 (L) 26.0 - 34.0 pg   MCHC 31.2 (L) 32.0 - 36.0 g/dL   RDW 30.0 (H) 11.5 - 14.5 %   Platelets 461 (H) 150 - 440 K/uL   Neutrophils Relative % 69 %   Neutro Abs 4.8 1.4 - 6.5 K/uL   Lymphocytes Relative 20 %   Lymphs Abs 1.3 1.0 - 3.6 K/uL   Monocytes Relative 7 %   Monocytes Absolute 0.5 0.2 - 0.9 K/uL   Eosinophils Relative 3 %   Eosinophils Absolute 0.2 0 - 0.7 K/uL   Basophils Relative 1 %   Basophils Absolute 0.1 0 - 0.1 K/uL  Troponin I  Result Value Ref Range   Troponin I 0.03 <0.031 ng/mL  Urinalysis complete, with microscopic  Result Value Ref Range   Color, Urine YELLOW (A) YELLOW   APPearance CLEAR (A) CLEAR   Glucose, UA NEGATIVE NEGATIVE mg/dL   Bilirubin Urine NEGATIVE NEGATIVE   Ketones, ur NEGATIVE NEGATIVE mg/dL   Specific Gravity, Urine 1.023 1.005 - 1.030   Hgb urine dipstick NEGATIVE NEGATIVE   pH 5.0 5.0 - 8.0   Protein, ur NEGATIVE NEGATIVE mg/dL   Nitrite NEGATIVE NEGATIVE   Leukocytes, UA 3+ (A) NEGATIVE   RBC / HPF 0-5 0 - 5 RBC/hpf   WBC, UA 6-30 0 - 5 WBC/hpf   Bacteria, UA NONE SEEN NONE SEEN   Squamous Epithelial / LPF 0-5 (A) NONE SEEN   Mucous PRESENT    Hyaline Casts, UA PRESENT   TSH  Result Value Ref Range   TSH 0.099 (L) 0.350 - 4.500 uIU/mL  Hemoglobin A1c  Result Value Ref Range   Hgb A1c MFr Bld 4.5 4.0 - 6.0 %  T4, free  Result Value Ref Range   Free T4 1.12 0.61 - 1.12 ng/dL  Brain natriuretic peptide  Result Value Ref Range   B  Natriuretic Peptide 902.0 (H) 0.0 - 100.0 pg/mL  Basic metabolic panel  Result Value Ref Range   Sodium 140 135 - 145 mmol/L   Potassium 3.7 3.5 - 5.1 mmol/L   Chloride 108 101 - 111 mmol/L   CO2 27 22 - 32 mmol/L   Glucose, Bld 89 65 - 99 mg/dL  BUN 10 6 - 20 mg/dL   Creatinine, Ser 0.66 0.44 - 1.00 mg/dL   Calcium 8.0 (L) 8.9 - 10.3 mg/dL   GFR calc non Af Amer >60 >60 mL/min   GFR calc Af Amer >60 >60 mL/min   Anion gap 5 5 - 15  ECHO COMPLETE  Result Value Ref Range   Weight 2299.2 oz   Height 65 in   BP 136/52 mmHg      Assessment & Plan:   Problem List Items Addressed This Visit      Cardiovascular and Mediastinum   Acute on chronic diastolic (congestive) heart failure (HCC) - Primary    Lungs clear today, legs still quite swollen, suspect partially venous insufficiency as well as her CHF. Continue lasix, will get her back in to see her cardiologist. Encouraged her to use her compression stockings, but she notes that they are too uncomfortable to get on. Get her back into see vascular- due to see them May 26th. Will follow up with her in 1 week to see how her legs are doing and if this does have a cellulitis component.       Relevant Medications   furosemide (LASIX) 40 MG tablet   PAD (peripheral artery disease) ([place])    Will get her back into see vascular- appointment scheduled for May 26th.      Relevant Medications   furosemide (LASIX) 40 MG tablet     Other   Swelling of lower extremity    Likely multi-factorial, possible cellulitis component- will treat with bactrim. Recheck next week to see how she's doing.        Other Visit Diagnoses    Cellulitis of left lower extremity        Will treat with bactrim, + heat, but likely venous stasis changes also going on. Recheck 1 week.         Follow up plan: Return in about 1 week (around 09/28/2015) for recheck cellulitis.

## 2015-09-21 NOTE — Telephone Encounter (Signed)
Patient contacted regarding discharge from [facility] on 09/07/15.  Patient understands to follow up with Dr. Rockey Situ on 10/03/15 at 11:00 at [facility] Patient understands discharge instructions? yes Patient understands medications and regiment? yes Patient understands to bring all medications to this visit? yes

## 2015-09-21 NOTE — Assessment & Plan Note (Signed)
Likely multi-factorial, possible cellulitis component- will treat with bactrim. Recheck next week to see how she's doing.

## 2015-09-27 ENCOUNTER — Encounter: Payer: Self-pay | Admitting: Family Medicine

## 2015-09-27 ENCOUNTER — Ambulatory Visit (INDEPENDENT_AMBULATORY_CARE_PROVIDER_SITE_OTHER): Payer: Medicare Other | Admitting: Family Medicine

## 2015-09-27 VITALS — BP 117/68 | HR 109 | Temp 98.7°F | Wt 136.0 lb

## 2015-09-27 DIAGNOSIS — M7989 Other specified soft tissue disorders: Secondary | ICD-10-CM

## 2015-09-27 NOTE — Assessment & Plan Note (Signed)
Cellulitis seems to have resolved. Still very swollen. Seeing vascular on Friday and Cardiology on Tuesday. Elevate. Compression stockings. Call with concerns.

## 2015-09-27 NOTE — Progress Notes (Signed)
BP 117/68 mmHg  Pulse 109  Temp(Src) 98.7 F (37.1 C)  Wt 136 lb (61.689 kg)  SpO2 98%   Subjective:    Patient ID: [name], female    DOB: 05/20/1950, 65 y.o.   MRN: [id number]  HPI: [name] is a 65 y.o. female  Chief Complaint  Patient presents with  . Cellulitis    recheck, patient states it is improving   Feeling much better, but legs are feeling very tired. Having trouble supporting her. Did not take her lasix today as she had to drive. Has been trying to keep her legs up.  Relevant past medical, surgical, family and social history reviewed and updated as indicated. Interim medical history since our last visit reviewed. Allergies and medications reviewed and updated.  Review of Systems  Constitutional: Negative.   Respiratory: Negative.   Cardiovascular: Negative.   Musculoskeletal: Positive for myalgias and gait problem. Negative for back pain, joint swelling, arthralgias, neck pain and neck stiffness.  Psychiatric/Behavioral: Negative.     Per HPI unless specifically indicated above     Objective:    BP 117/68 mmHg  Pulse 109  Temp(Src) 98.7 F (37.1 C)  Wt 136 lb (61.689 kg)  SpO2 98%  Wt Readings from Last 3 Encounters:  09/27/15 136 lb (61.689 kg)  09/21/15 138 lb (62.596 kg)  09/07/15 142 lb 11.2 oz (64.728 kg)    Physical Exam  Constitutional: She is oriented to person, place, and time. She appears well-developed and well-nourished. No distress.  HENT:  Head: Normocephalic and atraumatic.  Right Ear: Hearing normal.  Left Ear: Hearing normal.  Nose: Nose normal.  Eyes: Conjunctivae and lids are normal. Right eye exhibits no discharge. Left eye exhibits no discharge. No scleral icterus.  Cardiovascular: Normal rate, regular rhythm, normal heart sounds and intact distal pulses.  Exam reveals no gallop and no friction rub.   No murmur heard. Pulmonary/Chest: Effort normal and breath sounds normal. No respiratory distress. She has no  wheezes. She has no rales. She exhibits no tenderness.  Musculoskeletal: Normal range of motion.  2+ edema on the L, 1+ edema on the R, no erythema, no heat  Neurological: She is alert and oriented to person, place, and time.  Skin: Skin is warm, dry and intact. No rash noted. No erythema. No pallor.  Psychiatric: She has a normal mood and affect. Her speech is normal and behavior is normal. Judgment and thought content normal. Cognition and memory are normal.  Nursing note and vitals reviewed.   Results for orders placed or performed during the hospital encounter of 09/05/15  Urine culture  Result Value Ref Range   Specimen Description URINE, RANDOM    Special Requests NONE    Culture MULTIPLE SPECIES PRESENT, SUGGEST RECOLLECTION (A)    Report Status 09/07/2015 FINAL   Urine culture  Result Value Ref Range   Specimen Description URINE, CATHETERIZED    Special Requests Normal    Culture 1,000 COLONIES/mL INSIGNIFICANT GROWTH (A)    Report Status 09/09/2015 FINAL   Basic metabolic panel  Result Value Ref Range   Sodium 141 135 - 145 mmol/L   Potassium 3.9 3.5 - 5.1 mmol/L   Chloride 106 101 - 111 mmol/L   CO2 27 22 - 32 mmol/L   Glucose, Bld 96 65 - 99 mg/dL   BUN 27 (H) 6 - 20 mg/dL   Creatinine, Ser 1.36 (H) 0.44 - 1.00 mg/dL   Calcium 8.2 (L) 8.9 - 10.3  mg/dL   GFR calc non Af Amer 40 (L) >60 mL/min   GFR calc Af Amer 46 (L) >60 mL/min   Anion gap 8 5 - 15  CBC with Differential  Result Value Ref Range   WBC 6.8 3.6 - 11.0 K/uL   RBC 4.04 3.80 - 5.20 MIL/uL   Hemoglobin 10.5 (L) 12.0 - 16.0 g/dL   HCT 33.6 (L) 35.0 - 47.0 %   MCV 83.1 80.0 - 100.0 fL   MCH 25.9 (L) 26.0 - 34.0 pg   MCHC 31.2 (L) 32.0 - 36.0 g/dL   RDW 30.0 (H) 11.5 - 14.5 %   Platelets 461 (H) 150 - 440 K/uL   Neutrophils Relative % 69 %   Neutro Abs 4.8 1.4 - 6.5 K/uL   Lymphocytes Relative 20 %   Lymphs Abs 1.3 1.0 - 3.6 K/uL   Monocytes Relative 7 %   Monocytes Absolute 0.5 0.2 - 0.9 K/uL    Eosinophils Relative 3 %   Eosinophils Absolute 0.2 0 - 0.7 K/uL   Basophils Relative 1 %   Basophils Absolute 0.1 0 - 0.1 K/uL  Troponin I  Result Value Ref Range   Troponin I 0.03 <0.031 ng/mL  Urinalysis complete, with microscopic  Result Value Ref Range   Color, Urine YELLOW (A) YELLOW   APPearance CLEAR (A) CLEAR   Glucose, UA NEGATIVE NEGATIVE mg/dL   Bilirubin Urine NEGATIVE NEGATIVE   Ketones, ur NEGATIVE NEGATIVE mg/dL   Specific Gravity, Urine 1.023 1.005 - 1.030   Hgb urine dipstick NEGATIVE NEGATIVE   pH 5.0 5.0 - 8.0   Protein, ur NEGATIVE NEGATIVE mg/dL   Nitrite NEGATIVE NEGATIVE   Leukocytes, UA 3+ (A) NEGATIVE   RBC / HPF 0-5 0 - 5 RBC/hpf   WBC, UA 6-30 0 - 5 WBC/hpf   Bacteria, UA NONE SEEN NONE SEEN   Squamous Epithelial / LPF 0-5 (A) NONE SEEN   Mucous PRESENT    Hyaline Casts, UA PRESENT   TSH  Result Value Ref Range   TSH 0.099 (L) 0.350 - 4.500 uIU/mL  Hemoglobin A1c  Result Value Ref Range   Hgb A1c MFr Bld 4.5 4.0 - 6.0 %  T4, free  Result Value Ref Range   Free T4 1.12 0.61 - 1.12 ng/dL  Brain natriuretic peptide  Result Value Ref Range   B Natriuretic Peptide 902.0 (H) 0.0 - 100.0 pg/mL  Basic metabolic panel  Result Value Ref Range   Sodium 140 135 - 145 mmol/L   Potassium 3.7 3.5 - 5.1 mmol/L   Chloride 108 101 - 111 mmol/L   CO2 27 22 - 32 mmol/L   Glucose, Bld 89 65 - 99 mg/dL   BUN 10 6 - 20 mg/dL   Creatinine, Ser 0.66 0.44 - 1.00 mg/dL   Calcium 8.0 (L) 8.9 - 10.3 mg/dL   GFR calc non Af Amer >60 >60 mL/min   GFR calc Af Amer >60 >60 mL/min   Anion gap 5 5 - 15  ECHO COMPLETE  Result Value Ref Range   Weight 2299.2 oz   Height 65 in   BP 136/52 mmHg      Assessment & Plan:   Problem List Items Addressed This Visit      Other   Swelling of lower extremity - Primary    Cellulitis seems to have resolved. Still very swollen. Seeing vascular on Friday and Cardiology on Tuesday. Elevate. Compression stockings. Call with  concerns.  Follow up plan: Return in about 3 months (around 12/28/2015).

## 2015-09-29 DIAGNOSIS — I10 Essential (primary) hypertension: Secondary | ICD-10-CM | POA: Diagnosis not present

## 2015-09-29 DIAGNOSIS — M79609 Pain in unspecified limb: Secondary | ICD-10-CM | POA: Diagnosis not present

## 2015-09-29 DIAGNOSIS — F17200 Nicotine dependence, unspecified, uncomplicated: Secondary | ICD-10-CM | POA: Diagnosis not present

## 2015-09-29 DIAGNOSIS — I6522 Occlusion and stenosis of left carotid artery: Secondary | ICD-10-CM | POA: Diagnosis not present

## 2015-09-29 DIAGNOSIS — I2510 Atherosclerotic heart disease of native coronary artery without angina pectoris: Secondary | ICD-10-CM | POA: Diagnosis not present

## 2015-09-29 DIAGNOSIS — M7989 Other specified soft tissue disorders: Secondary | ICD-10-CM | POA: Diagnosis not present

## 2015-09-29 DIAGNOSIS — I6523 Occlusion and stenosis of bilateral carotid arteries: Secondary | ICD-10-CM | POA: Diagnosis not present

## 2015-09-29 DIAGNOSIS — E785 Hyperlipidemia, unspecified: Secondary | ICD-10-CM | POA: Diagnosis not present

## 2015-09-29 DIAGNOSIS — I509 Heart failure, unspecified: Secondary | ICD-10-CM | POA: Diagnosis not present

## 2015-10-01 DIAGNOSIS — I5021 Acute systolic (congestive) heart failure: Secondary | ICD-10-CM | POA: Diagnosis not present

## 2015-10-03 ENCOUNTER — Ambulatory Visit (INDEPENDENT_AMBULATORY_CARE_PROVIDER_SITE_OTHER): Payer: Medicare Other | Admitting: Cardiovascular Disease

## 2015-10-03 ENCOUNTER — Encounter: Payer: Self-pay | Admitting: Cardiovascular Disease

## 2015-10-03 ENCOUNTER — Other Ambulatory Visit: Payer: Self-pay | Admitting: Family Medicine

## 2015-10-03 VITALS — BP 100/60 | HR 90 | Ht 65.0 in | Wt 134.5 lb

## 2015-10-03 DIAGNOSIS — M7989 Other specified soft tissue disorders: Secondary | ICD-10-CM

## 2015-10-03 DIAGNOSIS — I2510 Atherosclerotic heart disease of native coronary artery without angina pectoris: Secondary | ICD-10-CM | POA: Diagnosis not present

## 2015-10-03 DIAGNOSIS — I5033 Acute on chronic diastolic (congestive) heart failure: Secondary | ICD-10-CM

## 2015-10-03 DIAGNOSIS — I739 Peripheral vascular disease, unspecified: Secondary | ICD-10-CM | POA: Diagnosis not present

## 2015-10-03 DIAGNOSIS — I10 Essential (primary) hypertension: Secondary | ICD-10-CM | POA: Diagnosis not present

## 2015-10-03 MED ORDER — HYDROCODONE-ACETAMINOPHEN 10-325 MG PO TABS: 1.0000 | ORAL_TABLET | Freq: Three times a day (TID) | ORAL | Status: DC | PRN

## 2015-10-03 NOTE — Assessment & Plan Note (Signed)
Blood pressure is well controlled on today's visit. No changes made to the medications. 

## 2015-10-03 NOTE — Patient Instructions (Addendum)
You are doing well. No medication changes were made.  Please call us if you have new issues that need to be addressed before your next appt.  Your physician wants you to follow-up in: 6 months.  You will receive a reminder letter in the mail two months in advance. If you don't receive a letter, please call our office to schedule the follow-up appointment.  Steps to Quit Smoking  Smoking tobacco can be harmful to your health and can affect almost every organ in your body. Smoking puts you, and those around you, at risk for developing many serious chronic diseases. Quitting smoking is difficult, but it is one of the best things that you can do for your health. It is never too late to quit. WHAT ARE THE BENEFITS OF QUITTING SMOKING? When you quit smoking, you lower your risk of developing serious diseases and conditions, such as:  Lung cancer or lung disease, such as COPD.  Heart disease.  Stroke.  Heart attack.  Infertility.  Osteoporosis and bone fractures. Additionally, symptoms such as coughing, wheezing, and shortness of breath may get better when you quit. You may also find that you get sick less often because your body is stronger at fighting off colds and infections. If you are pregnant, quitting smoking can help to reduce your chances of having a baby of low birth weight. HOW DO I GET READY TO QUIT? When you decide to quit smoking, create a plan to make sure that you are successful. Before you quit:  Pick a date to quit. Set a date within the next two weeks to give you time to prepare.  Write down the reasons why you are quitting. Keep this list in places where you will see it often, such as on your bathroom mirror or in your car or wallet.  Identify the people, places, things, and activities that make you want to smoke (triggers) and avoid them. Make sure to take these actions:  Throw away all cigarettes at home, at work, and in your car.  Throw away smoking accessories,  such as Scientist, research (medical).  Clean your car and make sure to empty the ashtray.  Clean your home, including curtains and carpets.  Tell your family, friends, and coworkers that you are quitting. Support from your loved ones can make quitting easier.  Talk with your health care provider about your options for quitting smoking.  Find out what treatment options are covered by your health insurance. WHAT STRATEGIES CAN I USE TO QUIT SMOKING?  Talk with your healthcare provider about different strategies to quit smoking. Some strategies include:  Quitting smoking altogether instead of gradually lessening how much you smoke over a period of time. Research shows that quitting "cold [place]" is more successful than gradually quitting.  Attending in-person counseling to help you build problem-solving skills. You are more likely to have success in quitting if you attend several counseling sessions. Even short sessions of 10 minutes can be effective.  Finding resources and support systems that can help you to quit smoking and remain smoke-free after you quit. These resources are most helpful when you use them often. They can include:  Online chats with a Social worker.  Telephone quitlines.  Printed Furniture conservator/restorer.  Support groups or group counseling.  Text messaging programs.  Mobile phone applications.  Taking medicines to help you quit smoking. (If you are pregnant or breastfeeding, talk with your health care provider first.) Some medicines contain nicotine and some do not. Both types  of medicines help with cravings, but the medicines that include nicotine help to relieve withdrawal symptoms. Your health care provider may recommend:  Nicotine patches, gum, or lozenges.  Nicotine inhalers or sprays.  Non-nicotine medicine that is taken by mouth. Talk with your health care provider about combining strategies, such as taking medicines while you are also receiving in-person counseling.  Using these two strategies together makes you more likely to succeed in quitting than if you used either strategy on its own. If you are pregnant or breastfeeding, talk with your health care provider about finding counseling or other support strategies to quit smoking. Do not take medicine to help you quit smoking unless told to do so by your health care provider. WHAT THINGS CAN I DO TO MAKE IT EASIER TO QUIT? Quitting smoking might feel overwhelming at first, but there is a lot that you can do to make it easier. Take these important actions:  Reach out to your family and friends and ask that they support and encourage you during this time. Call telephone quitlines, reach out to support groups, or work with a counselor for support.  Ask people who smoke to avoid smoking around you.  Avoid places that trigger you to smoke, such as bars, parties, or smoke-break areas at work.  Spend time around people who do not smoke.  Lessen stress in your life, because stress can be a smoking trigger for some people. To lessen stress, try:  Exercising regularly.  Deep-breathing exercises.  Yoga.  Meditating.  Performing a body scan. This involves closing your eyes, scanning your body from head to toe, and noticing which parts of your body are particularly tense. Purposefully relax the muscles in those areas.  Download or purchase mobile phone or tablet apps (applications) that can help you stick to your quit plan by providing reminders, tips, and encouragement. There are many free apps, such as QuitGuide from the [employer] Office manager for Disease Control and Prevention). You can find other support for quitting smoking (smoking cessation) through smokefree.gov and other websites. HOW WILL I FEEL WHEN I QUIT SMOKING? Within the first 24 hours of quitting smoking, you may start to feel some withdrawal symptoms. These symptoms are usually most noticeable 2-3 days after quitting, but they usually do not last beyond  2-3 weeks. Changes or symptoms that you might experience include:  Mood swings.  Restlessness, anxiety, or irritation.  Difficulty concentrating.  Dizziness.  Strong cravings for sugary foods in addition to nicotine.  Mild weight gain.  Constipation.  Nausea.  Coughing or a sore throat.  Changes in how your medicines work in your body.  A depressed mood.  Difficulty sleeping (insomnia). After the first 2-3 weeks of quitting, you may start to notice more positive results, such as:  Improved sense of smell and taste.  Decreased coughing and sore throat.  Slower heart rate.  Lower blood pressure.  Clearer skin.  The ability to breathe more easily.  Fewer sick days. Quitting smoking is very challenging for most people. Do not get discouraged if you are not successful the first time. Some people need to make many attempts to quit before they achieve long-term success. Do your best to stick to your quit plan, and talk with your health care provider if you have any questions or concerns.   This information is not intended to replace advice given to you by your health care provider. Make sure you discuss any questions you have with your health care provider.  Document Released: 04/16/2001 Document Revised: 09/06/2014 Document Reviewed: 09/06/2014 Elsevier Interactive Patient Education 2016 [employer]. Smoking Hazards Smoking cigarettes is extremely bad for your health. Tobacco smoke has over 200 known poisons in it. It contains the poisonous gases nitrogen oxide and carbon monoxide. There are over 60 chemicals in tobacco smoke that cause cancer. Some of the chemicals found in cigarette smoke include:   Cyanide.   Benzene.   Formaldehyde.   Methanol (wood alcohol).   Acetylene (fuel used in welding torches).   Ammonia.  Even smoking lightly shortens your life expectancy by several years. You can greatly reduce the risk of medical problems for you and your  family by stopping now. Smoking is the most preventable cause of death and disease in our society. Within days of quitting smoking, your circulation improves, you decrease the risk of having a heart attack, and your lung capacity improves. There may be some increased phlegm in the first few days after quitting, and it may take months for your lungs to clear up completely. Quitting for 10 years reduces your risk of developing lung cancer to almost that of a nonsmoker.  WHAT ARE THE RISKS OF SMOKING? Cigarette smokers have an increased risk of many serious medical problems, including:  Lung cancer.   Lung disease (such as pneumonia, bronchitis, and emphysema).   Heart attack and chest pain due to the heart not getting enough oxygen (angina).   Heart disease and peripheral blood vessel disease.   Hypertension.   Stroke.   Oral cancer (cancer of the lip, mouth, or voice box).   Bladder cancer.   Pancreatic cancer.   Cervical cancer.   Pregnancy complications, including premature birth.   Stillbirths and smaller newborn babies, birth defects, and genetic damage to sperm.   Early menopause.   Lower estrogen level for women.   Infertility.   Facial wrinkles.   Blindness.   Increased risk of broken bones (fractures).   Senile dementia.   Stomach ulcers and internal bleeding.   Delayed wound healing and increased risk of complications during surgery. Because of secondhand smoke exposure, children of smokers have an increased risk of the following:   Sudden infant death syndrome (SIDS).   Respiratory infections.   Lung cancer.   Heart disease.   Ear infections.  WHY IS SMOKING ADDICTIVE? Nicotine is the chemical agent in tobacco that is capable of causing addiction or dependence. When you smoke and inhale, nicotine is absorbed rapidly into the bloodstream through your lungs. Both inhaled and noninhaled nicotine may be addictive.  WHAT ARE THE  BENEFITS OF QUITTING?  There are many health benefits to quitting smoking. Some are:   The likelihood of developing cancer and heart disease decreases. Health improvements are seen almost immediately.   Blood pressure, pulse rate, and breathing patterns start returning to normal soon after quitting.   People who quit may see an improvement in their overall quality of life.  HOW DO YOU QUIT SMOKING? Smoking is an addiction with both physical and psychological effects, and longtime habits can be hard to change. Your health care provider can recommend:  Programs and community resources, which may include group support, education, or therapy.  Replacement products, such as patches, gum, and nasal sprays. Use these products only as directed. Do not replace cigarette smoking with electronic cigarettes (commonly called e-cigarettes). The safety of e-cigarettes is unknown, and some may contain harmful chemicals. FOR MORE INFORMATION  American Lung Association: www.lung.org  American Cancer Society: www.cancer.org  This information is not intended to replace advice given to you by your health care provider. Make sure you discuss any questions you have with your health care provider.   Document Released: 05/30/2004 Document Revised: 02/10/2013 Document Reviewed: 10/12/2012 Elsevier Interactive Patient Education 2016 [place] WHAT IS SECONDHAND SMOKE? Secondhand smoke is smoke that comes from burning tobacco. It could be the smoke from a cigarette, a pipe, or a cigar. Even if you are not the one smoking, secondhand smoke exposes you to the dangers of smoking. This is called involuntary, or passive, smoking. There are two types of secondhand smoke:  Sidestream smoke is the smoke that comes off the lighted end of a cigarette, pipe, or cigar.  This type of smoke has the highest amount of cancer-causing agents (carcinogens).  The particles in sidestream smoke are smaller.  They get into your lungs more easily.  Mainstream smoke is the smoke that is exhaled by a person who is smoking.  This type of smoke is also dangerous to your health. HOW CAN SECONDHAND SMOKE AFFECT MY HEALTH? Studies show that there is no safe level of secondhand smoke. This smoke contains thousands of chemicals. At least 43 of them are known to cause cancer. Secondhand smoke can also cause many other health problems. It has been linked to:  Lung cancer.  Cancer of the voice box (larynx) or throat.  Cancer of the sinuses.  Brain cancer.  Bladder cancer.  Stomach cancer.  Breast cancer.  White blood cell cancers (lymphoma and leukemia).  Brain and liver tumors in children.  Heart disease and stroke in adults.  Pregnancy loss (miscarriage).  Diseases in children, such as:  Asthma.  Lung infections.  Ear infections.  Sudden infant death syndrome (SIDS).  Slow growth. WHERE CAN I BE AT RISK FOR EXPOSURE TO SECONDHAND SMOKE?   For adults, the workplace is the main source of exposure to secondhand smoke.  Your workplace should have a policy separating smoking areas from nonsmoking areas.  Smoking areas should have a system for ventilating and cleaning the air.  For children, the home may be the most dangerous place for exposure to secondhand smoke.  Children who live in apartment buildings may be at risk from smoke drifting from hallways or other people's homes.  For everyone, many public places are possible sources of exposure to secondhand smoke.  These places include restaurants, shopping centers, and parks. HOW CAN I REDUCE MY RISK FOR EXPOSURE TO SECONDHAND SMOKE? The most important thing you can do is not smoke. Discourage family members from smoking. Other ways to reduce exposure for you and your family include the following:  Keep your home smoke free.  Make sure your child care providers do not smoke.  Warn your child about the dangers of smoking  and secondhand smoke.  Do not allow smoking in your car. When someone smokes in a car, all the damaging chemicals from the smoke are confined in a small area.  Avoid public places where smoking is allowed.   This information is not intended to replace advice given to you by your health care provider. Make sure you discuss any questions you have with your health care provider.   Document Released: 05/30/2004 Document Revised: 05/13/2014 Document Reviewed: 08/06/2013 Elsevier Interactive Patient Education [employer].

## 2015-10-03 NOTE — Assessment & Plan Note (Signed)
Leg swelling likely secondary to chronic venous insufficiency, unable to exclude component of lymphedema.  unable to wear compression hose. Recommended she try Ace wraps and once legs improve, transition to compression hose.  Also leg elevation  extra Lasix will not help her swelling as documented 09/05/2015 when she was prerenal on Lasix twice a day and still had leg edema

## 2015-10-03 NOTE — Assessment & Plan Note (Signed)
She is euvolemic, if anything on month ago was dehydrated on high-dose Lasix twice a day  recommended she not take Lasix more than once per day
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
## 2015-10-03 NOTE — Assessment & Plan Note (Signed)
Recent carotid ultrasound showing stable disease  in the past did not want a statin  recommended smoking cessation

## 2015-10-05 ENCOUNTER — Inpatient Hospital Stay: Payer: Medicare Other

## 2015-10-05 ENCOUNTER — Inpatient Hospital Stay (HOSPITAL_BASED_OUTPATIENT_CLINIC_OR_DEPARTMENT_OTHER): Payer: Medicare Other | Admitting: Oncology

## 2015-10-05 ENCOUNTER — Inpatient Hospital Stay: Payer: Medicare Other | Attending: Oncology

## 2015-10-05 VITALS — BP 130/77 | HR 95 | Temp 99.7°F | Resp 16 | Wt 136.9 lb

## 2015-10-05 DIAGNOSIS — Z8701 Personal history of pneumonia (recurrent): Secondary | ICD-10-CM

## 2015-10-05 DIAGNOSIS — D509 Iron deficiency anemia, unspecified: Secondary | ICD-10-CM

## 2015-10-05 DIAGNOSIS — Z79899 Other long term (current) drug therapy: Secondary | ICD-10-CM

## 2015-10-05 DIAGNOSIS — E039 Hypothyroidism, unspecified: Secondary | ICD-10-CM

## 2015-10-05 DIAGNOSIS — R609 Edema, unspecified: Secondary | ICD-10-CM | POA: Diagnosis not present

## 2015-10-05 DIAGNOSIS — I428 Other cardiomyopathies: Secondary | ICD-10-CM | POA: Diagnosis not present

## 2015-10-05 DIAGNOSIS — F17210 Nicotine dependence, cigarettes, uncomplicated: Secondary | ICD-10-CM

## 2015-10-05 DIAGNOSIS — G8929 Other chronic pain: Secondary | ICD-10-CM | POA: Diagnosis not present

## 2015-10-05 DIAGNOSIS — M549 Dorsalgia, unspecified: Secondary | ICD-10-CM | POA: Diagnosis not present

## 2015-10-05 DIAGNOSIS — Z9981 Dependence on supplemental oxygen: Secondary | ICD-10-CM

## 2015-10-05 DIAGNOSIS — M4800 Spinal stenosis, site unspecified: Secondary | ICD-10-CM | POA: Diagnosis not present

## 2015-10-05 DIAGNOSIS — Z7982 Long term (current) use of aspirin: Secondary | ICD-10-CM | POA: Diagnosis not present

## 2015-10-05 DIAGNOSIS — I5022 Chronic systolic (congestive) heart failure: Secondary | ICD-10-CM | POA: Insufficient documentation

## 2015-10-05 DIAGNOSIS — Z8711 Personal history of peptic ulcer disease: Secondary | ICD-10-CM | POA: Diagnosis not present

## 2015-10-05 DIAGNOSIS — I252 Old myocardial infarction: Secondary | ICD-10-CM

## 2015-10-05 DIAGNOSIS — E785 Hyperlipidemia, unspecified: Secondary | ICD-10-CM | POA: Diagnosis not present

## 2015-10-05 DIAGNOSIS — F329 Major depressive disorder, single episode, unspecified: Secondary | ICD-10-CM | POA: Diagnosis not present

## 2015-10-05 DIAGNOSIS — K219 Gastro-esophageal reflux disease without esophagitis: Secondary | ICD-10-CM

## 2015-10-05 DIAGNOSIS — I739 Peripheral vascular disease, unspecified: Secondary | ICD-10-CM | POA: Insufficient documentation

## 2015-10-05 LAB — IRON AND TIBC
Iron: 35 ug/dL (ref 28–170)
Saturation Ratios: 14 % (ref 10.4–31.8)
TIBC: 252 ug/dL (ref 250–450)
UIBC: 217 ug/dL

## 2015-10-05 LAB — FERRITIN: Ferritin: 21 ng/mL (ref 11–307)

## 2015-10-05 LAB — CBC WITH DIFFERENTIAL/PLATELET
Basophils Absolute: 0 10*3/uL (ref 0–0.1)
Basophils Relative: 0 %
Eosinophils Absolute: 0.4 10*3/uL (ref 0–0.7)
Eosinophils Relative: 5 %
HEMATOCRIT: 38.1 % (ref 35.0–47.0)
HEMOGLOBIN: 12.6 g/dL (ref 12.0–16.0)
LYMPHS ABS: 2.3 10*3/uL (ref 1.0–3.6)
Lymphocytes Relative: 29 %
MCH: 28.4 pg (ref 26.0–34.0)
MCHC: 33.1 g/dL (ref 32.0–36.0)
MCV: 85.8 fL (ref 80.0–100.0)
MONOS PCT: 6 %
Monocytes Absolute: 0.5 10*3/uL (ref 0.2–0.9)
NEUTROS ABS: 4.8 10*3/uL (ref 1.4–6.5)
NEUTROS PCT: 60 %
Platelets: 332 10*3/uL (ref 150–440)
RBC: 4.44 MIL/uL (ref 3.80–5.20)
RDW: 22.8 % — ABNORMAL HIGH (ref 11.5–14.5)
WBC: 7.9 10*3/uL (ref 3.6–11.0)
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
## 2015-10-05 NOTE — Progress Notes (Signed)
Patient has been treated for cellulitis in her legs.  Does not offer any problems in regards to her anemia.

## 2015-11-01 DIAGNOSIS — I5021 Acute systolic (congestive) heart failure: Secondary | ICD-10-CM | POA: Diagnosis not present

## 2015-11-06 ENCOUNTER — Other Ambulatory Visit: Payer: Self-pay | Admitting: Family Medicine

## 2015-11-06 MED ORDER — HYDROCODONE-ACETAMINOPHEN 10-325 MG PO TABS: 1.0000 | ORAL_TABLET | Freq: Three times a day (TID) | ORAL | Status: DC | PRN

## 2015-11-24 DIAGNOSIS — I2510 Atherosclerotic heart disease of native coronary artery without angina pectoris: Secondary | ICD-10-CM | POA: Diagnosis not present

## 2015-11-24 DIAGNOSIS — I6522 Occlusion and stenosis of left carotid artery: Secondary | ICD-10-CM | POA: Diagnosis not present

## 2015-11-24 DIAGNOSIS — F17200 Nicotine dependence, unspecified, uncomplicated: Secondary | ICD-10-CM | POA: Diagnosis not present

## 2015-11-24 DIAGNOSIS — I10 Essential (primary) hypertension: Secondary | ICD-10-CM | POA: Diagnosis not present

## 2015-11-24 DIAGNOSIS — E785 Hyperlipidemia, unspecified: Secondary | ICD-10-CM | POA: Diagnosis not present

## 2015-11-24 DIAGNOSIS — I509 Heart failure, unspecified: Secondary | ICD-10-CM | POA: Diagnosis not present

## 2015-11-24 DIAGNOSIS — M7989 Other specified soft tissue disorders: Secondary | ICD-10-CM | POA: Diagnosis not present

## 2015-11-24 DIAGNOSIS — I6523 Occlusion and stenosis of bilateral carotid arteries: Secondary | ICD-10-CM | POA: Diagnosis not present

## 2015-11-24 DIAGNOSIS — M79609 Pain in unspecified limb: Secondary | ICD-10-CM | POA: Diagnosis not present

## 2015-11-28 ENCOUNTER — Other Ambulatory Visit: Payer: Self-pay | Admitting: Family Medicine

## 2015-11-28 MED ORDER — HYDROCODONE-ACETAMINOPHEN 10-325 MG PO TABS: 1.0000 | ORAL_TABLET | Freq: Three times a day (TID) | ORAL | 0 refills | Status: DC | PRN

## 2015-12-01 DIAGNOSIS — I5021 Acute systolic (congestive) heart failure: Secondary | ICD-10-CM | POA: Diagnosis not present

## 2015-12-28 ENCOUNTER — Encounter: Payer: Self-pay | Admitting: Family Medicine

## 2015-12-28 ENCOUNTER — Ambulatory Visit (INDEPENDENT_AMBULATORY_CARE_PROVIDER_SITE_OTHER): Payer: Medicare Other | Admitting: Family Medicine

## 2015-12-28 VITALS — BP 105/68 | HR 91 | Temp 98.6°F | Wt 125.0 lb

## 2015-12-28 DIAGNOSIS — D509 Iron deficiency anemia, unspecified: Secondary | ICD-10-CM | POA: Diagnosis not present

## 2015-12-28 DIAGNOSIS — G2581 Restless legs syndrome: Secondary | ICD-10-CM

## 2015-12-28 DIAGNOSIS — N179 Acute kidney failure, unspecified: Secondary | ICD-10-CM

## 2015-12-28 DIAGNOSIS — E039 Hypothyroidism, unspecified: Secondary | ICD-10-CM | POA: Diagnosis not present

## 2015-12-28 MED ORDER — GABAPENTIN 100 MG PO CAPS: 100.0000 mg | ORAL_CAPSULE | Freq: Every day | ORAL | 0 refills | Status: DC

## 2015-12-28 MED ORDER — ROPINIROLE HCL 0.25 MG PO TABS: ORAL_TABLET | ORAL | 0 refills | Status: DC

## 2015-12-28 NOTE — Progress Notes (Signed)
BP 105/68 (BP Location: Left Arm, Patient Position: Sitting, Cuff Size: Normal)   Pulse 91   Temp 98.6 F (37 C)   Wt 125 lb (56.7 kg)   SpO2 97%   BMI 20.80 kg/m    Subjective:    Patient ID: [name], female    DOB: April 02, 1951, 65 y.o.   MRN: [id number]  HPI: [name] is a 65 y.o. female  Chief Complaint  Patient presents with  . Hypothyroidism   HYPOTHYROIDISM Thyroid control status:better Satisfied with current treatment? yes Medication side effects: no Medication compliance: excellent compliance Recent dose adjustment:yes Fatigue: no Cold intolerance: no Heat intolerance: no Weight gain: no Weight loss: no Constipation: no Diarrhea/loose stools: no Palpitations: no Lower extremity edema: no Anxiety/depressed mood: no  ANEMIA Anemia status: controlled Etiology of anemia: iron deficiency Duration of anemia treatment: several months Compliance with treatment: excellent compliance Iron supplementation side effects: no Severity of anemia: moderate Fatigue: no Decreased exercise tolerance: no  Dyspnea on exertion: no Palpitations: no Bleeding: no Pica: no  RESTLESS LEGS- Leg pain has been going a long time. Stays swollen. Doesn't seem to be helping all that much. Legs cramp and hurt and feel electric.  Duration: chronic Discomfort description:  electric, Creeping, crawling and cramping Pain: yes Location: lower legs Bilateral: no Symmetric: no Severity: moderate Onset:  gradual Frequency:  constant Symptoms only occur while legs at rest: yes Sudden unintentional leg jerking: yes Bed partner bothered by leg movements: no LE numbness: yes Decreased sensation: yes Weakness: no Insomnia: yes Daytime somnolence: no Fatigue: no Status: stable Treatments attempted: gabapentin  Relevant past medical, surgical, family and social history reviewed and updated as indicated. Interim medical history since our last visit reviewed. Allergies  and medications reviewed and updated.  Review of Systems  Constitutional: Negative.   Respiratory: Negative.   Cardiovascular: Negative.   Musculoskeletal: Positive for myalgias. Negative for arthralgias, back pain, gait problem, joint swelling, neck pain and neck stiffness.  Psychiatric/Behavioral: Negative.     Per HPI unless specifically indicated above     Objective:    BP 105/68 (BP Location: Left Arm, Patient Position: Sitting, Cuff Size: Normal)   Pulse 91   Temp 98.6 F (37 C)   Wt 125 lb (56.7 kg)   SpO2 97%   BMI 20.80 kg/m   Wt Readings from Last 3 Encounters:  12/28/15 125 lb (56.7 kg)  10/05/15 136 lb 14.5 oz (62.1 kg)  10/03/15 134 lb 8 oz (61 kg)    Physical Exam  Constitutional: She is oriented to person, place, and time. She appears well-developed and well-nourished. No distress.  HENT:  Head: Normocephalic and atraumatic.  Right Ear: Hearing normal.  Left Ear: Hearing normal.  Nose: Nose normal.  Eyes: Conjunctivae and lids are normal. Right eye exhibits no discharge. Left eye exhibits no discharge. No scleral icterus.  Pulmonary/Chest: Effort normal. No respiratory distress.  Musculoskeletal: Normal range of motion.  Neurological: She is alert and oriented to person, place, and time.  Skin: Skin is warm, dry and intact. No rash noted. No erythema. No pallor.  Psychiatric: She has a normal mood and affect. Her speech is normal and behavior is normal. Judgment and thought content normal. Cognition and memory are normal.  Nursing note and vitals reviewed.   Results for orders placed or performed in visit on 10/05/15  CBC with Differential/Platelet  Result Value Ref Range   WBC 7.9 3.6 - 11.0 K/uL   RBC 4.44  3.80 - 5.20 MIL/uL   Hemoglobin 12.6 12.0 - 16.0 g/dL   HCT 38.1 35.0 - 47.0 %   MCV 85.8 80.0 - 100.0 fL   MCH 28.4 26.0 - 34.0 pg   MCHC 33.1 32.0 - 36.0 g/dL   RDW 22.8 (H) 11.5 - 14.5 %   Platelets 332 150 - 440 K/uL   Neutrophils  Relative % 60 %   Neutro Abs 4.8 1.4 - 6.5 K/uL   Lymphocytes Relative 29 %   Lymphs Abs 2.3 1.0 - 3.6 K/uL   Monocytes Relative 6 %   Monocytes Absolute 0.5 0.2 - 0.9 K/uL   Eosinophils Relative 5 %   Eosinophils Absolute 0.4 0 - 0.7 K/uL   Basophils Relative 0 %   Basophils Absolute 0.0 0 - 0.1 K/uL  Ferritin  Result Value Ref Range   Ferritin 21 11 - 307 ng/mL  Iron and TIBC  Result Value Ref Range   Iron 35 28 - 170 ug/dL   TIBC 252 250 - 450 ug/dL   Saturation Ratios 14 10.4 - 31.8 %   UIBC 217 ug/dL      Assessment & Plan:   Problem List Items Addressed This Visit      Endocrine   Hypothyroidism - Primary    Feeling good today. Rechecking levels. Adjust as needed.         Genitourinary   ARF (acute renal failure) (HCC)    Rechecking levels. Adjust as needed.         Other   Iron deficiency anemia    Continue to follow with hematology. Feeling good. Rechecking levels today.      RLS (restless legs syndrome)    Will get her off her gabapentin and start requip. Follow up in 2-3 weeks to see how she's doing.        Other Visit Diagnoses   None.      Follow up plan: Return 2-3 weeks, for follow up RLS.

## 2015-12-28 NOTE — Assessment & Plan Note (Signed)
Rechecking levels. Adjust as needed.

## 2015-12-28 NOTE — Patient Instructions (Signed)
Start 300mg  in AM and PM for 1 week, then 300mg  in AM for a week, then 200mg  qAM for 1 week, then 100mg  qAM for 1 week, then stop  Start 0.2mg  qPM x 2 days, then increase to 2 tabs for 5 days, , then increase 2 tabs per week until you get to 3mg  (you'll see me before this)

## 2015-12-28 NOTE — Assessment & Plan Note (Signed)
Will get her off her gabapentin and start requip. Follow up in 2-3 weeks to see how she's doing.

## 2015-12-28 NOTE — Assessment & Plan Note (Signed)
Continue to follow with hematology. Feeling good. Rechecking levels today.

## 2015-12-28 NOTE — Assessment & Plan Note (Signed)
Feeling good today. Rechecking levels. Adjust as needed.

## 2015-12-29 ENCOUNTER — Encounter: Payer: Self-pay | Admitting: Family Medicine

## 2015-12-29 LAB — THYROID PANEL WITH TSH
Free Thyroxine Index: 1.4 (ref 1.2–4.9)
T3 Uptake Ratio: 28 % (ref 24–39)
T4, Total: 5 ug/dL (ref 4.5–12.0)
TSH: 2.42 u[IU]/mL (ref 0.450–4.500)

## 2015-12-29 LAB — CBC WITH DIFFERENTIAL/PLATELET
Basophils Absolute: 0 10*3/uL (ref 0.0–0.2)
Basos: 0 %
EOS (ABSOLUTE): 0.4 10*3/uL (ref 0.0–0.4)
EOS: 5 %
HEMATOCRIT: 41.1 % (ref 34.0–46.6)
HEMOGLOBIN: 12.8 g/dL (ref 11.1–15.9)
IMMATURE GRANS (ABS): 0 10*3/uL (ref 0.0–0.1)
Immature Granulocytes: 0 %
LYMPHS ABS: 2.4 10*3/uL (ref 0.7–3.1)
LYMPHS: 26 %
MCH: 28.1 pg (ref 26.6–33.0)
MCHC: 31.1 g/dL — AB (ref 31.5–35.7)
MCV: 90 fL (ref 79–97)
MONOCYTES: 7 %
Monocytes Absolute: 0.7 10*3/uL (ref 0.1–0.9)
NEUTROS ABS: 5.7 10*3/uL (ref 1.4–7.0)
Neutrophils: 62 %
Platelets: 366 10*3/uL (ref 150–379)
RBC: 4.56 x10E6/uL (ref 3.77–5.28)
RDW: 17.8 % — ABNORMAL HIGH (ref 12.3–15.4)
WBC: 9.2 10*3/uL (ref 3.4–10.8)

## 2015-12-29 LAB — BASIC METABOLIC PANEL
BUN/Creatinine Ratio: 13 (ref 12–28)
BUN: 10 mg/dL (ref 8–27)
CO2: 24 mmol/L (ref 18–29)
CREATININE: 0.77 mg/dL (ref 0.57–1.00)
Calcium: 8.1 mg/dL — ABNORMAL LOW (ref 8.7–10.3)
Chloride: 103 mmol/L (ref 96–106)
GFR calc Af Amer: 94 mL/min/{1.73_m2} (ref >59)
GFR, EST NON AFRICAN AMERICAN: 81 mL/min/{1.73_m2} (ref >59)
GLUCOSE: 82 mg/dL (ref 65–99)
Potassium: 4.6 mmol/L (ref 3.5–5.2)
SODIUM: 141 mmol/L (ref 134–144)

## 2016-01-01 DIAGNOSIS — I5021 Acute systolic (congestive) heart failure: Secondary | ICD-10-CM | POA: Diagnosis not present

## 2016-01-02 ENCOUNTER — Other Ambulatory Visit: Payer: Self-pay | Admitting: Family Medicine

## 2016-01-02 MED ORDER — HYDROCODONE-ACETAMINOPHEN 10-325 MG PO TABS: 1.0000 | ORAL_TABLET | Freq: Three times a day (TID) | ORAL | 0 refills | Status: DC | PRN

## 2016-01-05 ENCOUNTER — Inpatient Hospital Stay: Payer: Medicare Other | Attending: Oncology
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
## 2016-01-09 ENCOUNTER — Inpatient Hospital Stay: Payer: Medicare Other

## 2016-01-09 ENCOUNTER — Inpatient Hospital Stay: Payer: Medicare Other | Admitting: Oncology

## 2016-01-15 ENCOUNTER — Other Ambulatory Visit: Payer: Self-pay | Admitting: Family Medicine

## 2016-01-18 ENCOUNTER — Encounter: Payer: Self-pay | Admitting: Family Medicine

## 2016-01-18 ENCOUNTER — Ambulatory Visit (INDEPENDENT_AMBULATORY_CARE_PROVIDER_SITE_OTHER): Payer: Medicare Other | Admitting: Family Medicine

## 2016-01-18 VITALS — BP 138/76 | HR 100 | Temp 98.6°F | Wt 127.0 lb

## 2016-01-18 DIAGNOSIS — L03116 Cellulitis of left lower limb: Secondary | ICD-10-CM

## 2016-01-18 DIAGNOSIS — G2581 Restless legs syndrome: Secondary | ICD-10-CM

## 2016-01-18 DIAGNOSIS — M7989 Other specified soft tissue disorders: Secondary | ICD-10-CM

## 2016-01-18 MED ORDER — TRAZODONE HCL 50 MG PO TABS: 25.0000 mg | ORAL_TABLET | Freq: Every evening | ORAL | 0 refills | Status: DC | PRN

## 2016-01-18 MED ORDER — CEPHALEXIN 500 MG PO CAPS: 500.0000 mg | ORAL_CAPSULE | Freq: Three times a day (TID) | ORAL | 0 refills | Status: DC

## 2016-01-18 NOTE — Assessment & Plan Note (Signed)
Could not take medicine. No change. In too much pain from the swelling now. Does not want to deal with the RLS right now.

## 2016-01-18 NOTE — Progress Notes (Signed)
BP 138/76 (BP Location: Right Arm, Patient Position: Sitting, Cuff Size: Normal)   Pulse 100   Temp 98.6 F (37 C)   Wt 127 lb (57.6 kg)   SpO2 97%   BMI 21.13 kg/m    Subjective:    Patient ID: [name], female    DOB: 1951-05-04, 65 y.o.   MRN: [id number]  HPI: [name] is a 65 y.o. female  Chief Complaint  Patient presents with  . Restless Leg Syndrome   RESTLESS LEGS- could not tolerate her medicine, made her dizzy, so she stopped it. Does not want to go back on gabaoentin right now. Too much going on with the swelling, doesn't want to deal with RLS now.   Has had swollen R leg for the past couple weeks. Has been hot off and on. Has been really painful. No fevers. No chills. Swelling has been off and on since she had her surgery. Has tried compression hose, ace wraps, lasix and elevation all with no benefit. Now hot and red again. Feeling really bad.   Having a really hard time sleeping. Would like something to help with that for a short time.   Relevant past medical, surgical, family and social history reviewed and updated as indicated. Interim medical history since our last visit reviewed. Allergies and medications reviewed and updated.  Review of Systems  Constitutional: Negative.   Respiratory: Negative.   Cardiovascular: Positive for leg swelling. Negative for chest pain and palpitations.  Musculoskeletal: Positive for arthralgias, back pain, joint swelling and myalgias. Negative for gait problem, neck pain and neck stiffness.  Psychiatric/Behavioral: Positive for dysphoric mood and sleep disturbance. Negative for agitation, behavioral problems, confusion, decreased concentration, hallucinations, self-injury and suicidal ideas. The patient is nervous/anxious. The patient is not hyperactive.     Per HPI unless specifically indicated above     Objective:    BP 138/76 (BP Location: Right Arm, Patient Position: Sitting, Cuff Size: Normal)   Pulse 100    Temp 98.6 F (37 C)   Wt 127 lb (57.6 kg)   SpO2 97%   BMI 21.13 kg/m   Wt Readings from Last 3 Encounters:  01/18/16 127 lb (57.6 kg)  12/28/15 125 lb (56.7 kg)  10/05/15 136 lb 14.5 oz (62.1 kg)    Physical Exam  Constitutional: She is oriented to person, place, and time. She appears well-developed and well-nourished. No distress.  HENT:  Head: Normocephalic and atraumatic.  Right Ear: Hearing normal.  Left Ear: Hearing normal.  Nose: Nose normal.  Eyes: Conjunctivae and lids are normal. Right eye exhibits no discharge. Left eye exhibits no discharge. No scleral icterus.  Cardiovascular: Normal rate, regular rhythm and intact distal pulses.  Exam reveals no gallop and no friction rub.   Murmur heard. Pulmonary/Chest: Effort normal and breath sounds normal. No respiratory distress. She has no wheezes. She has no rales.  Musculoskeletal: Normal range of motion.  3+ edema L leg with erythema and heat, 2+ edema R leg without erythema or heat  Neurological: She is alert and oriented to person, place, and time.  Skin: Skin is warm, dry and intact. No rash noted. No erythema. No pallor.  Psychiatric: She has a normal mood and affect. Her speech is normal and behavior is normal. Judgment and thought content normal. Cognition and memory are normal.    Results for orders placed or performed in visit on 12/28/15  Thyroid Panel With TSH  Result Value Ref Range   TSH  2.420 0.450 - 4.500 uIU/mL   T4, Total 5.0 4.5 - 12.0 ug/dL   T3 Uptake Ratio 28 24 - 39 %   Free Thyroxine Index 1.4 1.2 - 4.9  CBC with Differential/Platelet  Result Value Ref Range   WBC 9.2 3.4 - 10.8 x10E3/uL   RBC 4.56 3.77 - 5.28 x10E6/uL   Hemoglobin 12.8 11.1 - 15.9 g/dL   Hematocrit 41.1 34.0 - 46.6 %   MCV 90 79 - 97 fL   MCH 28.1 26.6 - 33.0 pg   MCHC 31.1 (L) 31.5 - 35.7 g/dL   RDW 17.8 (H) 12.3 - 15.4 %   Platelets 366 150 - 379 x10E3/uL   Neutrophils 62 %   Lymphs 26 %   Monocytes 7 %   Eos 5 %    Basos 0 %   Neutrophils Absolute 5.7 1.4 - 7.0 x10E3/uL   Lymphocytes Absolute 2.4 0.7 - 3.1 x10E3/uL   Monocytes Absolute 0.7 0.1 - 0.9 x10E3/uL   EOS (ABSOLUTE) 0.4 0.0 - 0.4 x10E3/uL   Basophils Absolute 0.0 0.0 - 0.2 x10E3/uL   Immature Granulocytes 0 %   Immature Grans (Abs) 0.0 0.0 - 0.1 [id number]  Basic metabolic panel  Result Value Ref Range   Glucose 82 65 - 99 mg/dL   BUN 10 8 - 27 mg/dL   Creatinine, Ser 0.77 0.57 - 1.00 mg/dL   GFR calc non Af Amer 81 >59 mL/min/1.73   GFR calc Af Amer 94 >59 mL/min/1.73   BUN/Creatinine Ratio 13 12 - 28   Sodium 141 134 - 144 mmol/L   Potassium 4.6 3.5 - 5.2 mmol/L   Chloride 103 96 - 106 mmol/L   CO2 24 18 - 29 mmol/L   Calcium 8.1 (L) 8.7 - 10.3 mg/dL      Assessment & Plan:   Problem List Items Addressed This Visit      Other   Swelling of lower extremity    Cardiology thinks that the legs may be due to chronic venous insufficency, per vascular, they do not think that her legs are due to a vascular cause. She is unable to wear compression hose. She has not been able to try ACE wraps. She has been elevating. Lasix has done no good for her. Will get her into the lymphedema clinic      Relevant Orders   Ambulatory referral to Occupational Therapy   RLS (restless legs syndrome)    Could not take medicine. No change. In too much pain from the swelling now. Does not want to deal with the RLS right now.        Other Visit Diagnoses    Cellulitis of left lower extremity    -  Primary   Will treat with keflex. Recheck 1 week.        Follow up plan: Return in about 1 week (around 01/25/2016) for recheck cellulitis.

## 2016-01-18 NOTE — Assessment & Plan Note (Addendum)
Cardiology thinks that the legs may be due to chronic venous insufficency, per vascular, they do not think that her legs are due to a vascular cause. She is unable to wear compression hose. She has not been able to try ACE wraps. She has been elevating. Lasix has done no good for her. Will get her into the lymphedema clinic

## 2016-01-21 DIAGNOSIS — Z602 Problems related to living alone: Secondary | ICD-10-CM | POA: Diagnosis not present

## 2016-01-21 DIAGNOSIS — L03116 Cellulitis of left lower limb: Secondary | ICD-10-CM | POA: Diagnosis not present

## 2016-01-21 DIAGNOSIS — G2581 Restless legs syndrome: Secondary | ICD-10-CM | POA: Diagnosis not present

## 2016-01-21 DIAGNOSIS — Z720 Tobacco use: Secondary | ICD-10-CM | POA: Diagnosis not present

## 2016-01-22 ENCOUNTER — Telehealth: Payer: Self-pay | Admitting: Family Medicine

## 2016-01-22 NOTE — Telephone Encounter (Signed)
Verbal from Dr.Johnson that this is ok.   Worker notified.

## 2016-01-22 NOTE — Telephone Encounter (Signed)
Lemuel from [facility] would like to get verbal orders for  -PT twice a week for 2 weeks then once a week for one week.   -occupational therapy 1 time a week for 1 week -Nurse aide 1 time a week for 1 week.

## 2016-01-23 ENCOUNTER — Other Ambulatory Visit: Payer: Self-pay | Admitting: Family Medicine

## 2016-01-23 MED ORDER — HYDROCODONE-ACETAMINOPHEN 10-325 MG PO TABS: 1.0000 | ORAL_TABLET | Freq: Three times a day (TID) | ORAL | 0 refills | Status: DC | PRN

## 2016-01-25 ENCOUNTER — Telehealth: Payer: Self-pay | Admitting: Family Medicine

## 2016-01-25 ENCOUNTER — Encounter: Payer: Self-pay | Admitting: Family Medicine

## 2016-01-25 ENCOUNTER — Ambulatory Visit (INDEPENDENT_AMBULATORY_CARE_PROVIDER_SITE_OTHER): Payer: Medicare Other | Admitting: Family Medicine

## 2016-01-25 VITALS — BP 132/77 | HR 99 | Temp 98.5°F | Ht 64.0 in | Wt 127.0 lb

## 2016-01-25 DIAGNOSIS — M7989 Other specified soft tissue disorders: Secondary | ICD-10-CM | POA: Diagnosis not present

## 2016-01-25 DIAGNOSIS — L03116 Cellulitis of left lower limb: Secondary | ICD-10-CM

## 2016-01-25 NOTE — Progress Notes (Signed)
BP 132/77 (BP Location: Right Arm, Patient Position: Sitting, Cuff Size: Normal)   Pulse 99   Temp 98.5 F (36.9 C)   Ht 5\' 4"  (1.626 m)   Wt 127 lb (57.6 kg)   SpO2 96%   BMI 21.80 kg/m    Subjective:    Patient ID: [name], female    DOB: April 26, 1951, 65 y.o.   MRN: [id number]  HPI: [name] is a 65 y.o. female  Chief Complaint  Patient presents with  . Cellulitis    recheck, she states it's about the same   States that legs are about the same, a bit better. Had them elevated last night and almost all of the swelling went down.  Having home health in, but very confused. Not sure why she needs a nurse visit or PT. Does not. Needs lymphedema clinic. Otherwise doing well. No other concerns or complaints at this time.   Relevant past medical, surgical, family and social history reviewed and updated as indicated. Interim medical history since our last visit reviewed. Allergies and medications reviewed and updated.  Review of Systems  Constitutional: Negative.   Respiratory: Negative.  Negative for wheezing.   Cardiovascular: Positive for leg swelling. Negative for chest pain and palpitations.  Psychiatric/Behavioral: Negative.     Per HPI unless specifically indicated above     Objective:    BP 132/77 (BP Location: Right Arm, Patient Position: Sitting, Cuff Size: Normal)   Pulse 99   Temp 98.5 F (36.9 C)   Ht 5\' 4"  (1.626 m)   Wt 127 lb (57.6 kg)   SpO2 96%   BMI 21.80 kg/m   Wt Readings from Last 3 Encounters:  01/25/16 127 lb (57.6 kg)  01/18/16 127 lb (57.6 kg)  12/28/15 125 lb (56.7 kg)    Physical Exam  Constitutional: She is oriented to person, place, and time. She appears well-developed and well-nourished. No distress.  HENT:  Head: Normocephalic and atraumatic.  Right Ear: Hearing normal.  Left Ear: Hearing normal.  Nose: Nose normal.  Eyes: Conjunctivae and lids are normal. Right eye exhibits no discharge. Left eye exhibits no  discharge. No scleral icterus.  Cardiovascular: Normal rate, regular rhythm and intact distal pulses.  Exam reveals no gallop and no friction rub.   Murmur heard. Pulmonary/Chest: Effort normal and breath sounds normal. No respiratory distress. She has no wheezes. She has no rales. She exhibits no tenderness.  Musculoskeletal: Normal range of motion. She exhibits edema (3+ on the L and 2+ on the R).  Significantly less red with less heat  Neurological: She is alert and oriented to person, place, and time.  Skin: Skin is warm, dry and intact. No rash noted. There is erythema. No pallor.  Psychiatric: She has a normal mood and affect. Her speech is normal and behavior is normal. Judgment and thought content normal. Cognition and memory are normal.  Nursing note and vitals reviewed.   Results for orders placed or performed in visit on 12/28/15  Thyroid Panel With TSH  Result Value Ref Range   TSH 2.420 0.450 - 4.500 uIU/mL   T4, Total 5.0 4.5 - 12.0 ug/dL   T3 Uptake Ratio 28 24 - 39 %   Free Thyroxine Index 1.4 1.2 - 4.9  CBC with Differential/Platelet  Result Value Ref Range   WBC 9.2 3.4 - 10.8 x10E3/uL   RBC 4.56 3.77 - 5.28 x10E6/uL   Hemoglobin 12.8 11.1 - 15.9 g/dL   Hematocrit 41.1  34.0 - 46.6 %   MCV 90 79 - 97 fL   MCH 28.1 26.6 - 33.0 pg   MCHC 31.1 (L) 31.5 - 35.7 g/dL   RDW 17.8 (H) 12.3 - 15.4 %   Platelets 366 150 - 379 x10E3/uL   Neutrophils 62 %   Lymphs 26 %   Monocytes 7 %   Eos 5 %   Basos 0 %   Neutrophils Absolute 5.7 1.4 - 7.0 x10E3/uL   Lymphocytes Absolute 2.4 0.7 - 3.1 x10E3/uL   Monocytes Absolute 0.7 0.1 - 0.9 x10E3/uL   EOS (ABSOLUTE) 0.4 0.0 - 0.4 x10E3/uL   Basophils Absolute 0.0 0.0 - 0.2 x10E3/uL   Immature Granulocytes 0 %   Immature Grans (Abs) 0.0 0.0 - 0.1 [id number]  Basic metabolic panel  Result Value Ref Range   Glucose 82 65 - 99 mg/dL   BUN 10 8 - 27 mg/dL   Creatinine, Ser 0.77 0.57 - 1.00 mg/dL   GFR calc non Af Amer 81 >59  mL/min/1.73   GFR calc Af Amer 94 >59 mL/min/1.73   BUN/Creatinine Ratio 13 12 - 28   Sodium 141 134 - 144 mmol/L   Potassium 4.6 3.5 - 5.2 mmol/L   Chloride 103 96 - 106 mmol/L   CO2 24 18 - 29 mmol/L   Calcium 8.1 (L) 8.7 - 10.3 mg/dL      Assessment & Plan:   Problem List Items Addressed This Visit      Other   Swelling of lower extremity    Still very swollen. Needs lyphedema clinic. Not home health. Will call and check in with them.        Other Visit Diagnoses    Cellulitis of left lower extremity    -  Primary   Improving. Still very swollen. Needs lyphedema clinic. Not home health. Will call and check in with them.        Follow up plan: Return 2 weeks, for Leg follow up.

## 2016-01-25 NOTE — Assessment & Plan Note (Signed)
Still very swollen. Needs lyphedema clinic. Not home health. Will call and check in with them.

## 2016-01-25 NOTE — Telephone Encounter (Signed)
[name] called from [facility] and would like to request nurse visits once a week for 3 weeks.

## 2016-01-26 NOTE — Telephone Encounter (Signed)
Notified Evelena Peat, that this patient does not need nurse visits.

## 2016-01-29 ENCOUNTER — Inpatient Hospital Stay: Payer: Medicare Other

## 2016-01-31 ENCOUNTER — Ambulatory Visit: Payer: Medicare Other | Admitting: Oncology

## 2016-01-31 ENCOUNTER — Ambulatory Visit: Payer: Medicare Other

## 2016-01-31 ENCOUNTER — Encounter: Payer: Self-pay | Admitting: Occupational Therapy

## 2016-01-31 ENCOUNTER — Ambulatory Visit: Payer: Medicare Other | Attending: Family Medicine | Admitting: Occupational Therapy

## 2016-01-31 DIAGNOSIS — I890 Lymphedema, not elsewhere classified: Secondary | ICD-10-CM | POA: Diagnosis not present
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
## 2016-01-31 NOTE — Patient Instructions (Signed)

## 2016-02-01 DIAGNOSIS — I5021 Acute systolic (congestive) heart failure: Secondary | ICD-10-CM | POA: Diagnosis not present

## 2016-02-05 ENCOUNTER — Ambulatory Visit: Payer: Medicare Other | Attending: Family Medicine | Admitting: Occupational Therapy

## 2016-02-05 DIAGNOSIS — I890 Lymphedema, not elsewhere classified: Secondary | ICD-10-CM | POA: Insufficient documentation

## 2016-02-05 NOTE — Patient Instructions (Signed)
LE instructions and precautions as established- see initial eval.   
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
## 2016-02-07 ENCOUNTER — Ambulatory Visit: Payer: Medicare Other | Admitting: Occupational Therapy

## 2016-02-07 DIAGNOSIS — I890 Lymphedema, not elsewhere classified: Secondary | ICD-10-CM | POA: Diagnosis not present

## 2016-02-07 NOTE — Patient Instructions (Signed)
LE instructions and precautions as established- see initial eval.   

## 2016-02-07 NOTE — Therapy (Signed)
[place] MAIN [facility] SERVICES [address] [place], [place], [address] Phone: [phone]   Fax:  [phone]  Patient Details  Name: [name] MRN: [id number] Date of Birth: 1950/10/30 Referring Provider:  Valerie Roys, DO  Encounter Date: 02/07/2016   Andrey Spearman, MS, OTR/L, [facility] 02/07/16 3:03 PM   [place] MAIN [facility] SERVICES [address] [place], [place], [address] Phone: [phone]   Fax:  [phone]

## 2016-02-08 ENCOUNTER — Ambulatory Visit (INDEPENDENT_AMBULATORY_CARE_PROVIDER_SITE_OTHER): Payer: Medicare Other | Admitting: Family Medicine

## 2016-02-08 ENCOUNTER — Encounter: Payer: Self-pay | Admitting: Family Medicine

## 2016-02-08 VITALS — BP 97/61 | HR 61 | Temp 98.8°F | Wt 129.0 lb

## 2016-02-08 DIAGNOSIS — M7989 Other specified soft tissue disorders: Secondary | ICD-10-CM | POA: Diagnosis not present

## 2016-02-08 DIAGNOSIS — Z23 Encounter for immunization: Secondary | ICD-10-CM

## 2016-02-08 NOTE — Assessment & Plan Note (Signed)
Continue to follow with lymphedema clinic. Continue to wrap. Call with any concerns.

## 2016-02-08 NOTE — Patient Instructions (Addendum)

## 2016-02-08 NOTE — Progress Notes (Signed)
BP 97/61 (BP Location: Right Arm, Cuff Size: Small)   Pulse 61   Temp 98.8 F (37.1 C)   Wt 129 lb (58.5 kg) Comment: with shoes  SpO2 97%   BMI 22.14 kg/m    Subjective:    Patient ID: [name], female    DOB: October 17, 1950, 65 y.o.   MRN: [id number]  HPI: [name] is a 65 y.o. female  Chief Complaint  Patient presents with  . follow up for Leg   Seeing the lymphedema clinic. They have been wrapping her leg. Feeling better doing that. Likes the OT. Not looking as red anymore. Looking better. Feeling better. She notes that she has been doing better, still with pain in her L leg at night when she's laying down. Otherwise doing OK. No other concerns or complaints at this time  Relevant past medical, surgical, family and social history reviewed and updated as indicated. Interim medical history since our last visit reviewed. Allergies and medications reviewed and updated.  Review of Systems  Constitutional: Negative.   Respiratory: Negative.   Cardiovascular: Negative.   Musculoskeletal: Negative.   Psychiatric/Behavioral: Negative.     Per HPI unless specifically indicated above     Objective:    BP 97/61 (BP Location: Right Arm, Cuff Size: Small)   Pulse 61   Temp 98.8 F (37.1 C)   Wt 129 lb (58.5 kg) Comment: with shoes  SpO2 97%   BMI 22.14 kg/m   Wt Readings from Last 3 Encounters:  02/08/16 129 lb (58.5 kg)  01/25/16 127 lb (57.6 kg)  01/18/16 127 lb (57.6 kg)    Physical Exam  Constitutional: She is oriented to person, place, and time. She appears well-developed and well-nourished. No distress.  HENT:  Head: Normocephalic and atraumatic.  Right Ear: Hearing normal.  Left Ear: Hearing normal.  Nose: Nose normal.  Eyes: Conjunctivae and lids are normal. Right eye exhibits no discharge. Left eye exhibits no discharge. No scleral icterus.  Cardiovascular: Normal rate, regular rhythm and intact distal pulses.  Exam reveals no gallop and no  friction rub.   Murmur heard. Pulmonary/Chest: Effort normal and breath sounds normal. No respiratory distress. She has no wheezes. She has no rales. She exhibits no tenderness.  Musculoskeletal: Normal range of motion. She exhibits edema (2+ edema R leg, 3+ edema L leg- in lymphedema wrap).  Neurological: She is alert and oriented to person, place, and time.  Skin: Skin is warm, dry and intact. No rash noted. No erythema. No pallor.  Psychiatric: She has a normal mood and affect. Her speech is normal and behavior is normal. Judgment and thought content normal. Cognition and memory are normal.  Nursing note and vitals reviewed.   Results for orders placed or performed in visit on 12/28/15  Thyroid Panel With TSH  Result Value Ref Range   TSH 2.420 0.450 - 4.500 uIU/mL   T4, Total 5.0 4.5 - 12.0 ug/dL   T3 Uptake Ratio 28 24 - 39 %   Free Thyroxine Index 1.4 1.2 - 4.9  CBC with Differential/Platelet  Result Value Ref Range   WBC 9.2 3.4 - 10.8 x10E3/uL   RBC 4.56 3.77 - 5.28 x10E6/uL   Hemoglobin 12.8 11.1 - 15.9 g/dL   Hematocrit 41.1 34.0 - 46.6 %   MCV 90 79 - 97 fL   MCH 28.1 26.6 - 33.0 pg   MCHC 31.1 (L) 31.5 - 35.7 g/dL   RDW 17.8 (H) 12.3 - 15.4 %  Platelets 366 150 - 379 x10E3/uL   Neutrophils 62 %   Lymphs 26 %   Monocytes 7 %   Eos 5 %   Basos 0 %   Neutrophils Absolute 5.7 1.4 - 7.0 x10E3/uL   Lymphocytes Absolute 2.4 0.7 - 3.1 x10E3/uL   Monocytes Absolute 0.7 0.1 - 0.9 x10E3/uL   EOS (ABSOLUTE) 0.4 0.0 - 0.4 x10E3/uL   Basophils Absolute 0.0 0.0 - 0.2 x10E3/uL   Immature Granulocytes 0 %   Immature Grans (Abs) 0.0 0.0 - 0.1 [id number]  Basic metabolic panel  Result Value Ref Range   Glucose 82 65 - 99 mg/dL   BUN 10 8 - 27 mg/dL   Creatinine, Ser 0.77 0.57 - 1.00 mg/dL   GFR calc non Af Amer 81 >59 mL/min/1.73   GFR calc Af Amer 94 >59 mL/min/1.73   BUN/Creatinine Ratio 13 12 - 28   Sodium 141 134 - 144 mmol/L   Potassium 4.6 3.5 - 5.2 mmol/L   Chloride  103 96 - 106 mmol/L   CO2 24 18 - 29 mmol/L   Calcium 8.1 (L) 8.7 - 10.3 mg/dL      Assessment & Plan:   Problem List Items Addressed This Visit      Other   Swelling of lower extremity - Primary    Continue to follow with lymphedema clinic. Continue to wrap. Call with any concerns.        Other Visit Diagnoses    Immunization due       Flu shot given today   Relevant Orders   Flu vaccine HIGH DOSE PF (Fluzone High dose)       Follow up plan: Return in about 4 weeks (around 03/07/2016) for 6 month follow up and shave bx (60min please).

## 2016-02-09 ENCOUNTER — Ambulatory Visit: Payer: Medicare Other | Admitting: Occupational Therapy

## 2016-02-12 ENCOUNTER — Ambulatory Visit: Payer: Medicare Other | Admitting: Occupational Therapy

## 2016-02-14 ENCOUNTER — Ambulatory Visit: Payer: Medicare Other | Admitting: Occupational Therapy

## 2016-02-14 DIAGNOSIS — I890 Lymphedema, not elsewhere classified: Secondary | ICD-10-CM
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
## 2016-02-14 NOTE — Patient Instructions (Signed)
LE instructions and precautions as established- see initial eval.   

## 2016-02-16 ENCOUNTER — Ambulatory Visit: Payer: Medicare Other | Admitting: Occupational Therapy

## 2016-02-16 DIAGNOSIS — I890 Lymphedema, not elsewhere classified: Secondary | ICD-10-CM
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
## 2016-02-16 NOTE — Patient Instructions (Signed)
LE instructions and precautions as established- see initial eval.   

## 2016-02-19 ENCOUNTER — Ambulatory Visit: Payer: Medicare Other | Admitting: Occupational Therapy

## 2016-02-19 DIAGNOSIS — I890 Lymphedema, not elsewhere classified: Secondary | ICD-10-CM

## 2016-02-19 NOTE — Patient Instructions (Signed)
LE instructions and precautions as established- see initial eval.   
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
## 2016-02-21 ENCOUNTER — Ambulatory Visit: Payer: Medicare Other | Admitting: Occupational Therapy

## 2016-02-21 DIAGNOSIS — I890 Lymphedema, not elsewhere classified: Secondary | ICD-10-CM

## 2016-02-21 NOTE — Patient Instructions (Signed)
LE instructions and precautions as established- see initial eval.   
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
## 2016-02-23 ENCOUNTER — Ambulatory Visit: Payer: Medicare Other | Admitting: Occupational Therapy

## 2016-02-26 ENCOUNTER — Ambulatory Visit: Payer: Medicare Other | Admitting: Occupational Therapy

## 2016-02-26 ENCOUNTER — Other Ambulatory Visit: Payer: Self-pay | Admitting: Family Medicine

## 2016-02-26 DIAGNOSIS — I890 Lymphedema, not elsewhere classified: Secondary | ICD-10-CM | POA: Diagnosis not present

## 2016-02-26 MED ORDER — HYDROCODONE-ACETAMINOPHEN 10-325 MG PO TABS: 1.0000 | ORAL_TABLET | Freq: Three times a day (TID) | ORAL | 0 refills | Status: DC | PRN
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
## 2016-02-26 NOTE — Patient Instructions (Signed)
LE instructions and precautions as established- see initial eval.   

## 2016-02-28 ENCOUNTER — Ambulatory Visit: Payer: Medicare Other | Admitting: Occupational Therapy

## 2016-02-28 DIAGNOSIS — I890 Lymphedema, not elsewhere classified: Secondary | ICD-10-CM | POA: Diagnosis not present
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
## 2016-02-28 NOTE — Patient Instructions (Signed)
LE instructions and precautions as established- see initial eval.   

## 2016-03-01 ENCOUNTER — Ambulatory Visit: Payer: Medicare Other | Admitting: Occupational Therapy

## 2016-03-02 DIAGNOSIS — I5021 Acute systolic (congestive) heart failure: Secondary | ICD-10-CM | POA: Diagnosis not present

## 2016-03-04 ENCOUNTER — Ambulatory Visit: Payer: Medicare Other | Admitting: Occupational Therapy

## 2016-03-06 ENCOUNTER — Ambulatory Visit: Payer: Medicare Other | Attending: Family Medicine | Admitting: Occupational Therapy

## 2016-03-06 DIAGNOSIS — I890 Lymphedema, not elsewhere classified: Secondary | ICD-10-CM | POA: Diagnosis not present
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
## 2016-03-06 NOTE — Patient Instructions (Signed)
LE instructions and precautions as established- see initial eval.   

## 2016-03-08 ENCOUNTER — Ambulatory Visit: Payer: Medicare Other | Admitting: Occupational Therapy

## 2016-03-11 ENCOUNTER — Ambulatory Visit: Payer: Medicare Other | Admitting: Occupational Therapy

## 2016-03-12 ENCOUNTER — Ambulatory Visit (INDEPENDENT_AMBULATORY_CARE_PROVIDER_SITE_OTHER): Payer: Medicare Other | Admitting: Family Medicine

## 2016-03-12 ENCOUNTER — Encounter: Payer: Self-pay | Admitting: Family Medicine

## 2016-03-12 ENCOUNTER — Other Ambulatory Visit: Payer: Self-pay | Admitting: Family Medicine

## 2016-03-12 VITALS — BP 117/66 | HR 100

## 2016-03-12 DIAGNOSIS — N289 Disorder of kidney and ureter, unspecified: Secondary | ICD-10-CM | POA: Diagnosis not present

## 2016-03-12 DIAGNOSIS — B078 Other viral warts: Secondary | ICD-10-CM | POA: Diagnosis not present

## 2016-03-12 DIAGNOSIS — D485 Neoplasm of uncertain behavior of skin: Secondary | ICD-10-CM | POA: Diagnosis not present

## 2016-03-12 DIAGNOSIS — E782 Mixed hyperlipidemia: Secondary | ICD-10-CM | POA: Diagnosis not present

## 2016-03-12 DIAGNOSIS — D500 Iron deficiency anemia secondary to blood loss (chronic): Secondary | ICD-10-CM

## 2016-03-12 DIAGNOSIS — I10 Essential (primary) hypertension: Secondary | ICD-10-CM

## 2016-03-12 LAB — MICROALBUMIN, URINE WAIVED
Creatinine, Urine Waived: 100 mg/dL (ref 10–300)
Microalb, Ur Waived: 80 mg/L — ABNORMAL HIGH (ref 0–19)

## 2016-03-12 NOTE — Assessment & Plan Note (Signed)
Rechecking levels today. Await results.  

## 2016-03-12 NOTE — Assessment & Plan Note (Signed)
Declines any statin. Rechecking levels today. Await results.

## 2016-03-12 NOTE — Assessment & Plan Note (Signed)
Under good control. Continue current regimen. Continue to monitor.  

## 2016-03-12 NOTE — Assessment & Plan Note (Signed)
Rechecking levels today. Await results. Continue to follow with hematology. 

## 2016-03-12 NOTE — Progress Notes (Signed)
BP 117/66 (BP Location: Left Arm, Patient Position: Sitting, Cuff Size: Normal)   Pulse 100    Subjective:    Patient ID: [name], female    DOB: 02/09/1951, 65 y.o.   MRN: [id number]  HPI: [name] is a 65 y.o. female  Chief Complaint  Patient presents with  . Hypertension  . Hyperlipidemia  . Anemia   HYPERTENSION / [place] Satisfied with current treatment? yes Duration of hypertension: chronic BP monitoring frequency: not checking BP medication side effects: no Duration of hyperlipidemia: chronic Cholesterol medication side effects: yes Cholesterol supplements: none Past cholesterol medications: atorvastatin, crestor Medication compliance: excellent compliance Aspirin: yes Recent stressors: no Recurrent headaches: no Visual changes: no Palpitations: no Dyspnea: no Chest pain: no Lower extremity edema: no Dizzy/lightheaded: no  ANEMIA Anemia status: stable Etiology of anemia: iron def Duration of anemia treatment: chronic  Compliance with treatment: excellent compliance Iron supplementation side effects: yes Severity of anemia: severe Fatigue: no Decreased exercise tolerance: no  Dyspnea on exertion: no Palpitations: no Bleeding: no Pica: no  SKIN LESION Duration: couples of years Location: R wrist Painful: no Itching: no Onset: gradual Context: scales over and then gets a scab, never goes away Associated signs and symptoms: none History of skin cancer: no History of precancerous skin lesions: yes Family history of skin cancer: yes  Relevant past medical, surgical, family and social history reviewed and updated as indicated. Interim medical history since our last visit reviewed. Allergies and medications reviewed and updated.  Review of Systems  Constitutional: Negative.   Respiratory: Negative.   Cardiovascular: Negative.   Skin: Negative.   Psychiatric/Behavioral: Negative.     Per HPI unless specifically indicated  above     Objective:    BP 117/66 (BP Location: Left Arm, Patient Position: Sitting, Cuff Size: Normal)   Pulse 100   Wt Readings from Last 3 Encounters:  02/08/16 129 lb (58.5 kg)  01/25/16 127 lb (57.6 kg)  01/18/16 127 lb (57.6 kg)    Physical Exam  Constitutional: She is oriented to person, place, and time. She appears well-developed and well-nourished. No distress.  HENT:  Head: Normocephalic and atraumatic.  Right Ear: Hearing normal.  Left Ear: Hearing normal.  Nose: Nose normal.  Eyes: Conjunctivae and lids are normal. Right eye exhibits no discharge. Left eye exhibits no discharge. No scleral icterus.  Cardiovascular: Normal rate, regular rhythm and intact distal pulses.  Exam reveals no gallop and no friction rub.   Murmur heard. Pulmonary/Chest: Effort normal. No respiratory distress. She has no wheezes. She has no rales. She exhibits no tenderness.  Musculoskeletal: Normal range of motion.  Neurological: She is alert and oriented to person, place, and time.  Skin: Skin is warm, dry and intact. No rash noted. No erythema. No pallor.  Cutaneous horn on the R palmar arm  Psychiatric: She has a normal mood and affect. Her speech is normal and behavior is normal. Judgment and thought content normal. Cognition and memory are normal.  Nursing note and vitals reviewed.   Results for orders placed or performed in visit on 12/28/15  Thyroid Panel With TSH  Result Value Ref Range   TSH 2.420 0.450 - 4.500 uIU/mL   T4, Total 5.0 4.5 - 12.0 ug/dL   T3 Uptake Ratio 28 24 - 39 %   Free Thyroxine Index 1.4 1.2 - 4.9  CBC with Differential/Platelet  Result Value Ref Range   WBC 9.2 3.4 - 10.8 x10E3/uL   RBC  4.56 3.77 - 5.28 x10E6/uL   Hemoglobin 12.8 11.1 - 15.9 g/dL   Hematocrit 41.1 34.0 - 46.6 %   MCV 90 79 - 97 fL   MCH 28.1 26.6 - 33.0 pg   MCHC 31.1 (L) 31.5 - 35.7 g/dL   RDW 17.8 (H) 12.3 - 15.4 %   Platelets 366 150 - 379 x10E3/uL   Neutrophils 62 %   Lymphs 26 %     Monocytes 7 %   Eos 5 %   Basos 0 %   Neutrophils Absolute 5.7 1.4 - 7.0 x10E3/uL   Lymphocytes Absolute 2.4 0.7 - 3.1 x10E3/uL   Monocytes Absolute 0.7 0.1 - 0.9 x10E3/uL   EOS (ABSOLUTE) 0.4 0.0 - 0.4 x10E3/uL   Basophils Absolute 0.0 0.0 - 0.2 x10E3/uL   Immature Granulocytes 0 %   Immature Grans (Abs) 0.0 0.0 - 0.1 [id number]  Basic metabolic panel  Result Value Ref Range   Glucose 82 65 - 99 mg/dL   BUN 10 8 - 27 mg/dL   Creatinine, Ser 0.77 0.57 - 1.00 mg/dL   GFR calc non Af Amer 81 >59 mL/min/1.73   GFR calc Af Amer 94 >59 mL/min/1.73   BUN/Creatinine Ratio 13 12 - 28   Sodium 141 134 - 144 mmol/L   Potassium 4.6 3.5 - 5.2 mmol/L   Chloride 103 96 - 106 mmol/L   CO2 24 18 - 29 mmol/L   Calcium 8.1 (L) 8.7 - 10.3 mg/dL      Assessment & Plan:   Problem List Items Addressed This Visit      Cardiovascular and Mediastinum   HTN (hypertension) - Primary    Under good control. Continue current regimen. Continue to monitor.         Genitourinary   Acute renal insufficiency    Rechecking levels today. Await results.         Other   Hyperlipidemia    Declines any statin. Rechecking levels today. Await results.       Iron deficiency anemia    Rechecking levels today. Await results. Continue to follow with hematology.       Other Visit Diagnoses    Neoplasm of uncertain behavior of skin       Shave biopsy done today as below. Sent for path. Await results.    Relevant Orders   Pathology Report      Skin Procedure: Shave biopsy  Procedure: Informed consent given.  Sterile prep of the area.  Area infiltrated with lidocaine with epinephrine.  Using a surgical blade, part of the upper dermis shaved off and sent  for pathology.  Area cauterized. Pt ed on scarring.     Diagnosis:   ICD-9-CM ICD-10-CM   1. Essential hypertension 401.9 I10 Comprehensive metabolic panel     Microalbumin, Urine Waived  2. Iron deficiency anemia due to chronic blood loss 280.0  D50.0 CBC with Differential/Platelet  3. Acute renal insufficiency 593.9 N28.9 Comprehensive metabolic panel  4. Mixed hyperlipidemia 272.2 E78.2 Comprehensive metabolic panel     Lipid Panel w/o Chol/HDL Ratio  5. Neoplasm of uncertain behavior of skin 238.2 D48.5 Pathology Report   Shave biopsy done today as below. Sent for path. Await results.     Lesion Location/Size: 1cm raised hard lesion in hyperpigmented area Physician: MJ Consent:  Risks, benefits, and alternative treatments discussed and all questions were answered.  Patient elected to proceed and verbal consent obtained.  Description: Area prepped and draped using semi-sterile technique. Area locally  anesthetized using 1.5 cc's of lidocaine 1% with epi. Shave biopsy of lesion performed using a #15 blade scalpel.  Adequate hemostastis achieved using Silver Nitrate  Post Procedure Instructions:  Wound care instructions discussed and patient was instructed to keep area clean and dry.  Signs and symptoms of infection discussed, patient agrees to contact the office ASAP should they occur.  Dressing change recommended every other day.   Follow up plan: Return in about 3 months (around 06/12/2016) for Pain follow up.

## 2016-03-13 ENCOUNTER — Ambulatory Visit: Payer: Medicare Other | Admitting: Occupational Therapy

## 2016-03-13 ENCOUNTER — Encounter: Payer: Self-pay | Admitting: Family Medicine

## 2016-03-13 DIAGNOSIS — I890 Lymphedema, not elsewhere classified: Secondary | ICD-10-CM

## 2016-03-13 LAB — COMPREHENSIVE METABOLIC PANEL
ALBUMIN: 3.2 g/dL — AB (ref 3.6–4.8)
ALT: 4 IU/L (ref 0–32)
AST: 14 IU/L (ref 0–40)
Albumin/Globulin Ratio: 1.7 (ref 1.2–2.2)
Alkaline Phosphatase: 73 IU/L (ref 39–117)
BUN / CREAT RATIO: 24 (ref 12–28)
BUN: 19 mg/dL (ref 8–27)
CALCIUM: 7.9 mg/dL — AB (ref 8.7–10.3)
CO2: 20 mmol/L (ref 18–29)
CREATININE: 0.79 mg/dL (ref 0.57–1.00)
Chloride: 109 mmol/L — ABNORMAL HIGH (ref 96–106)
GFR, EST AFRICAN AMERICAN: 90 mL/min/{1.73_m2} (ref >59)
GFR, EST NON AFRICAN AMERICAN: 78 mL/min/{1.73_m2} (ref >59)
GLUCOSE: 111 mg/dL — AB (ref 65–99)
Globulin, Total: 1.9 g/dL (ref 1.5–4.5)
Potassium: 4.6 mmol/L (ref 3.5–5.2)
Sodium: 144 mmol/L (ref 134–144)
TOTAL PROTEIN: 5.1 g/dL — AB (ref 6.0–8.5)

## 2016-03-13 LAB — CBC WITH DIFFERENTIAL/PLATELET
BASOS ABS: 0.1 10*3/uL (ref 0.0–0.2)
Basos: 1 %
EOS (ABSOLUTE): 0.9 10*3/uL — ABNORMAL HIGH (ref 0.0–0.4)
Eos: 9 %
HEMOGLOBIN: 11.3 g/dL (ref 11.1–15.9)
Hematocrit: 36 % (ref 34.0–46.6)
IMMATURE GRANS (ABS): 0 10*3/uL (ref 0.0–0.1)
IMMATURE GRANULOCYTES: 0 %
LYMPHS: 25 %
Lymphocytes Absolute: 2.4 10*3/uL (ref 0.7–3.1)
MCH: 27.6 pg (ref 26.6–33.0)
MCHC: 31.4 g/dL — ABNORMAL LOW (ref 31.5–35.7)
MCV: 88 fL (ref 79–97)
MONOCYTES: 6 %
Monocytes Absolute: 0.6 10*3/uL (ref 0.1–0.9)
NEUTROS ABS: 5.8 10*3/uL (ref 1.4–7.0)
NEUTROS PCT: 59 %
Platelets: 421 10*3/uL — ABNORMAL HIGH (ref 150–379)
RBC: 4.1 x10E6/uL (ref 3.77–5.28)
RDW: 16.4 % — ABNORMAL HIGH (ref 12.3–15.4)
WBC: 9.8 10*3/uL (ref 3.4–10.8)

## 2016-03-13 LAB — LIPID PANEL W/O CHOL/HDL RATIO
Cholesterol, Total: 193 mg/dL (ref 100–199)
HDL: 39 mg/dL — AB (ref >39)
LDL CALC: 102 mg/dL — AB (ref 0–99)
Triglycerides: 260 mg/dL — ABNORMAL HIGH (ref 0–149)
VLDL CHOLESTEROL CAL: 52 mg/dL — AB (ref 5–40)

## 2016-03-13 NOTE — Patient Instructions (Signed)
03-13-2016  Do not sleep in compression garments     During HOS.  LE instructions and precautions as established- see initial eval.
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
## 2016-03-14 ENCOUNTER — Telehealth: Payer: Self-pay | Admitting: Family Medicine

## 2016-03-14 LAB — PATHOLOGY

## 2016-03-14 NOTE — Telephone Encounter (Signed)
Called Devonta to let her know that her mole was just a mole.

## 2016-03-15 ENCOUNTER — Ambulatory Visit: Payer: Medicare Other | Admitting: Occupational Therapy

## 2016-03-15 DIAGNOSIS — I890 Lymphedema, not elsewhere classified: Secondary | ICD-10-CM | POA: Diagnosis not present
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
## 2016-03-15 NOTE — Patient Instructions (Signed)
LE instructions and precautions as established- see initial eval.   

## 2016-03-18 ENCOUNTER — Ambulatory Visit: Payer: Medicare Other | Admitting: Occupational Therapy

## 2016-03-18 DIAGNOSIS — I890 Lymphedema, not elsewhere classified: Secondary | ICD-10-CM | POA: Diagnosis not present
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
## 2016-03-19 ENCOUNTER — Ambulatory Visit: Payer: Medicare Other | Admitting: Occupational Therapy

## 2016-03-20 ENCOUNTER — Ambulatory Visit: Payer: Medicare Other | Admitting: Occupational Therapy

## 2016-03-22 ENCOUNTER — Ambulatory Visit: Payer: Medicare Other | Admitting: Occupational Therapy

## 2016-03-25 ENCOUNTER — Ambulatory Visit: Payer: Medicare Other | Admitting: Occupational Therapy

## 2016-03-27 ENCOUNTER — Ambulatory Visit: Payer: Medicare Other | Admitting: Occupational Therapy

## 2016-04-01 ENCOUNTER — Other Ambulatory Visit: Payer: Self-pay | Admitting: Family Medicine

## 2016-04-01 ENCOUNTER — Ambulatory Visit: Payer: Medicare Other | Admitting: Occupational Therapy

## 2016-04-01 DIAGNOSIS — I890 Lymphedema, not elsewhere classified: Secondary | ICD-10-CM | POA: Diagnosis not present

## 2016-04-01 MED ORDER — HYDROCODONE-ACETAMINOPHEN 10-325 MG PO TABS: 1.0000 | ORAL_TABLET | Freq: Three times a day (TID) | ORAL | 0 refills | Status: DC | PRN

## 2016-04-01 NOTE — Patient Instructions (Signed)
LE instructions and precautions as established- see initial eval.   
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
## 2016-04-02 DIAGNOSIS — I5021 Acute systolic (congestive) heart failure: Secondary | ICD-10-CM | POA: Diagnosis not present

## 2016-04-03 ENCOUNTER — Ambulatory Visit: Payer: Medicare Other | Admitting: Occupational Therapy

## 2016-04-05 ENCOUNTER — Ambulatory Visit: Payer: Medicare Other | Attending: Family Medicine | Admitting: Occupational Therapy

## 2016-04-05 DIAGNOSIS — I890 Lymphedema, not elsewhere classified: Secondary | ICD-10-CM | POA: Insufficient documentation

## 2016-04-05 NOTE — Patient Instructions (Signed)
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
## 2016-04-08 ENCOUNTER — Ambulatory Visit: Payer: Medicare Other | Admitting: Occupational Therapy

## 2016-04-10 ENCOUNTER — Ambulatory Visit: Payer: Medicare Other | Admitting: Occupational Therapy

## 2016-04-12 ENCOUNTER — Ambulatory Visit: Payer: Medicare Other | Admitting: Occupational Therapy

## 2016-04-15 ENCOUNTER — Ambulatory Visit: Payer: Medicare Other | Admitting: Occupational Therapy

## 2016-04-17 ENCOUNTER — Ambulatory Visit: Payer: Medicare Other | Admitting: Occupational Therapy

## 2016-04-19 ENCOUNTER — Ambulatory Visit: Payer: Medicare Other | Admitting: Occupational Therapy

## 2016-04-22 ENCOUNTER — Ambulatory Visit: Payer: Medicare Other | Admitting: Occupational Therapy

## 2016-04-22 ENCOUNTER — Other Ambulatory Visit: Payer: Self-pay | Admitting: Family Medicine

## 2016-04-22 MED ORDER — HYDROCODONE-ACETAMINOPHEN 10-325 MG PO TABS: 1.0000 | ORAL_TABLET | Freq: Three times a day (TID) | ORAL | 0 refills | Status: DC | PRN

## 2016-04-24 ENCOUNTER — Ambulatory Visit: Payer: Medicare Other | Admitting: Occupational Therapy

## 2016-04-26 ENCOUNTER — Ambulatory Visit: Payer: Medicare Other | Admitting: Occupational Therapy

## 2016-05-02 DIAGNOSIS — I5021 Acute systolic (congestive) heart failure: Secondary | ICD-10-CM | POA: Diagnosis not present

## 2016-05-24 ENCOUNTER — Ambulatory Visit: Payer: Medicare Other | Admitting: Family Medicine

## 2016-05-27 ENCOUNTER — Ambulatory Visit (INDEPENDENT_AMBULATORY_CARE_PROVIDER_SITE_OTHER): Payer: Self-pay | Admitting: Vascular Surgery

## 2016-05-27 ENCOUNTER — Encounter (INDEPENDENT_AMBULATORY_CARE_PROVIDER_SITE_OTHER): Payer: Medicare Other

## 2016-06-02 DIAGNOSIS — I5021 Acute systolic (congestive) heart failure: Secondary | ICD-10-CM | POA: Diagnosis not present

## 2016-06-04 ENCOUNTER — Other Ambulatory Visit: Payer: Self-pay | Admitting: Family Medicine

## 2016-06-04 DIAGNOSIS — Z1231 Encounter for screening mammogram for malignant neoplasm of breast: Secondary | ICD-10-CM

## 2016-06-05 ENCOUNTER — Encounter: Payer: Self-pay | Admitting: Family Medicine

## 2016-06-05 ENCOUNTER — Ambulatory Visit (INDEPENDENT_AMBULATORY_CARE_PROVIDER_SITE_OTHER): Payer: Medicare Other | Admitting: Family Medicine

## 2016-06-05 VITALS — BP 130/71 | HR 102 | Temp 98.2°F | Wt 131.0 lb

## 2016-06-05 DIAGNOSIS — E538 Deficiency of other specified B group vitamins: Secondary | ICD-10-CM | POA: Diagnosis not present

## 2016-06-05 DIAGNOSIS — D500 Iron deficiency anemia secondary to blood loss (chronic): Secondary | ICD-10-CM | POA: Diagnosis not present

## 2016-06-05 DIAGNOSIS — N63 Unspecified lump in unspecified breast: Secondary | ICD-10-CM

## 2016-06-05 DIAGNOSIS — E559 Vitamin D deficiency, unspecified: Secondary | ICD-10-CM | POA: Diagnosis not present

## 2016-06-05 DIAGNOSIS — E039 Hypothyroidism, unspecified: Secondary | ICD-10-CM

## 2016-06-05 DIAGNOSIS — R531 Weakness: Secondary | ICD-10-CM | POA: Diagnosis not present

## 2016-06-05 DIAGNOSIS — M1611 Unilateral primary osteoarthritis, right hip: Secondary | ICD-10-CM

## 2016-06-05 MED ORDER — HYDROCODONE-ACETAMINOPHEN 10-325 MG PO TABS: 1.0000 | ORAL_TABLET | Freq: Three times a day (TID) | ORAL | 0 refills | Status: DC | PRN

## 2016-06-05 NOTE — Assessment & Plan Note (Signed)
Stable on current medicine. Continue TID pain medicine.  

## 2016-06-05 NOTE — Assessment & Plan Note (Signed)
Rechecking levels today. 

## 2016-06-05 NOTE — Progress Notes (Signed)
BP 130/71   Pulse (!) 102   Temp 98.2 F (36.8 C)   Wt 131 lb (59.4 kg)   SpO2 97%   BMI 22.49 kg/m    Subjective:    Patient ID: [name], female    DOB: Feb 04, 1951, 66 y.o.   MRN: [id number]  HPI: [name] is a 66 y.o. female  Chief Complaint  Patient presents with  . Pain   Feels like her legs are not wanting to support her any more. She notes that her legs start trembling when she is walking and she has to sit down or she will fall. She can't walk a block. She gets cramping in her calves when she walks. She saw Dr. Lucky Cowboy and had ultrasounds. He didn't think that it was PAD. She notes that standing too long. Don't feel as heavy any more with the wraps. Lymphedema clinic is helping. She feels like she over did it around Christmas and that made her feel worse.   CHRONIC PAIN  Present dose: 30 Morphine equivalents Pain control status: stable Duration: chronic Location: bilateral hips R>L Quality: dull, aching and throbbing Current Pain Level: severe Previous Pain Level: moderate Breakthrough pain: yes Benefit from narcotic medications: yes What Activities task can be accomplished with current medication?: take care of herself, cook, move around her house Interested in weaning off narcotics:no   Stool softners/OTC fiber: yes  Previous pain specialty evaluation: no Non-narcotic analgesic meds: yes Narcotic contract: yes  Relevant past medical, surgical, family and social history reviewed and updated as indicated. Interim medical history since our last visit reviewed. Allergies and medications reviewed and updated.  Review of Systems  Respiratory: Negative.   Cardiovascular: Negative.   Musculoskeletal: Positive for arthralgias, gait problem and myalgias. Negative for back pain, joint swelling, neck pain and neck stiffness.  Neurological: Positive for weakness. Negative for dizziness, tremors, seizures, syncope, facial asymmetry, speech difficulty,  light-headedness, numbness and headaches.  Psychiatric/Behavioral: Negative.     Per HPI unless specifically indicated above     Objective:    BP 130/71   Pulse (!) 102   Temp 98.2 F (36.8 C)   Wt 131 lb (59.4 kg)   SpO2 97%   BMI 22.49 kg/m   Wt Readings from Last 3 Encounters:  06/05/16 131 lb (59.4 kg)  02/08/16 129 lb (58.5 kg)  01/25/16 127 lb (57.6 kg)    Physical Exam  Results for orders placed or performed in visit on 03/12/16  CBC with Differential/Platelet  Result Value Ref Range   WBC 9.8 3.4 - 10.8 x10E3/uL   RBC 4.10 3.77 - 5.28 x10E6/uL   Hemoglobin 11.3 11.1 - 15.9 g/dL   Hematocrit 36.0 34.0 - 46.6 %   MCV 88 79 - 97 fL   MCH 27.6 26.6 - 33.0 pg   MCHC 31.4 (L) 31.5 - 35.7 g/dL   RDW 16.4 (H) 12.3 - 15.4 %   Platelets 421 (H) 150 - 379 x10E3/uL   Neutrophils 59 Not Estab. %   Lymphs 25 Not Estab. %   Monocytes 6 Not Estab. %   Eos 9 Not Estab. %   Basos 1 Not Estab. %   Neutrophils Absolute 5.8 1.4 - 7.0 x10E3/uL   Lymphocytes Absolute 2.4 0.7 - 3.1 x10E3/uL   Monocytes Absolute 0.6 0.1 - 0.9 x10E3/uL   EOS (ABSOLUTE) 0.9 (H) 0.0 - 0.4 x10E3/uL   Basophils Absolute 0.1 0.0 - 0.2 x10E3/uL   Immature Granulocytes 0 Not Estab. %  Immature Grans (Abs) 0.0 0.0 - 0.1 x10E3/uL  Comprehensive metabolic panel  Result Value Ref Range   Glucose 111 (H) 65 - 99 mg/dL   BUN 19 8 - 27 mg/dL   Creatinine, Ser 0.79 0.57 - 1.00 mg/dL   GFR calc non Af Amer 78 >59 mL/min/1.73   GFR calc Af Amer 90 >59 mL/min/1.73   BUN/Creatinine Ratio 24 12 - 28   Sodium 144 134 - 144 mmol/L   Potassium 4.6 3.5 - 5.2 mmol/L   Chloride 109 (H) 96 - 106 mmol/L   CO2 20 18 - 29 mmol/L   Calcium 7.9 (L) 8.7 - 10.3 mg/dL   Total Protein 5.1 (L) 6.0 - 8.5 g/dL   Albumin 3.2 (L) 3.6 - 4.8 g/dL   Globulin, Total 1.9 1.5 - 4.5 g/dL   Albumin/Globulin Ratio 1.7 1.2 - 2.2   Bilirubin Total <0.2 0.0 - 1.2 mg/dL   Alkaline Phosphatase 73 39 - 117 IU/L   AST 14 0 - 40 IU/L   ALT  4 0 - 32 IU/L  Lipid Panel w/o Chol/HDL Ratio  Result Value Ref Range   Cholesterol, Total 193 100 - 199 mg/dL   Triglycerides 260 (H) 0 - 149 mg/dL   HDL 39 (L) >39 mg/dL   VLDL Cholesterol Cal 52 (H) 5 - 40 mg/dL   LDL Calculated 102 (H) 0 - 99 mg/dL  Microalbumin, Urine Waived  Result Value Ref Range   Microalb, Ur Waived 80 (H) 0 - 19 mg/L   Creatinine, Urine Waived 100 10 - 300 mg/dL   Microalb/Creat Ratio 30-300 (H) <30 mg/g  Pathology Report  Result Value Ref Range   PATH REPORT.SITE OF ORIGIN SPEC Comment    . Comment    PATH REPORT.FINAL DX SPEC Comment    SIGNED OUT BY: Comment    GROSS DESCRIPTION: Comment    . Comment    PAYMENT PROCEDURE Comment       Assessment & Plan:   Problem List Items Addressed This Visit      Endocrine   Hypothyroidism    Rechecking levels today.      Relevant Orders   TSH     Musculoskeletal and Integument   Osteoarthritis of right hip    Stable on current medicine. Continue TID pain medicine.       Relevant Medications   HYDROcodone-acetaminophen (NORCO) 10-325 MG tablet     Other   Iron deficiency anemia    Rechecking levels today.      Relevant Orders   CBC with Differential/Platelet   Iron and TIBC   Ferritin    Other Visit Diagnoses    Weakness    -  Primary   Will check labs to look for cause. May be due to deconditioning- if normal, will get PT in.    Vitamin D deficiency       Rechecking levels today.   Relevant Orders   VITAMIN D 25 Hydroxy (Vit-D Deficiency, Fractures)   B12 deficiency       Rechecking levels today.   Relevant Orders   B12 and Folate Panel   Lump of breast       Diagnostic mammogram ordered. Will get it done.    Relevant Orders   MM Digital Diagnostic Bilat   US BREAST COMPLETE UNI RIGHT INC AXILLA   US BREAST COMPLETE UNI LEFT INC AXILLA       Follow up plan: Return Pending results.[name]

## 2016-06-07 ENCOUNTER — Telehealth: Payer: Self-pay | Admitting: Family Medicine

## 2016-06-07 DIAGNOSIS — E039 Hypothyroidism, unspecified: Secondary | ICD-10-CM

## 2016-06-07 DIAGNOSIS — E559 Vitamin D deficiency, unspecified: Secondary | ICD-10-CM | POA: Insufficient documentation

## 2016-06-07 DIAGNOSIS — D509 Iron deficiency anemia, unspecified: Secondary | ICD-10-CM

## 2016-06-07 LAB — TSH: TSH: 0.129 u[IU]/mL — ABNORMAL LOW (ref 0.450–4.500)

## 2016-06-07 LAB — IRON AND TIBC
IRON SATURATION: 5 % — AB (ref 15–55)
Iron: 14 ug/dL — ABNORMAL LOW (ref 27–139)
Total Iron Binding Capacity: 266 ug/dL (ref 250–450)
UIBC: 252 ug/dL (ref 118–369)

## 2016-06-07 LAB — CBC WITH DIFFERENTIAL/PLATELET
Basophils Absolute: 0.1 10*3/uL (ref 0.0–0.2)
Basos: 1 %
EOS (ABSOLUTE): 0.7 10*3/uL — ABNORMAL HIGH (ref 0.0–0.4)
EOS: 7 %
HEMATOCRIT: 33.4 % — AB (ref 34.0–46.6)
HEMOGLOBIN: 9.7 g/dL — AB (ref 11.1–15.9)
IMMATURE GRANS (ABS): 0.1 10*3/uL (ref 0.0–0.1)
Immature Granulocytes: 1 %
LYMPHS ABS: 1.9 10*3/uL (ref 0.7–3.1)
Lymphs: 21 %
MCH: 24.6 pg — AB (ref 26.6–33.0)
MCHC: 29 g/dL — AB (ref 31.5–35.7)
MCV: 85 fL (ref 79–97)
MONOCYTES: 7 %
Monocytes Absolute: 0.6 10*3/uL (ref 0.1–0.9)
NEUTROS ABS: 5.6 10*3/uL (ref 1.4–7.0)
Neutrophils: 63 %
Platelets: 621 10*3/uL — ABNORMAL HIGH (ref 150–379)
RBC: 3.95 x10E6/uL (ref 3.77–5.28)
RDW: 18.2 % — ABNORMAL HIGH (ref 12.3–15.4)
WBC: 9 10*3/uL (ref 3.4–10.8)

## 2016-06-07 LAB — FERRITIN: FERRITIN: 7 ng/mL — AB (ref 15–150)

## 2016-06-07 LAB — B12 AND FOLATE PANEL
Folate: 4.1 ng/mL (ref >3.0)
VITAMIN B 12: 391 pg/mL (ref 232–1245)

## 2016-06-07 LAB — VITAMIN D 25 HYDROXY (VIT D DEFICIENCY, FRACTURES)

## 2016-06-07 MED ORDER — VITAMIN D (ERGOCALCIFEROL) 1.25 MG (50000 UNIT) PO CAPS: 50000.0000 [IU] | ORAL_CAPSULE | ORAL | 0 refills | Status: DC

## 2016-06-07 MED ORDER — LEVOTHYROXINE SODIUM 75 MCG PO TABS: ORAL_TABLET | ORAL | 2 refills | Status: DC

## 2016-06-07 NOTE — Telephone Encounter (Signed)
Yes, it's easier just to enter in a new referral. [place] [place] high priority if need too.

## 2016-06-07 NOTE — Telephone Encounter (Signed)
[name] with her results. Very low vitamin D- will supplement. Over-treated thyroid- will decrease dose to 1mcg and recheck in at follow up. Very low iron again- needs to get back into iron infusion.   Please set patient up with hematology and let me know if they need a new referral. She last saw them in either April or June
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
## 2016-06-17 ENCOUNTER — Inpatient Hospital Stay: Payer: Medicare Other | Attending: Oncology | Admitting: Oncology

## 2016-06-17 VITALS — BP 119/75 | HR 101 | Temp 99.2°F | Resp 18 | Wt 135.1 lb

## 2016-06-17 DIAGNOSIS — I110 Hypertensive heart disease with heart failure: Secondary | ICD-10-CM | POA: Diagnosis not present

## 2016-06-17 DIAGNOSIS — I509 Heart failure, unspecified: Secondary | ICD-10-CM | POA: Diagnosis not present

## 2016-06-17 DIAGNOSIS — F17210 Nicotine dependence, cigarettes, uncomplicated: Secondary | ICD-10-CM | POA: Insufficient documentation

## 2016-06-17 DIAGNOSIS — I429 Cardiomyopathy, unspecified: Secondary | ICD-10-CM | POA: Diagnosis not present

## 2016-06-17 DIAGNOSIS — D509 Iron deficiency anemia, unspecified: Secondary | ICD-10-CM | POA: Insufficient documentation

## 2016-06-17 DIAGNOSIS — D500 Iron deficiency anemia secondary to blood loss (chronic): Secondary | ICD-10-CM

## 2016-06-17 DIAGNOSIS — I252 Old myocardial infarction: Secondary | ICD-10-CM | POA: Insufficient documentation

## 2016-06-17 DIAGNOSIS — E785 Hyperlipidemia, unspecified: Secondary | ICD-10-CM | POA: Diagnosis not present

## 2016-06-17 DIAGNOSIS — K219 Gastro-esophageal reflux disease without esophagitis: Secondary | ICD-10-CM | POA: Diagnosis not present

## 2016-06-17 DIAGNOSIS — Z8711 Personal history of peptic ulcer disease: Secondary | ICD-10-CM | POA: Diagnosis not present

## 2016-06-17 DIAGNOSIS — E039 Hypothyroidism, unspecified: Secondary | ICD-10-CM | POA: Diagnosis not present

## 2016-06-17 DIAGNOSIS — Z7982 Long term (current) use of aspirin: Secondary | ICD-10-CM | POA: Insufficient documentation

## 2016-06-17 DIAGNOSIS — F329 Major depressive disorder, single episode, unspecified: Secondary | ICD-10-CM | POA: Diagnosis not present

## 2016-06-17 DIAGNOSIS — Z9981 Dependence on supplemental oxygen: Secondary | ICD-10-CM | POA: Insufficient documentation

## 2016-06-17 DIAGNOSIS — Z79899 Other long term (current) drug therapy: Secondary | ICD-10-CM | POA: Diagnosis not present

## 2016-06-17 NOTE — Progress Notes (Signed)
Patient is here for follow up, she is doing ok. She does mention pain in her legs.

## 2016-06-19 ENCOUNTER — Inpatient Hospital Stay: Payer: Medicare Other

## 2016-06-19 VITALS — BP 126/78 | HR 90 | Temp 98.1°F | Resp 20

## 2016-06-19 DIAGNOSIS — D509 Iron deficiency anemia, unspecified: Secondary | ICD-10-CM | POA: Diagnosis not present

## 2016-06-19 DIAGNOSIS — D500 Iron deficiency anemia secondary to blood loss (chronic): Secondary | ICD-10-CM

## 2016-06-19 DIAGNOSIS — E785 Hyperlipidemia, unspecified: Secondary | ICD-10-CM | POA: Diagnosis not present

## 2016-06-19 DIAGNOSIS — K219 Gastro-esophageal reflux disease without esophagitis: Secondary | ICD-10-CM | POA: Diagnosis not present

## 2016-06-19 DIAGNOSIS — E039 Hypothyroidism, unspecified: Secondary | ICD-10-CM | POA: Diagnosis not present

## 2016-06-19 DIAGNOSIS — I509 Heart failure, unspecified: Secondary | ICD-10-CM | POA: Diagnosis not present

## 2016-06-19 DIAGNOSIS — I110 Hypertensive heart disease with heart failure: Secondary | ICD-10-CM | POA: Diagnosis not present

## 2016-06-19 DIAGNOSIS — F17210 Nicotine dependence, cigarettes, uncomplicated: Secondary | ICD-10-CM | POA: Diagnosis not present

## 2016-06-19 DIAGNOSIS — I429 Cardiomyopathy, unspecified: Secondary | ICD-10-CM | POA: Diagnosis not present

## 2016-06-19 DIAGNOSIS — I252 Old myocardial infarction: Secondary | ICD-10-CM | POA: Diagnosis not present

## 2016-06-19 DIAGNOSIS — Z79899 Other long term (current) drug therapy: Secondary | ICD-10-CM | POA: Diagnosis not present

## 2016-06-19 MED ORDER — SODIUM CHLORIDE 0.9 % IV SOLN
Freq: Once | INTRAVENOUS | Status: AC
  Administered 2016-06-19: 14:00:00 via INTRAVENOUS
  Filled 2016-06-19: qty 1000

## 2016-06-19 MED ORDER — SODIUM CHLORIDE 0.9 % IV SOLN
510.0000 mg | Freq: Once | INTRAVENOUS | Status: AC
  Administered 2016-06-19: 510 mg via INTRAVENOUS
  Filled 2016-06-19: qty 17

## 2016-06-25 ENCOUNTER — Inpatient Hospital Stay: Payer: Medicare Other

## 2016-06-25 VITALS — BP 100/63 | HR 90 | Temp 97.0°F | Resp 20

## 2016-06-25 DIAGNOSIS — I509 Heart failure, unspecified: Secondary | ICD-10-CM | POA: Diagnosis not present

## 2016-06-25 DIAGNOSIS — F17210 Nicotine dependence, cigarettes, uncomplicated: Secondary | ICD-10-CM | POA: Diagnosis not present

## 2016-06-25 DIAGNOSIS — K219 Gastro-esophageal reflux disease without esophagitis: Secondary | ICD-10-CM | POA: Diagnosis not present

## 2016-06-25 DIAGNOSIS — D500 Iron deficiency anemia secondary to blood loss (chronic): Secondary | ICD-10-CM

## 2016-06-25 DIAGNOSIS — Z79899 Other long term (current) drug therapy: Secondary | ICD-10-CM | POA: Diagnosis not present

## 2016-06-25 DIAGNOSIS — E785 Hyperlipidemia, unspecified: Secondary | ICD-10-CM | POA: Diagnosis not present

## 2016-06-25 DIAGNOSIS — I429 Cardiomyopathy, unspecified: Secondary | ICD-10-CM | POA: Diagnosis not present

## 2016-06-25 DIAGNOSIS — E039 Hypothyroidism, unspecified: Secondary | ICD-10-CM | POA: Diagnosis not present

## 2016-06-25 DIAGNOSIS — I110 Hypertensive heart disease with heart failure: Secondary | ICD-10-CM | POA: Diagnosis not present

## 2016-06-25 DIAGNOSIS — D509 Iron deficiency anemia, unspecified: Secondary | ICD-10-CM | POA: Diagnosis not present

## 2016-06-25 DIAGNOSIS — I252 Old myocardial infarction: Secondary | ICD-10-CM | POA: Diagnosis not present

## 2016-06-25 MED ORDER — SODIUM CHLORIDE 0.9 % IV SOLN
510.0000 mg | Freq: Once | INTRAVENOUS | Status: AC
  Administered 2016-06-25: 510 mg via INTRAVENOUS
  Filled 2016-06-25: qty 17

## 2016-06-25 MED ORDER — SODIUM CHLORIDE 0.9 % IV SOLN
Freq: Once | INTRAVENOUS | Status: AC
  Administered 2016-06-25: 14:00:00 via INTRAVENOUS
  Filled 2016-06-25: qty 1000

## 2016-07-01 ENCOUNTER — Other Ambulatory Visit: Payer: Self-pay | Admitting: Family Medicine

## 2016-07-01 MED ORDER — HYDROCODONE-ACETAMINOPHEN 10-325 MG PO TABS: 1.0000 | ORAL_TABLET | Freq: Three times a day (TID) | ORAL | 0 refills | Status: DC | PRN

## 2016-07-03 DIAGNOSIS — I5021 Acute systolic (congestive) heart failure: Secondary | ICD-10-CM | POA: Diagnosis not present

## 2016-07-09 ENCOUNTER — Ambulatory Visit (INDEPENDENT_AMBULATORY_CARE_PROVIDER_SITE_OTHER): Payer: Medicare Other | Admitting: Cardiovascular Disease

## 2016-07-09 ENCOUNTER — Encounter: Payer: Self-pay | Admitting: Cardiovascular Disease

## 2016-07-09 VITALS — BP 126/70 | HR 90 | Ht 65.0 in | Wt 137.5 lb

## 2016-07-09 DIAGNOSIS — I6523 Occlusion and stenosis of bilateral carotid arteries: Secondary | ICD-10-CM

## 2016-07-09 DIAGNOSIS — I5033 Acute on chronic diastolic (congestive) heart failure: Secondary | ICD-10-CM | POA: Diagnosis not present

## 2016-07-09 DIAGNOSIS — E782 Mixed hyperlipidemia: Secondary | ICD-10-CM

## 2016-07-09 DIAGNOSIS — I739 Peripheral vascular disease, unspecified: Secondary | ICD-10-CM

## 2016-07-09 DIAGNOSIS — M7989 Other specified soft tissue disorders: Secondary | ICD-10-CM | POA: Diagnosis not present

## 2016-07-09 DIAGNOSIS — I2510 Atherosclerotic heart disease of native coronary artery without angina pectoris: Secondary | ICD-10-CM

## 2016-07-09 DIAGNOSIS — I10 Essential (primary) hypertension: Secondary | ICD-10-CM | POA: Diagnosis not present

## 2016-07-09 DIAGNOSIS — Z720 Tobacco use: Secondary | ICD-10-CM
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
## 2016-07-09 NOTE — Patient Instructions (Addendum)
Medication Instructions:   Please restart lasix daily with potassium Compression hose, leg elevation, ACE wrap   Labwork:  No new labs needed  Testing/Procedures:  No further testing at this time   I recommend watching educational videos on topics of interest to you at:       [email or web address]  Enter code: [email or web address]    Follow-Up: It was a pleasure seeing you in the office today. Please call us if you have new issues that need to be addressed before your next appt.  [phone]  Your physician wants you to follow-up in: 6 months.  You will receive a reminder letter in the mail two months in advance. If you don't receive a letter, please call our office to schedule the follow-up appointment.  If you need a refill on your cardiac medications before your next appointment, please call your pharmacy.

## 2016-07-31 ENCOUNTER — Other Ambulatory Visit: Payer: Self-pay | Admitting: Family Medicine

## 2016-07-31 DIAGNOSIS — I5021 Acute systolic (congestive) heart failure: Secondary | ICD-10-CM | POA: Diagnosis not present

## 2016-07-31 MED ORDER — HYDROCODONE-ACETAMINOPHEN 10-325 MG PO TABS: 1.0000 | ORAL_TABLET | Freq: Three times a day (TID) | ORAL | 0 refills | Status: DC | PRN

## 2016-08-06 ENCOUNTER — Other Ambulatory Visit: Payer: Self-pay | Admitting: Family Medicine

## 2016-08-06 MED ORDER — HYDROCODONE-ACETAMINOPHEN 10-325 MG PO TABS: 1.0000 | ORAL_TABLET | Freq: Three times a day (TID) | ORAL | 0 refills | Status: DC | PRN

## 2016-08-15 ENCOUNTER — Other Ambulatory Visit: Payer: Medicare Other

## 2016-08-15 ENCOUNTER — Ambulatory Visit: Payer: Medicare Other | Admitting: Oncology

## 2016-08-15 ENCOUNTER — Ambulatory Visit: Payer: Medicare Other
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
## 2016-08-21 ENCOUNTER — Inpatient Hospital Stay (HOSPITAL_BASED_OUTPATIENT_CLINIC_OR_DEPARTMENT_OTHER): Payer: Medicare Other | Admitting: Oncology

## 2016-08-21 ENCOUNTER — Inpatient Hospital Stay: Payer: Medicare Other

## 2016-08-21 ENCOUNTER — Inpatient Hospital Stay: Payer: Medicare Other | Attending: Oncology

## 2016-08-21 VITALS — BP 136/77 | HR 98 | Temp 99.1°F | Resp 18 | Wt 134.3 lb

## 2016-08-21 DIAGNOSIS — Z79899 Other long term (current) drug therapy: Secondary | ICD-10-CM | POA: Diagnosis not present

## 2016-08-21 DIAGNOSIS — F17210 Nicotine dependence, cigarettes, uncomplicated: Secondary | ICD-10-CM | POA: Insufficient documentation

## 2016-08-21 DIAGNOSIS — D500 Iron deficiency anemia secondary to blood loss (chronic): Secondary | ICD-10-CM

## 2016-08-21 DIAGNOSIS — G8929 Other chronic pain: Secondary | ICD-10-CM

## 2016-08-21 DIAGNOSIS — K219 Gastro-esophageal reflux disease without esophagitis: Secondary | ICD-10-CM

## 2016-08-21 DIAGNOSIS — M2550 Pain in unspecified joint: Secondary | ICD-10-CM | POA: Diagnosis not present

## 2016-08-21 DIAGNOSIS — Z7982 Long term (current) use of aspirin: Secondary | ICD-10-CM | POA: Diagnosis not present

## 2016-08-21 DIAGNOSIS — D509 Iron deficiency anemia, unspecified: Secondary | ICD-10-CM

## 2016-08-21 DIAGNOSIS — I252 Old myocardial infarction: Secondary | ICD-10-CM | POA: Insufficient documentation

## 2016-08-21 DIAGNOSIS — I429 Cardiomyopathy, unspecified: Secondary | ICD-10-CM | POA: Diagnosis not present

## 2016-08-21 DIAGNOSIS — I509 Heart failure, unspecified: Secondary | ICD-10-CM | POA: Diagnosis not present

## 2016-08-21 DIAGNOSIS — Z8711 Personal history of peptic ulcer disease: Secondary | ICD-10-CM | POA: Insufficient documentation

## 2016-08-21 DIAGNOSIS — E039 Hypothyroidism, unspecified: Secondary | ICD-10-CM | POA: Diagnosis not present

## 2016-08-21 DIAGNOSIS — F329 Major depressive disorder, single episode, unspecified: Secondary | ICD-10-CM | POA: Insufficient documentation

## 2016-08-21 DIAGNOSIS — I10 Essential (primary) hypertension: Secondary | ICD-10-CM | POA: Diagnosis not present

## 2016-08-21 DIAGNOSIS — E785 Hyperlipidemia, unspecified: Secondary | ICD-10-CM | POA: Diagnosis not present

## 2016-08-21 DIAGNOSIS — Z9981 Dependence on supplemental oxygen: Secondary | ICD-10-CM | POA: Insufficient documentation

## 2016-08-21 DIAGNOSIS — M545 Low back pain: Secondary | ICD-10-CM

## 2016-08-21 LAB — CBC WITH DIFFERENTIAL/PLATELET
Basophils Absolute: 0.1 10*3/uL (ref 0–0.1)
Basophils Relative: 1 %
EOS ABS: 0.6 10*3/uL (ref 0–0.7)
Eosinophils Relative: 6 %
HCT: 38.8 % (ref 35.0–47.0)
Hemoglobin: 13 g/dL (ref 12.0–16.0)
LYMPHS ABS: 2.7 10*3/uL (ref 1.0–3.6)
Lymphocytes Relative: 28 %
MCH: 29.7 pg (ref 26.0–34.0)
MCHC: 33.4 g/dL (ref 32.0–36.0)
MCV: 88.9 fL (ref 80.0–100.0)
MONO ABS: 0.7 10*3/uL (ref 0.2–0.9)
MONOS PCT: 8 %
NEUTROS PCT: 57 %
Neutro Abs: 5.6 10*3/uL (ref 1.4–6.5)
Platelets: 382 10*3/uL (ref 150–440)
RBC: 4.36 MIL/uL (ref 3.80–5.20)
RDW: 22.6 % — AB (ref 11.5–14.5)
WBC: 9.7 10*3/uL (ref 3.6–11.0)

## 2016-08-21 LAB — FERRITIN: Ferritin: 8 ng/mL — ABNORMAL LOW (ref 11–307)

## 2016-08-21 LAB — IRON AND TIBC
IRON: 17 ug/dL — AB (ref 28–170)
Saturation Ratios: 7 % — ABNORMAL LOW (ref 10.4–31.8)
TIBC: 261 ug/dL (ref 250–450)
UIBC: 244 ug/dL

## 2016-08-21 NOTE — Progress Notes (Signed)
Offers no complaints. States is feeling well. 

## 2016-08-31 DIAGNOSIS — I5021 Acute systolic (congestive) heart failure: Secondary | ICD-10-CM | POA: Diagnosis not present

## 2016-09-04 ENCOUNTER — Other Ambulatory Visit: Payer: Self-pay | Admitting: Family Medicine

## 2016-09-04 DIAGNOSIS — Z79899 Other long term (current) drug therapy: Secondary | ICD-10-CM

## 2016-09-04 MED ORDER — HYDROCODONE-ACETAMINOPHEN 10-325 MG PO TABS: 1.0000 | ORAL_TABLET | Freq: Three times a day (TID) | ORAL | 0 refills | Status: DC | PRN

## 2016-09-06 ENCOUNTER — Telehealth: Payer: Self-pay | Admitting: Family Medicine

## 2016-09-06 MED ORDER — LORAZEPAM 0.5 MG PO TABS: 0.5000 mg | ORAL_TABLET | Freq: Two times a day (BID) | ORAL | 0 refills | Status: DC | PRN

## 2016-09-06 NOTE — Telephone Encounter (Signed)
OK to call in Rx. Thanks!

## 2016-09-06 NOTE — Telephone Encounter (Signed)
Called into [facility]. 

## 2016-09-06 NOTE — Telephone Encounter (Signed)
Patient called in regards to requesting a prescription for anxiety due to the recent loss of her sister. Patient was hysterical when speaking. Please Advise.  Patient contact number: [phone]  Thank you.

## 2016-09-25 ENCOUNTER — Other Ambulatory Visit: Payer: Self-pay | Admitting: Family Medicine

## 2016-09-25 ENCOUNTER — Other Ambulatory Visit: Payer: Medicare Other

## 2016-09-25 DIAGNOSIS — R399 Unspecified symptoms and signs involving the genitourinary system: Secondary | ICD-10-CM

## 2016-09-25 DIAGNOSIS — Z79899 Other long term (current) drug therapy: Secondary | ICD-10-CM | POA: Diagnosis not present

## 2016-09-26 ENCOUNTER — Other Ambulatory Visit: Payer: Self-pay | Admitting: Family Medicine

## 2016-09-26 NOTE — Telephone Encounter (Signed)
Routing to provider. FYI.  

## 2016-09-26 NOTE — Telephone Encounter (Signed)
Patient called in regards to her prescription for levothyroxine that she picked up yesterday needing a pre authorization.  Please Advise.  Thank you

## 2016-09-26 NOTE — Telephone Encounter (Signed)
Needs to come in for lab

## 2016-09-26 NOTE — Telephone Encounter (Signed)
Patient needs to come in for labs before hand.

## 2016-09-26 NOTE — Telephone Encounter (Signed)
Called patient and informed her that she would have to come in for labs. Patient stated that she will come in for labs tomorrow (09/27/2016) morning

## 2016-09-27 ENCOUNTER — Other Ambulatory Visit: Payer: Medicare Other

## 2016-09-27 DIAGNOSIS — E039 Hypothyroidism, unspecified: Secondary | ICD-10-CM

## 2016-09-28 LAB — TSH: TSH: 2.55 u[IU]/mL (ref 0.450–4.500)

## 2016-09-30 DIAGNOSIS — I5021 Acute systolic (congestive) heart failure: Secondary | ICD-10-CM | POA: Diagnosis not present

## 2016-09-30 LAB — UA/M W/RFLX CULTURE, ROUTINE
BILIRUBIN UA: NEGATIVE
Glucose, UA: NEGATIVE
Nitrite, UA: NEGATIVE
PH UA: 5.5 (ref 5.0–7.5)
RBC UA: NEGATIVE
Specific Gravity, UA: 1.02 (ref 1.005–1.030)
UUROB: 0.2 mg/dL (ref 0.2–1.0)

## 2016-09-30 LAB — MICROSCOPIC EXAMINATION: BACTERIA UA: NONE SEEN

## 2016-09-30 LAB — URINE CULTURE, REFLEX

## 2016-10-01 ENCOUNTER — Telehealth: Payer: Self-pay | Admitting: Family Medicine

## 2016-10-01 MED ORDER — AMOXICILLIN-POT CLAVULANATE 875-125 MG PO TABS: 1.0000 | ORAL_TABLET | Freq: Two times a day (BID) | ORAL | 0 refills | Status: DC

## 2016-10-01 NOTE — Telephone Encounter (Signed)
Please let Amanie know that her thyroid was normal so her meds are at the pharmacy, but that her urine shows a UTI- so I sent her through an antibiotic. Thanks!

## 2016-10-01 NOTE — Telephone Encounter (Signed)
Patient notified

## 2016-10-01 NOTE — Telephone Encounter (Signed)
Patient notified her Rx was sent to her pharmacy

## 2016-10-01 NOTE — Telephone Encounter (Signed)
-----   Message from Volney American, [place] sent at 10/01/2016  8:18 AM EDT ----- This was that pt that had come in for UDS and added a U/A  ----- Message ----- From: Interface, [facility] Lab Results In Sent: 09/25/2016   4:40 PM To: Volney American, PA-C

## 2016-10-02 LAB — DRUG SCREEN 764883 11+OXYCO+ALC+CRT-BUND
AMPHETAMINES, URINE: NEGATIVE ng/mL
BENZODIAZ UR QL: NEGATIVE ng/mL
Barbiturate: NEGATIVE ng/mL
Cannabinoid Quant, Ur: NEGATIVE ng/mL
Cocaine (Metabolite): NEGATIVE ng/mL
Creatinine: 90.1 mg/dL (ref 20.0–300.0)
ETHANOL: NEGATIVE %
MEPERIDINE: NEGATIVE ng/mL
METHADONE SCREEN, URINE: NEGATIVE ng/mL
Oxycodone/Oxymorphone, Urine: NEGATIVE ng/mL
PROPOXYPHENE: NEGATIVE ng/mL
Phencyclidine: NEGATIVE ng/mL
TRAMADOL: NEGATIVE ng/mL
pH, Urine: 5.6 (ref 4.5–8.9)

## 2016-10-02 LAB — OPIATES CONFIRMATION, URINE
Codeine: NEGATIVE
HYDROMORPHONE CONFIRM: 1730 ng/mL
Hydrocodone: POSITIVE — AB
Hydromorphone: POSITIVE — AB
Morphine: NEGATIVE
Opiates: POSITIVE ng/mL — AB

## 2016-10-08 ENCOUNTER — Other Ambulatory Visit: Payer: Self-pay | Admitting: Family Medicine

## 2016-10-08 MED ORDER — HYDROCODONE-ACETAMINOPHEN 10-325 MG PO TABS: 1.0000 | ORAL_TABLET | Freq: Three times a day (TID) | ORAL | 0 refills | Status: DC | PRN

## 2016-10-23 ENCOUNTER — Telehealth: Payer: Self-pay | Admitting: Family Medicine

## 2016-10-23 NOTE — Telephone Encounter (Signed)
Patient is due for a colorectal screening and would like to get the kit sent to her home. Patient said she had the kit before but did not use it. Patient prefers to use kit.  Please Advise.  Thank you  

## 2016-10-23 NOTE — Telephone Encounter (Signed)
Cologuard from completed and placed on provider's desk for signature.

## 2016-10-30 ENCOUNTER — Other Ambulatory Visit: Payer: Self-pay | Admitting: Family Medicine

## 2016-10-30 MED ORDER — HYDROCODONE-ACETAMINOPHEN 10-325 MG PO TABS: 1.0000 | ORAL_TABLET | Freq: Three times a day (TID) | ORAL | 0 refills | Status: DC | PRN

## 2016-10-31 ENCOUNTER — Encounter: Payer: Self-pay | Admitting: Family Medicine

## 2016-10-31 ENCOUNTER — Ambulatory Visit (INDEPENDENT_AMBULATORY_CARE_PROVIDER_SITE_OTHER): Payer: Medicare Other | Admitting: Family Medicine

## 2016-10-31 ENCOUNTER — Ambulatory Visit
Admission: RE | Admit: 2016-10-31 | Discharge: 2016-10-31 | Disposition: A | Discharge status: Home / Self Care | Payer: Medicare Other | Source: Ambulatory Visit | Attending: Family Medicine | Admitting: Family Medicine

## 2016-10-31 ENCOUNTER — Telehealth: Payer: Self-pay | Admitting: Family Medicine

## 2016-10-31 VITALS — BP 155/68 | HR 95 | Temp 98.0°F | Wt 135.0 lb

## 2016-10-31 DIAGNOSIS — R0602 Shortness of breath: Secondary | ICD-10-CM

## 2016-10-31 DIAGNOSIS — J441 Chronic obstructive pulmonary disease with (acute) exacerbation: Secondary | ICD-10-CM | POA: Diagnosis not present

## 2016-10-31 DIAGNOSIS — R05 Cough: Secondary | ICD-10-CM | POA: Diagnosis not present

## 2016-10-31 DIAGNOSIS — I5021 Acute systolic (congestive) heart failure: Secondary | ICD-10-CM | POA: Diagnosis not present

## 2016-10-31 MED ORDER — ALBUTEROL SULFATE HFA 108 (90 BASE) MCG/ACT IN AERS: 2.0000 | INHALATION_SPRAY | Freq: Four times a day (QID) | RESPIRATORY_TRACT | 0 refills | Status: DC | PRN

## 2016-10-31 MED ORDER — AZITHROMYCIN 250 MG PO TABS: ORAL_TABLET | ORAL | 0 refills | Status: DC

## 2016-10-31 NOTE — Telephone Encounter (Signed)
Please call and let her know her chest x-ray came back normal - no pneumonia or fluid on her lungs. I am going to send over some antibiotics and an inhaler for her to start on. She should come back if not feeling much better over the next few days.

## 2016-10-31 NOTE — Progress Notes (Signed)
BP (!) 155/68   Pulse 95   Temp 98 F (36.7 C)   Wt 135 lb (61.2 kg)   SpO2 93%   BMI 22.47 kg/m    Subjective:    Patient ID: [name], female    DOB: [birth year], 66 y.o.   MRN: [id number]  HPI: [name] is a 66 y.o. female  Chief Complaint  Patient presents with  . URI    x 4 days, chest congestion, non prod. cough, rattling, headache. Says her lungs feel like they are full of hot water.  No fever, no runny nose, no sore throat, no ear ache.   Patient with 40+ year smoking hx, CHF, and hx of pneumonia x 8-10 times presents with 4 days of congestion, hacking cough, rattling in chest, HA, SOB. States it feels like her lungs are full of hot water. Denies fever, chills, sore throat, ear pain. Has been trying some mucinex with no relief. States cold medications make her very nervous and jumpy. Is to be taking 20-40 mg lasix daily prn for her LE edema but has dizziness and malaise with it so hasn't taken any since Tuesday. Does note some increased leg edema. No sick contacts.   Relevant past medical, surgical, family and social history reviewed and updated as indicated. Interim medical history since our last visit reviewed. Allergies and medications reviewed and updated.  Review of Systems  Constitutional: Positive for fatigue.  HENT: Positive for congestion.   Eyes: Negative.   Respiratory: Positive for cough and shortness of breath.   Cardiovascular: Positive for leg swelling.  Gastrointestinal: Negative.   Genitourinary: Negative.   Musculoskeletal: Negative.   Neurological: Positive for headaches.  Psychiatric/Behavioral: Negative.    Per HPI unless specifically indicated above     Objective:    BP (!) 155/68   Pulse 95   Temp 98 F (36.7 C)   Wt 135 lb (61.2 kg)   SpO2 93%   BMI 22.47 kg/m   Wt Readings from Last 3 Encounters:  10/31/16 135 lb (61.2 kg)  08/21/16 134 lb 4.8 oz (60.9 kg)  07/09/16 137 lb 8 oz (62.4 kg)    Physical Exam    Constitutional: She is oriented to person, place, and time. She appears well-developed and well-nourished. No distress.  HENT:  Head: Atraumatic.  Right Ear: External ear normal.  Left Ear: External ear normal.  Nose: Nose normal.  Mouth/Throat: Oropharynx is clear and moist.  Eyes: Conjunctivae are normal. Pupils are equal, round, and reactive to light.  Neck: Normal range of motion. Neck supple.  Cardiovascular: Normal rate, normal heart sounds and intact distal pulses.   Pulmonary/Chest: Effort normal. No respiratory distress. She has wheezes.  Musculoskeletal: Normal range of motion. She exhibits edema (Mild symmetric LE edema b/l). She exhibits no tenderness.  - homan's sign b/l   Neurological: She is alert and oriented to person, place, and time.  Skin: Skin is warm and dry. No erythema.  Psychiatric: She has a normal mood and affect. Her behavior is normal.  Nursing note and vitals reviewed.   Results for orders placed or performed in visit on 09/27/16  TSH  Result Value Ref Range   TSH 2.550 0.450 - 4.500 uIU/mL      Assessment & Plan:   Problem List Items Addressed This Visit    None    Visit Diagnoses    Chronic obstructive pulmonary disease with acute exacerbation ([place])    -  Primary  Afebrile, CXR WNL. Will treat with azithromycin, albuterol, and plain mucinex. F/u if worsening or no improvement.    Relevant Orders   DG Chest 2 View (Completed)       Follow up plan: Return if symptoms worsen or fail to improve.

## 2016-10-31 NOTE — Telephone Encounter (Signed)
Patient notified

## 2016-11-20 ENCOUNTER — Inpatient Hospital Stay: Payer: Medicare Other | Attending: Oncology | Admitting: Oncology

## 2016-11-20 ENCOUNTER — Inpatient Hospital Stay: Payer: Medicare Other

## 2016-11-20 ENCOUNTER — Ambulatory Visit: Payer: Medicare Other

## 2016-11-20 ENCOUNTER — Encounter: Payer: Medicare Other | Admitting: Family Medicine

## 2016-11-20 VITALS — BP 109/68 | HR 94 | Temp 96.7°F | Resp 18 | Wt 137.6 lb

## 2016-11-20 DIAGNOSIS — M545 Low back pain: Secondary | ICD-10-CM | POA: Insufficient documentation

## 2016-11-20 DIAGNOSIS — E785 Hyperlipidemia, unspecified: Secondary | ICD-10-CM

## 2016-11-20 DIAGNOSIS — F17210 Nicotine dependence, cigarettes, uncomplicated: Secondary | ICD-10-CM

## 2016-11-20 DIAGNOSIS — G8929 Other chronic pain: Secondary | ICD-10-CM | POA: Insufficient documentation

## 2016-11-20 DIAGNOSIS — M129 Arthropathy, unspecified: Secondary | ICD-10-CM | POA: Diagnosis not present

## 2016-11-20 DIAGNOSIS — I739 Peripheral vascular disease, unspecified: Secondary | ICD-10-CM | POA: Diagnosis not present

## 2016-11-20 DIAGNOSIS — I5022 Chronic systolic (congestive) heart failure: Secondary | ICD-10-CM | POA: Insufficient documentation

## 2016-11-20 DIAGNOSIS — Z801 Family history of malignant neoplasm of trachea, bronchus and lung: Secondary | ICD-10-CM | POA: Insufficient documentation

## 2016-11-20 DIAGNOSIS — I429 Cardiomyopathy, unspecified: Secondary | ICD-10-CM | POA: Diagnosis not present

## 2016-11-20 DIAGNOSIS — Z803 Family history of malignant neoplasm of breast: Secondary | ICD-10-CM

## 2016-11-20 DIAGNOSIS — R0602 Shortness of breath: Secondary | ICD-10-CM

## 2016-11-20 DIAGNOSIS — D500 Iron deficiency anemia secondary to blood loss (chronic): Secondary | ICD-10-CM

## 2016-11-20 DIAGNOSIS — I998 Other disorder of circulatory system: Secondary | ICD-10-CM | POA: Insufficient documentation

## 2016-11-20 DIAGNOSIS — E039 Hypothyroidism, unspecified: Secondary | ICD-10-CM | POA: Diagnosis not present

## 2016-11-20 DIAGNOSIS — I252 Old myocardial infarction: Secondary | ICD-10-CM

## 2016-11-20 DIAGNOSIS — K219 Gastro-esophageal reflux disease without esophagitis: Secondary | ICD-10-CM

## 2016-11-20 DIAGNOSIS — F329 Major depressive disorder, single episode, unspecified: Secondary | ICD-10-CM

## 2016-11-20 DIAGNOSIS — M2550 Pain in unspecified joint: Secondary | ICD-10-CM | POA: Diagnosis not present

## 2016-11-20 DIAGNOSIS — D473 Essential (hemorrhagic) thrombocythemia: Secondary | ICD-10-CM | POA: Insufficient documentation

## 2016-11-20 DIAGNOSIS — R531 Weakness: Secondary | ICD-10-CM | POA: Insufficient documentation

## 2016-11-20 DIAGNOSIS — R5383 Other fatigue: Secondary | ICD-10-CM | POA: Diagnosis not present

## 2016-11-20 DIAGNOSIS — D509 Iron deficiency anemia, unspecified: Secondary | ICD-10-CM | POA: Diagnosis not present

## 2016-11-20 DIAGNOSIS — I10 Essential (primary) hypertension: Secondary | ICD-10-CM | POA: Diagnosis not present

## 2016-11-20 DIAGNOSIS — Z9981 Dependence on supplemental oxygen: Secondary | ICD-10-CM | POA: Insufficient documentation

## 2016-11-20 DIAGNOSIS — Z7982 Long term (current) use of aspirin: Secondary | ICD-10-CM | POA: Diagnosis not present

## 2016-11-20 DIAGNOSIS — M4800 Spinal stenosis, site unspecified: Secondary | ICD-10-CM | POA: Insufficient documentation

## 2016-11-20 DIAGNOSIS — Z8711 Personal history of peptic ulcer disease: Secondary | ICD-10-CM

## 2016-11-20 DIAGNOSIS — Z800 Family history of malignant neoplasm of digestive organs: Secondary | ICD-10-CM

## 2016-11-20 DIAGNOSIS — D649 Anemia, unspecified: Secondary | ICD-10-CM

## 2016-11-20 LAB — CBC WITH DIFFERENTIAL/PLATELET
BASOS PCT: 1 %
Basophils Absolute: 0.1 10*3/uL (ref 0–0.1)
EOS ABS: 0.9 10*3/uL — AB (ref 0–0.7)
EOS PCT: 9 %
HCT: 27.9 % — ABNORMAL LOW (ref 35.0–47.0)
HEMOGLOBIN: 8.7 g/dL — AB (ref 12.0–16.0)
Lymphocytes Relative: 20 %
Lymphs Abs: 1.9 10*3/uL (ref 1.0–3.6)
MCH: 23.6 pg — ABNORMAL LOW (ref 26.0–34.0)
MCHC: 31.2 g/dL — AB (ref 32.0–36.0)
MCV: 75.5 fL — ABNORMAL LOW (ref 80.0–100.0)
MONOS PCT: 6 %
Monocytes Absolute: 0.5 10*3/uL (ref 0.2–0.9)
NEUTROS PCT: 64 %
Neutro Abs: 6.3 10*3/uL (ref 1.4–6.5)
PLATELETS: 520 10*3/uL — AB (ref 150–440)
RBC: 3.69 MIL/uL — ABNORMAL LOW (ref 3.80–5.20)
RDW: 18.6 % — ABNORMAL HIGH (ref 11.5–14.5)
WBC: 9.6 10*3/uL (ref 3.6–11.0)

## 2016-11-20 LAB — FERRITIN: FERRITIN: 7 ng/mL — AB (ref 11–307)

## 2016-11-20 LAB — IRON AND TIBC
IRON: 19 ug/dL — AB (ref 28–170)
Saturation Ratios: 6 % — ABNORMAL LOW (ref 10.4–31.8)
TIBC: 330 ug/dL (ref 250–450)
UIBC: 311 ug/dL
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
## 2016-11-20 NOTE — Progress Notes (Signed)
Patient reports fatigue, weakness in legs.

## 2016-11-22 ENCOUNTER — Inpatient Hospital Stay: Payer: Medicare Other

## 2016-11-22 VITALS — BP 122/65 | HR 87 | Resp 18

## 2016-11-22 DIAGNOSIS — D500 Iron deficiency anemia secondary to blood loss (chronic): Secondary | ICD-10-CM

## 2016-11-22 DIAGNOSIS — D509 Iron deficiency anemia, unspecified: Secondary | ICD-10-CM | POA: Diagnosis not present

## 2016-11-22 MED ORDER — SODIUM CHLORIDE 0.9 % IV SOLN
510.0000 mg | Freq: Once | INTRAVENOUS | Status: AC
  Administered 2016-11-22: 510 mg via INTRAVENOUS
  Filled 2016-11-22: qty 17

## 2016-11-22 MED ORDER — SODIUM CHLORIDE 0.9 % IV SOLN
Freq: Once | INTRAVENOUS | Status: AC
Start: 2016-11-22 — End: 2016-11-22
  Administered 2016-11-22: 14:00:00 via INTRAVENOUS
  Filled 2016-11-22: qty 1000

## 2016-11-29 ENCOUNTER — Inpatient Hospital Stay: Payer: Medicare Other

## 2016-11-29 VITALS — BP 114/76 | HR 82 | Resp 20

## 2016-11-29 DIAGNOSIS — D509 Iron deficiency anemia, unspecified: Secondary | ICD-10-CM | POA: Diagnosis not present

## 2016-11-29 DIAGNOSIS — D500 Iron deficiency anemia secondary to blood loss (chronic): Secondary | ICD-10-CM

## 2016-11-29 MED ORDER — SODIUM CHLORIDE 0.9 % IV SOLN
Freq: Once | INTRAVENOUS | Status: AC
  Administered 2016-11-29: 14:00:00 via INTRAVENOUS
  Filled 2016-11-29: qty 1000

## 2016-11-29 MED ORDER — FERUMOXYTOL INJECTION 510 MG/17 ML
510.0000 mg | Freq: Once | INTRAVENOUS | Status: AC
  Administered 2016-11-29: 510 mg via INTRAVENOUS
  Filled 2016-11-29: qty 17

## 2016-11-30 DIAGNOSIS — I5021 Acute systolic (congestive) heart failure: Secondary | ICD-10-CM | POA: Diagnosis not present

## 2016-12-03 ENCOUNTER — Ambulatory Visit (INDEPENDENT_AMBULATORY_CARE_PROVIDER_SITE_OTHER): Payer: Medicare Other | Admitting: Family Medicine

## 2016-12-03 ENCOUNTER — Encounter: Payer: Self-pay | Admitting: Family Medicine

## 2016-12-03 VITALS — BP 103/67 | HR 100 | Temp 98.2°F | Ht 63.7 in | Wt 136.3 lb

## 2016-12-03 DIAGNOSIS — E782 Mixed hyperlipidemia: Secondary | ICD-10-CM

## 2016-12-03 DIAGNOSIS — I6523 Occlusion and stenosis of bilateral carotid arteries: Secondary | ICD-10-CM | POA: Diagnosis not present

## 2016-12-03 DIAGNOSIS — M4800 Spinal stenosis, site unspecified: Secondary | ICD-10-CM

## 2016-12-03 DIAGNOSIS — H538 Other visual disturbances: Secondary | ICD-10-CM

## 2016-12-03 DIAGNOSIS — I739 Peripheral vascular disease, unspecified: Secondary | ICD-10-CM

## 2016-12-03 DIAGNOSIS — M1611 Unilateral primary osteoarthritis, right hip: Secondary | ICD-10-CM

## 2016-12-03 DIAGNOSIS — D500 Iron deficiency anemia secondary to blood loss (chronic): Secondary | ICD-10-CM

## 2016-12-03 DIAGNOSIS — E039 Hypothyroidism, unspecified: Secondary | ICD-10-CM | POA: Diagnosis not present

## 2016-12-03 DIAGNOSIS — N179 Acute kidney failure, unspecified: Secondary | ICD-10-CM

## 2016-12-03 DIAGNOSIS — Z0000 Encounter for general adult medical examination without abnormal findings: Secondary | ICD-10-CM

## 2016-12-03 DIAGNOSIS — I2510 Atherosclerotic heart disease of native coronary artery without angina pectoris: Secondary | ICD-10-CM | POA: Diagnosis not present

## 2016-12-03 DIAGNOSIS — Z720 Tobacco use: Secondary | ICD-10-CM | POA: Diagnosis not present

## 2016-12-03 DIAGNOSIS — I10 Essential (primary) hypertension: Secondary | ICD-10-CM

## 2016-12-03 DIAGNOSIS — E559 Vitamin D deficiency, unspecified: Secondary | ICD-10-CM

## 2016-12-03 DIAGNOSIS — I5033 Acute on chronic diastolic (congestive) heart failure: Secondary | ICD-10-CM | POA: Diagnosis not present

## 2016-12-03 DIAGNOSIS — M5442 Lumbago with sciatica, left side: Secondary | ICD-10-CM

## 2016-12-03 DIAGNOSIS — F419 Anxiety disorder, unspecified: Secondary | ICD-10-CM | POA: Diagnosis not present

## 2016-12-03 DIAGNOSIS — G2581 Restless legs syndrome: Secondary | ICD-10-CM

## 2016-12-03 DIAGNOSIS — Z1239 Encounter for other screening for malignant neoplasm of breast: Secondary | ICD-10-CM

## 2016-12-03 DIAGNOSIS — Z7189 Other specified counseling: Secondary | ICD-10-CM | POA: Diagnosis not present

## 2016-12-03 DIAGNOSIS — M7989 Other specified soft tissue disorders: Secondary | ICD-10-CM

## 2016-12-03 DIAGNOSIS — H5213 Myopia, bilateral: Secondary | ICD-10-CM

## 2016-12-03 DIAGNOSIS — G8929 Other chronic pain: Secondary | ICD-10-CM

## 2016-12-03 DIAGNOSIS — Z13820 Encounter for screening for osteoporosis: Secondary | ICD-10-CM

## 2016-12-03 NOTE — Assessment & Plan Note (Signed)
A voluntary discussion about advance care planning including the explanation and discussion of advance directives was extensively discussed  with the patient.  Explanation about the health care proxy and Living will was reviewed and packet with forms with explanation of how to fill them out was given.  During this discussion, the patient was not able to identify a health care proxy, but plans to fill out the paperwork required.  Patient was offered a separate [place] visit for further assistance with forms.

## 2016-12-03 NOTE — Assessment & Plan Note (Signed)
Referral back to Dr. Lucky Cowboy made today.

## 2016-12-03 NOTE — Assessment & Plan Note (Signed)
S/P endarterectomy. Follows with Dr. Lucky Cowboy- will get her back in to see him again.

## 2016-12-03 NOTE — Assessment & Plan Note (Signed)
Stable on current medicine. Continue TID pain medicine.

## 2016-12-03 NOTE — Assessment & Plan Note (Signed)
Under good control. Not on medicine. Will continue to monitor. Call with any concerns.

## 2016-12-03 NOTE — Assessment & Plan Note (Signed)
Stable. Continue TID pain medicine.

## 2016-12-03 NOTE — Assessment & Plan Note (Signed)
Not interested in quitting right now. Would qualify for lung cancer screening- note sent to [facility] today.

## 2016-12-03 NOTE — Assessment & Plan Note (Signed)
Acting up due to iron being low. Continue to follow with hematology. Call with any concerns.

## 2016-12-03 NOTE — Assessment & Plan Note (Signed)
Rechecking levels today. Await results.  

## 2016-12-03 NOTE — Assessment & Plan Note (Signed)
Stable. Continue to follow with Dr. Rockey Situ. Call with any concerns.

## 2016-12-03 NOTE — Assessment & Plan Note (Signed)
Stable on current regimen. Continue TID pain medicine.

## 2016-12-03 NOTE — Patient Instructions (Addendum)
Preventative Services:  Health Risk Assessment and Personalized Prevention Plan: Done today Bone Mass Measurements: Ordered today Breast Cancer Screening: Ordered today CVD Screening: Ordered today Cervical Cancer Screening: N/A Colon Cancer Screening: Up to date Depression Screening: Done today Diabetes Screening: Done today Glaucoma Screening: See your eye doctor Hepatitis B vaccine: N/A Hepatitis C screening: Up to Date HIV Screening: Up to date Flu Vaccine: Get in October Lung cancer Screening: Ordered today Obesity Screening: Done today  Pneumonia Vaccines (2): Declined STI Screening: N/A  Health Maintenance, Female Adopting a healthy lifestyle and getting preventive care can go a long way to promote health and wellness. Talk with your health care provider about what schedule of regular examinations is right for you. This is a good chance for you to check in with your provider about disease prevention and staying healthy. In between checkups, there are plenty of things you can do on your own. Experts have done a lot of research about which lifestyle changes and preventive measures are most likely to keep you healthy. Ask your health care provider for more information. Weight and diet Eat a healthy diet  Be sure to include plenty of vegetables, fruits, low-fat dairy products, and lean protein.  Do not eat a lot of foods high in solid fats, added sugars, or salt.  Get regular exercise. This is one of the most important things you can do for your health. ? Most adults should exercise for at least 150 minutes each week. The exercise should increase your heart rate and make you sweat (moderate-intensity exercise). ? Most adults should also do strengthening exercises at least twice a week. This is in addition to the moderate-intensity exercise.  Maintain a healthy weight  Body mass index (BMI) is a measurement that can be used to identify possible weight problems. It estimates body  fat based on height and weight. Your health care provider can help determine your BMI and help you achieve or maintain a healthy weight.  For females 73 years of age and older: ? A BMI below 18.5 is considered underweight. ? A BMI of 18.5 to 24.9 is normal. ? A BMI of 25 to 29.9 is considered overweight. ? A BMI of 30 and above is considered obese.  Watch levels of cholesterol and blood lipids  You should start having your blood tested for lipids and cholesterol at 67 years of age, then have this test every 5 years.  You may need to have your cholesterol levels checked more often if: ? Your lipid or cholesterol levels are high. ? You are older than 67 years of age. ? You are at high risk for heart disease.  Cancer screening Lung Cancer  Lung cancer screening is recommended for adults 11-62 years old who are at high risk for lung cancer because of a history of smoking.  A yearly low-dose CT scan of the lungs is recommended for people who: ? Currently smoke. ? Have quit within the past 15 years. ? Have at least a 30-pack-year history of smoking. A pack year is smoking an average of one pack of cigarettes a day for 1 year.  Yearly screening should continue until it has been 15 years since you quit.  Yearly screening should stop if you develop a health problem that would prevent you from having lung cancer treatment.  Breast Cancer  Practice breast self-awareness. This means understanding how your breasts normally appear and feel.  It also means doing regular breast self-exams. Let your health care  provider know about any changes, no matter how small.  If you are in your 20s or 30s, you should have a clinical breast exam (CBE) by a health care provider every 1-3 years as part of a regular health exam.  If you are 67 or older, have a CBE every year. Also consider having a breast X-ray (mammogram) every year.  If you have a family history of breast cancer, talk to your health care  provider about genetic screening.  If you are at high risk for breast cancer, talk to your health care provider about having an MRI and a mammogram every year.  Breast cancer gene (BRCA) assessment is recommended for women who have family members with BRCA-related cancers. BRCA-related cancers include: ? Breast. ? Ovarian. ? Tubal. ? Peritoneal cancers.  Results of the assessment will determine the need for genetic counseling and BRCA1 and BRCA2 testing.  Cervical Cancer Your health care provider may recommend that you be screened regularly for cancer of the pelvic organs (ovaries, uterus, and vagina). This screening involves a pelvic examination, including checking for microscopic changes to the surface of your cervix (Pap test). You may be encouraged to have this screening done every 3 years, beginning at age 74.  For women ages 62-65, health care providers may recommend pelvic exams and Pap testing every 3 years, or they may recommend the Pap and pelvic exam, combined with testing for human papilloma virus (HPV), every 5 years. Some types of HPV increase your risk of cervical cancer. Testing for HPV may also be done on women of any age with unclear Pap test results.  Other health care providers may not recommend any screening for nonpregnant women who are considered low risk for pelvic cancer and who do not have symptoms. Ask your health care provider if a screening pelvic exam is right for you.  If you have had past treatment for cervical cancer or a condition that could lead to cancer, you need Pap tests and screening for cancer for at least 20 years after your treatment. If Pap tests have been discontinued, your risk factors (such as having a new sexual partner) need to be reassessed to determine if screening should resume. Some women have medical problems that increase the chance of getting cervical cancer. In these cases, your health care provider may recommend more frequent screening and  Pap tests.  Colorectal Cancer  This type of cancer can be detected and often prevented.  Routine colorectal cancer screening usually begins at 67 years of age and continues through 67 years of age.  Your health care provider may recommend screening at an earlier age if you have risk factors for colon cancer.  Your health care provider may also recommend using home test kits to check for hidden blood in the stool.  A small camera at the end of a tube can be used to examine your colon directly (sigmoidoscopy or colonoscopy). This is done to check for the earliest forms of colorectal cancer.  Routine screening usually begins at age 57.  Direct examination of the colon should be repeated every 5-10 years through 67 years of age. However, you may need to be screened more often if early forms of precancerous polyps or small growths are found.  Skin Cancer  Check your skin from head to toe regularly.  Tell your health care provider about any new moles or changes in moles, especially if there is a change in a mole's shape or color.  Also tell your  health care provider if you have a mole that is larger than the size of a pencil eraser.  Always use sunscreen. Apply sunscreen liberally and repeatedly throughout the day.  Protect yourself by wearing long sleeves, pants, a wide-brimmed hat, and sunglasses whenever you are outside.  Heart disease, diabetes, and high blood pressure  High blood pressure causes heart disease and increases the risk of stroke. High blood pressure is more likely to develop in: ? People who have blood pressure in the high end of the normal range (130-139/85-89 mm Hg). ? People who are overweight or obese. ? People who are African American.  If you are 10-32 years of age, have your blood pressure checked every 3-5 years. If you are 66 years of age or older, have your blood pressure checked every year. You should have your blood pressure measured twice-once when you  are at a hospital or clinic, and once when you are not at a hospital or clinic. Record the average of the two measurements. To check your blood pressure when you are not at a hospital or clinic, you can use: ? An automated blood pressure machine at a pharmacy. ? A home blood pressure monitor.  If you are between 6 years and 56 years old, ask your health care provider if you should take aspirin to prevent strokes.  Have regular diabetes screenings. This involves taking a blood sample to check your fasting blood sugar level. ? If you are at a normal weight and have a low risk for diabetes, have this test once every three years after 67 years of age. ? If you are overweight and have a high risk for diabetes, consider being tested at a younger age or more often. Preventing infection Hepatitis B  If you have a higher risk for hepatitis B, you should be screened for this virus. You are considered at high risk for hepatitis B if: ? You were born in a country where hepatitis B is common. Ask your health care provider which countries are considered high risk. ? Your parents were born in a high-risk country, and you have not been immunized against hepatitis B (hepatitis B vaccine). ? You have HIV or AIDS. ? You use needles to inject street drugs. ? You live with someone who has hepatitis B. ? You have had sex with someone who has hepatitis B. ? You get hemodialysis treatment. ? You take certain medicines for conditions, including cancer, organ transplantation, and autoimmune conditions.  Hepatitis C  Blood testing is recommended for: ? Everyone born from [birth year] through 1966. ? Anyone with known risk factors for hepatitis C.  Sexually transmitted infections (STIs)  You should be screened for sexually transmitted infections (STIs) including gonorrhea and chlamydia if: ? You are sexually active and are younger than 67 years of age. ? You are older than 67 years of age and your health care provider  tells you that you are at risk for this type of infection. ? Your sexual activity has changed since you were last screened and you are at an increased risk for chlamydia or gonorrhea. Ask your health care provider if you are at risk.  If you do not have HIV, but are at risk, it may be recommended that you take a prescription medicine daily to prevent HIV infection. This is called pre-exposure prophylaxis (PrEP). You are considered at risk if: ? You are sexually active and do not regularly use condoms or know the HIV status of your partner(s). ?  You take drugs by injection. ? You are sexually active with a partner who has HIV.  Talk with your health care provider about whether you are at high risk of being infected with HIV. If you choose to begin PrEP, you should first be tested for HIV. You should then be tested every 3 months for as long as you are taking PrEP. Pregnancy  If you are premenopausal and you may become pregnant, ask your health care provider about preconception counseling.  If you may become pregnant, take 400 to 800 micrograms (mcg) of folic acid every day.  If you want to prevent pregnancy, talk to your health care provider about birth control (contraception). Osteoporosis and menopause  Osteoporosis is a disease in which the bones lose minerals and strength with aging. This can result in serious bone fractures. Your risk for osteoporosis can be identified using a bone density scan.  If you are 35 years of age or older, or if you are at risk for osteoporosis and fractures, ask your health care provider if you should be screened.  Ask your health care provider whether you should take a calcium or vitamin D supplement to lower your risk for osteoporosis.  Menopause may have certain physical symptoms and risks.  Hormone replacement therapy may reduce some of these symptoms and risks. Talk to your health care provider about whether hormone replacement therapy is right for  you. Follow these instructions at home:  Schedule regular health, dental, and eye exams.  Stay current with your immunizations.  Do not use any tobacco products including cigarettes, chewing tobacco, or electronic cigarettes.  If you are pregnant, do not drink alcohol.  If you are breastfeeding, limit how much and how often you drink alcohol.  Limit alcohol intake to no more than 1 drink per day for nonpregnant women. One drink equals 12 ounces of beer, 5 ounces of wine, or 1 ounces of hard liquor.  Do not use street drugs.  Do not share needles.  Ask your health care provider for help if you need support or information about quitting drugs.  Tell your health care provider if you often feel depressed.  Tell your health care provider if you have ever been abused or do not feel safe at home. This information is not intended to replace advice given to you by your health care provider. Make sure you discuss any questions you have with your health care provider. Document Released: 11/05/2010 Document Revised: 09/28/2015 Document Reviewed: 01/24/2015 Elsevier Interactive Patient Education  2018 [employer] Maintenance for Postmenopausal Women Menopause is a normal process in which your reproductive ability comes to an end. This process happens gradually over a span of months to years, usually between the ages of 52 and 50. Menopause is complete when you have missed 12 consecutive menstrual periods. It is important to talk with your health care provider about some of the most common conditions that affect postmenopausal women, such as heart disease, cancer, and bone loss (osteoporosis). Adopting a healthy lifestyle and getting preventive care can help to promote your health and wellness. Those actions can also lower your chances of developing some of these common conditions. What should I know about menopause? During menopause, you may experience a number of symptoms, such  as:  Moderate-to-severe hot flashes.  Night sweats.  Decrease in sex drive.  Mood swings.  Headaches.  Tiredness.  Irritability.  Memory problems.  Insomnia.  Choosing to treat or not to treat menopausal changes is  an individual decision that you make with your health care provider. What should I know about hormone replacement therapy and supplements? Hormone therapy products are effective for treating symptoms that are associated with menopause, such as hot flashes and night sweats. Hormone replacement carries certain risks, especially as you become older. If you are thinking about using estrogen or estrogen with progestin treatments, discuss the benefits and risks with your health care provider. What should I know about heart disease and stroke? Heart disease, heart attack, and stroke become more likely as you age. This may be due, in part, to the hormonal changes that your body experiences during menopause. These can affect how your body processes dietary fats, triglycerides, and cholesterol. Heart attack and stroke are both medical emergencies. There are many things that you can do to help prevent heart disease and stroke:  Have your blood pressure checked at least every 1-2 years. High blood pressure causes heart disease and increases the risk of stroke.  If you are 41-10 years old, ask your health care provider if you should take aspirin to prevent a heart attack or a stroke.  Do not use any tobacco products, including cigarettes, chewing tobacco, or electronic cigarettes. If you need help quitting, ask your health care provider.  It is important to eat a healthy diet and maintain a healthy weight. ? Be sure to include plenty of vegetables, fruits, low-fat dairy products, and lean protein. ? Avoid eating foods that are high in solid fats, added sugars, or salt (sodium).  Get regular exercise. This is one of the most important things that you can do for your health. ? Try  to exercise for at least 150 minutes each week. The type of exercise that you do should increase your heart rate and make you sweat. This is known as moderate-intensity exercise. ? Try to do strengthening exercises at least twice each week. Do these in addition to the moderate-intensity exercise.  Know your numbers.Ask your health care provider to check your cholesterol and your blood glucose. Continue to have your blood tested as directed by your health care provider.  What should I know about cancer screening? There are several types of cancer. Take the following steps to reduce your risk and to catch any cancer development as early as possible. Breast Cancer  Practice breast self-awareness. ? This means understanding how your breasts normally appear and feel. ? It also means doing regular breast self-exams. Let your health care provider know about any changes, no matter how small.  If you are 72 or older, have a clinician do a breast exam (clinical breast exam or CBE) every year. Depending on your age, family history, and medical history, it may be recommended that you also have a yearly breast X-ray (mammogram).  If you have a family history of breast cancer, talk with your health care provider about genetic screening.  If you are at high risk for breast cancer, talk with your health care provider about having an MRI and a mammogram every year.  Breast cancer (BRCA) gene test is recommended for women who have family members with BRCA-related cancers. Results of the assessment will determine the need for genetic counseling and BRCA1 and for BRCA2 testing. BRCA-related cancers include these types: ? Breast. This occurs in males or females. ? Ovarian. ? Tubal. This may also be called fallopian tube cancer. ? Cancer of the abdominal or pelvic lining (peritoneal cancer). ? Prostate. ? Pancreatic.  Cervical, Uterine, and Ovarian Cancer Your health  care provider may recommend that you be  screened regularly for cancer of the pelvic organs. These include your ovaries, uterus, and vagina. This screening involves a pelvic exam, which includes checking for microscopic changes to the surface of your cervix (Pap test).  For women ages 21-65, health care providers may recommend a pelvic exam and a Pap test every three years. For women ages 82-65, they may recommend the Pap test and pelvic exam, combined with testing for human papilloma virus (HPV), every five years. Some types of HPV increase your risk of cervical cancer. Testing for HPV may also be done on women of any age who have unclear Pap test results.  Other health care providers may not recommend any screening for nonpregnant women who are considered low risk for pelvic cancer and have no symptoms. Ask your health care provider if a screening pelvic exam is right for you.  If you have had past treatment for cervical cancer or a condition that could lead to cancer, you need Pap tests and screening for cancer for at least 20 years after your treatment. If Pap tests have been discontinued for you, your risk factors (such as having a new sexual partner) need to be reassessed to determine if you should start having screenings again. Some women have medical problems that increase the chance of getting cervical cancer. In these cases, your health care provider may recommend that you have screening and Pap tests more often.  If you have a family history of uterine cancer or ovarian cancer, talk with your health care provider about genetic screening.  If you have vaginal bleeding after reaching menopause, tell your health care provider.  There are currently no reliable tests available to screen for ovarian cancer.  Lung Cancer Lung cancer screening is recommended for adults 5-40 years old who are at high risk for lung cancer because of a history of smoking. A yearly low-dose CT scan of the lungs is recommended if you:  Currently  smoke.  Have a history of at least 30 pack-years of smoking and you currently smoke or have quit within the past 15 years. A pack-year is smoking an average of one pack of cigarettes per day for one year.  Yearly screening should:  Continue until it has been 15 years since you quit.  Stop if you develop a health problem that would prevent you from having lung cancer treatment.  Colorectal Cancer  This type of cancer can be detected and can often be prevented.  Routine colorectal cancer screening usually begins at age 7 and continues through age 51.  If you have risk factors for colon cancer, your health care provider may recommend that you be screened at an earlier age.  If you have a family history of colorectal cancer, talk with your health care provider about genetic screening.  Your health care provider may also recommend using home test kits to check for hidden blood in your stool.  A small camera at the end of a tube can be used to examine your colon directly (sigmoidoscopy or colonoscopy). This is done to check for the earliest forms of colorectal cancer.  Direct examination of the colon should be repeated every 5-10 years until age 101. However, if early forms of precancerous polyps or small growths are found or if you have a family history or genetic risk for colorectal cancer, you may need to be screened more often.  Skin Cancer  Check your skin from head to toe regularly.  Monitor any moles. Be sure to tell your health care provider: ? About any new moles or changes in moles, especially if there is a change in a mole's shape or color. ? If you have a mole that is larger than the size of a pencil eraser.  If any of your family members has a history of skin cancer, especially at a young age, talk with your health care provider about genetic screening.  Always use sunscreen. Apply sunscreen liberally and repeatedly throughout the day.  Whenever you are outside, protect  yourself by wearing long sleeves, pants, a wide-brimmed hat, and sunglasses.  What should I know about osteoporosis? Osteoporosis is a condition in which bone destruction happens more quickly than new bone creation. After menopause, you may be at an increased risk for osteoporosis. To help prevent osteoporosis or the bone fractures that can happen because of osteoporosis, the following is recommended:  If you are 33-73 years old, get at least 1,000 mg of calcium and at least 600 mg of vitamin D per day.  If you are older than age 85 but younger than age 21, get at least 1,200 mg of calcium and at least 600 mg of vitamin D per day.  If you are older than age 66, get at least 1,200 mg of calcium and at least 800 mg of vitamin D per day.  Smoking and excessive alcohol intake increase the risk of osteoporosis. Eat foods that are rich in calcium and vitamin D, and do weight-bearing exercises several times each week as directed by your health care provider. What should I know about how menopause affects my mental health? Depression may occur at any age, but it is more common as you become older. Common symptoms of depression include:  Low or sad mood.  Changes in sleep patterns.  Changes in appetite or eating patterns.  Feeling an overall lack of motivation or enjoyment of activities that you previously enjoyed.  Frequent crying spells.  Talk with your health care provider if you think that you are experiencing depression. What should I know about immunizations? It is important that you get and maintain your immunizations. These include:  Tetanus, diphtheria, and pertussis (Tdap) booster vaccine.  Influenza every year before the flu season begins.  Pneumonia vaccine.  Shingles vaccine.  Your health care provider may also recommend other immunizations. This information is not intended to replace advice given to you by your health care provider. Make sure you discuss any questions you  have with your health care provider. Document Released: 06/14/2005 Document Revised: 11/10/2015 Document Reviewed: 01/24/2015 Elsevier Interactive Patient Education  2018 [employer].

## 2016-12-03 NOTE — Assessment & Plan Note (Signed)
Continues with claudication and pain- will get her back into see Dr. Lucky Cowboy. Referral generated today.

## 2016-12-03 NOTE — Assessment & Plan Note (Signed)
Rechecking levels today. Await results. Call with any concerns.  

## 2016-12-03 NOTE — Assessment & Plan Note (Signed)
Continue to follow with cardiology. Will get her back into vascular. Call with any concerns.

## 2016-12-03 NOTE — Assessment & Plan Note (Signed)
Rechecking levels today. Will treat as needed. Call with any concerns.  

## 2016-12-03 NOTE — Assessment & Plan Note (Signed)
Stable. Continue lasix. Continue to follow with Dr. Rockey Situ. Continue to monitor.

## 2016-12-03 NOTE — Assessment & Plan Note (Signed)
Rechecking levels. Await results. Call with any concerns.  

## 2016-12-03 NOTE — Assessment & Plan Note (Signed)
Still acting up from the death of her sister. Continue to monitor. Call with any concerns.
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
## 2016-12-03 NOTE — Assessment & Plan Note (Signed)
Following with hematology for iron infusions. Continue to follow. Call with any concerns.

## 2016-12-04 ENCOUNTER — Other Ambulatory Visit: Payer: Self-pay | Admitting: Family Medicine

## 2016-12-04 ENCOUNTER — Telehealth: Payer: Self-pay | Admitting: *Deleted

## 2016-12-04 DIAGNOSIS — Z87891 Personal history of nicotine dependence: Secondary | ICD-10-CM

## 2016-12-04 LAB — CBC WITH DIFFERENTIAL/PLATELET
BASOS ABS: 0 10*3/uL (ref 0.0–0.2)
Basos: 1 %
EOS (ABSOLUTE): 0.4 10*3/uL (ref 0.0–0.4)
Eos: 6 %
Hematocrit: 37.7 % (ref 34.0–46.6)
Hemoglobin: 10.7 g/dL — ABNORMAL LOW (ref 11.1–15.9)
Immature Grans (Abs): 0.1 10*3/uL (ref 0.0–0.1)
Immature Granulocytes: 1 %
LYMPHS ABS: 1.4 10*3/uL (ref 0.7–3.1)
Lymphs: 18 %
MCH: 24.8 pg — AB (ref 26.6–33.0)
MCHC: 28.4 g/dL — ABNORMAL LOW (ref 31.5–35.7)
MCV: 88 fL (ref 79–97)
MONOS ABS: 0.4 10*3/uL (ref 0.1–0.9)
Monocytes: 6 %
NEUTROS ABS: 5.1 10*3/uL (ref 1.4–7.0)
Neutrophils: 68 %
PLATELETS: 372 10*3/uL (ref 150–379)
RBC: 4.31 x10E6/uL (ref 3.77–5.28)
RDW: 25.1 % — ABNORMAL HIGH (ref 12.3–15.4)
WBC: 7.4 10*3/uL (ref 3.4–10.8)

## 2016-12-04 LAB — COMPREHENSIVE METABOLIC PANEL
ALT: 5 IU/L (ref 0–32)
AST: 16 IU/L (ref 0–40)
Albumin/Globulin Ratio: 1.7 (ref 1.2–2.2)
Albumin: 3.4 g/dL — ABNORMAL LOW (ref 3.6–4.8)
Alkaline Phosphatase: 67 IU/L (ref 39–117)
BUN/Creatinine Ratio: 17 (ref 12–28)
BUN: 14 mg/dL (ref 8–27)
Bilirubin Total: <0.2 mg/dL (ref 0.0–1.2)
CALCIUM: 8.5 mg/dL — AB (ref 8.7–10.3)
CO2: 23 mmol/L (ref 20–29)
CREATININE: 0.81 mg/dL (ref 0.57–1.00)
Chloride: 107 mmol/L — ABNORMAL HIGH (ref 96–106)
GFR calc Af Amer: 88 mL/min/{1.73_m2} (ref >59)
GFR, EST NON AFRICAN AMERICAN: 76 mL/min/{1.73_m2} (ref >59)
GLOBULIN, TOTAL: 2 g/dL (ref 1.5–4.5)
GLUCOSE: 73 mg/dL (ref 65–99)
Potassium: 4.5 mmol/L (ref 3.5–5.2)
SODIUM: 141 mmol/L (ref 134–144)
Total Protein: 5.4 g/dL — ABNORMAL LOW (ref 6.0–8.5)

## 2016-12-04 LAB — IRON AND TIBC
Iron Saturation: 84 % (ref 15–55)
Iron: 219 ug/dL — ABNORMAL HIGH (ref 27–139)
Total Iron Binding Capacity: 262 ug/dL (ref 250–450)
UIBC: 43 ug/dL — AB (ref 118–369)

## 2016-12-04 LAB — LIPID PANEL W/O CHOL/HDL RATIO
CHOLESTEROL TOTAL: 169 mg/dL (ref 100–199)
HDL: 47 mg/dL (ref >39)
LDL CALC: 95 mg/dL (ref 0–99)
TRIGLYCERIDES: 134 mg/dL (ref 0–149)
VLDL CHOLESTEROL CAL: 27 mg/dL (ref 5–40)

## 2016-12-04 LAB — VITAMIN D 25 HYDROXY (VIT D DEFICIENCY, FRACTURES): Vit D, 25-Hydroxy: 18.5 ng/mL — ABNORMAL LOW (ref 30.0–100.0)

## 2016-12-04 LAB — TSH: TSH: 2.09 u[IU]/mL (ref 0.450–4.500)

## 2016-12-04 LAB — FERRITIN: FERRITIN: 373 ng/mL — AB (ref 15–150)

## 2016-12-04 MED ORDER — VITAMIN D (ERGOCALCIFEROL) 1.25 MG (50000 UNIT) PO CAPS: 50000.0000 [IU] | ORAL_CAPSULE | ORAL | 0 refills | Status: DC

## 2016-12-04 NOTE — Telephone Encounter (Signed)
Received referral for initial lung cancer screening scan. Contacted patient and obtained smoking history,(current, 44 pack year) as well as answering questions related to screening process. Patient denies signs of lung cancer such as weight loss or hemoptysis. Patient denies comorbidity that would prevent curative treatment if lung cancer were found. Patient is scheduled for shared decision making visit and CT scan on 12/17/16.

## 2016-12-04 NOTE — Progress Notes (Signed)
ergocal 

## 2016-12-05 ENCOUNTER — Other Ambulatory Visit: Payer: Self-pay | Admitting: Family Medicine

## 2016-12-05 MED ORDER — HYDROCODONE-ACETAMINOPHEN 10-325 MG PO TABS: 1.0000 | ORAL_TABLET | Freq: Three times a day (TID) | ORAL | 0 refills | Status: DC | PRN

## 2016-12-09 LAB — MICROALBUMIN, URINE WAIVED
CREATININE, URINE WAIVED: 100 mg/dL (ref 10–300)
Microalb, Ur Waived: 30 mg/L — ABNORMAL HIGH (ref 0–19)

## 2016-12-09 LAB — UA/M W/RFLX CULTURE, ROUTINE
Bilirubin, UA: NEGATIVE
GLUCOSE, UA: NEGATIVE
KETONES UA: NEGATIVE
Nitrite, UA: NEGATIVE
PROTEIN UA: NEGATIVE
RBC, UA: NEGATIVE
SPEC GRAV UA: 1.02 (ref 1.005–1.030)
Urobilinogen, Ur: 0.2 mg/dL (ref 0.2–1.0)
pH, UA: 5.5 (ref 5.0–7.5)

## 2016-12-09 LAB — MICROSCOPIC EXAMINATION
BACTERIA UA: NONE SEEN
RBC MICROSCOPIC, UA: NONE SEEN /HPF (ref 0–?)

## 2016-12-17 ENCOUNTER — Inpatient Hospital Stay: Payer: Medicare Other | Attending: Oncology | Admitting: Oncology

## 2016-12-17 ENCOUNTER — Telehealth: Payer: Self-pay | Admitting: Family Medicine

## 2016-12-17 ENCOUNTER — Ambulatory Visit: Payer: Medicare Other | Attending: Oncology

## 2016-12-17 DIAGNOSIS — R300 Dysuria: Secondary | ICD-10-CM

## 2016-12-17 NOTE — Progress Notes (Deleted)
In accordance with CMS guidelines, patient has met eligibility criteria including age, absence of signs or symptoms of lung cancer.  Social History  Substance Use Topics  . Smoking status: Current Every Day Smoker    Packs/day: 1.00    Years: 44.00    Types: Cigarettes  . Smokeless tobacco: Never Used  . Alcohol use Yes     Comment: occ     A shared decision-making session was conducted prior to the performance of CT scan. This includes one or more decision aids, includes benefits and harms of screening, follow-up diagnostic testing, over-diagnosis, false positive rate, and total radiation exposure.  Counseling on the importance of adherence to annual lung cancer LDCT screening, impact of co-morbidities, and ability or willingness to undergo diagnosis and treatment is imperative for compliance of the program.  Counseling on the importance of continued smoking cessation for former smokers; the importance of smoking cessation for current smokers, and information about tobacco cessation interventions have been given to patient including [facility] and [facility] quit [facility] programs.  Written order for lung cancer screening with LDCT has been given to the patient and any and all questions have been answered to the best of my abilities.   Yearly follow up will be coordinated by Burgess Estelle, Thoracic Navigator.  Faythe Casa, NP 12/17/2016 8:14 AM

## 2016-12-17 NOTE — Telephone Encounter (Signed)
Patient would like to know if she needs to take her urine test tomorrow when she picks up her medication.   Please Advise.  Thank you

## 2016-12-17 NOTE — Telephone Encounter (Signed)
Routing to provider  

## 2016-12-17 NOTE — Telephone Encounter (Signed)
That would be fine. Order placed.

## 2016-12-18 ENCOUNTER — Telehealth: Payer: Self-pay | Admitting: *Deleted

## 2016-12-18 NOTE — Telephone Encounter (Signed)
Voicemail left to inquire about patient's inability to keep apt yesterday for lung screening scan and to offer rescheduling.

## 2016-12-27 ENCOUNTER — Encounter: Payer: Self-pay | Admitting: *Deleted

## 2016-12-31 DIAGNOSIS — I5021 Acute systolic (congestive) heart failure: Secondary | ICD-10-CM | POA: Diagnosis not present

## 2017-01-06 IMAGING — DX DG CHEST 1V PORT
1 series · 1 of 1 positions shown · non-contrast
Comparison: 03/28/2014

CLINICAL DATA: Pt to ed with c/o hypotension, sent from Physicians
office for increased swelling in bilat feet and ankles x 1 week. ,
slight chest pain, slight sob

EXAM:
PORTABLE CHEST 1 VIEW

[chest ap]
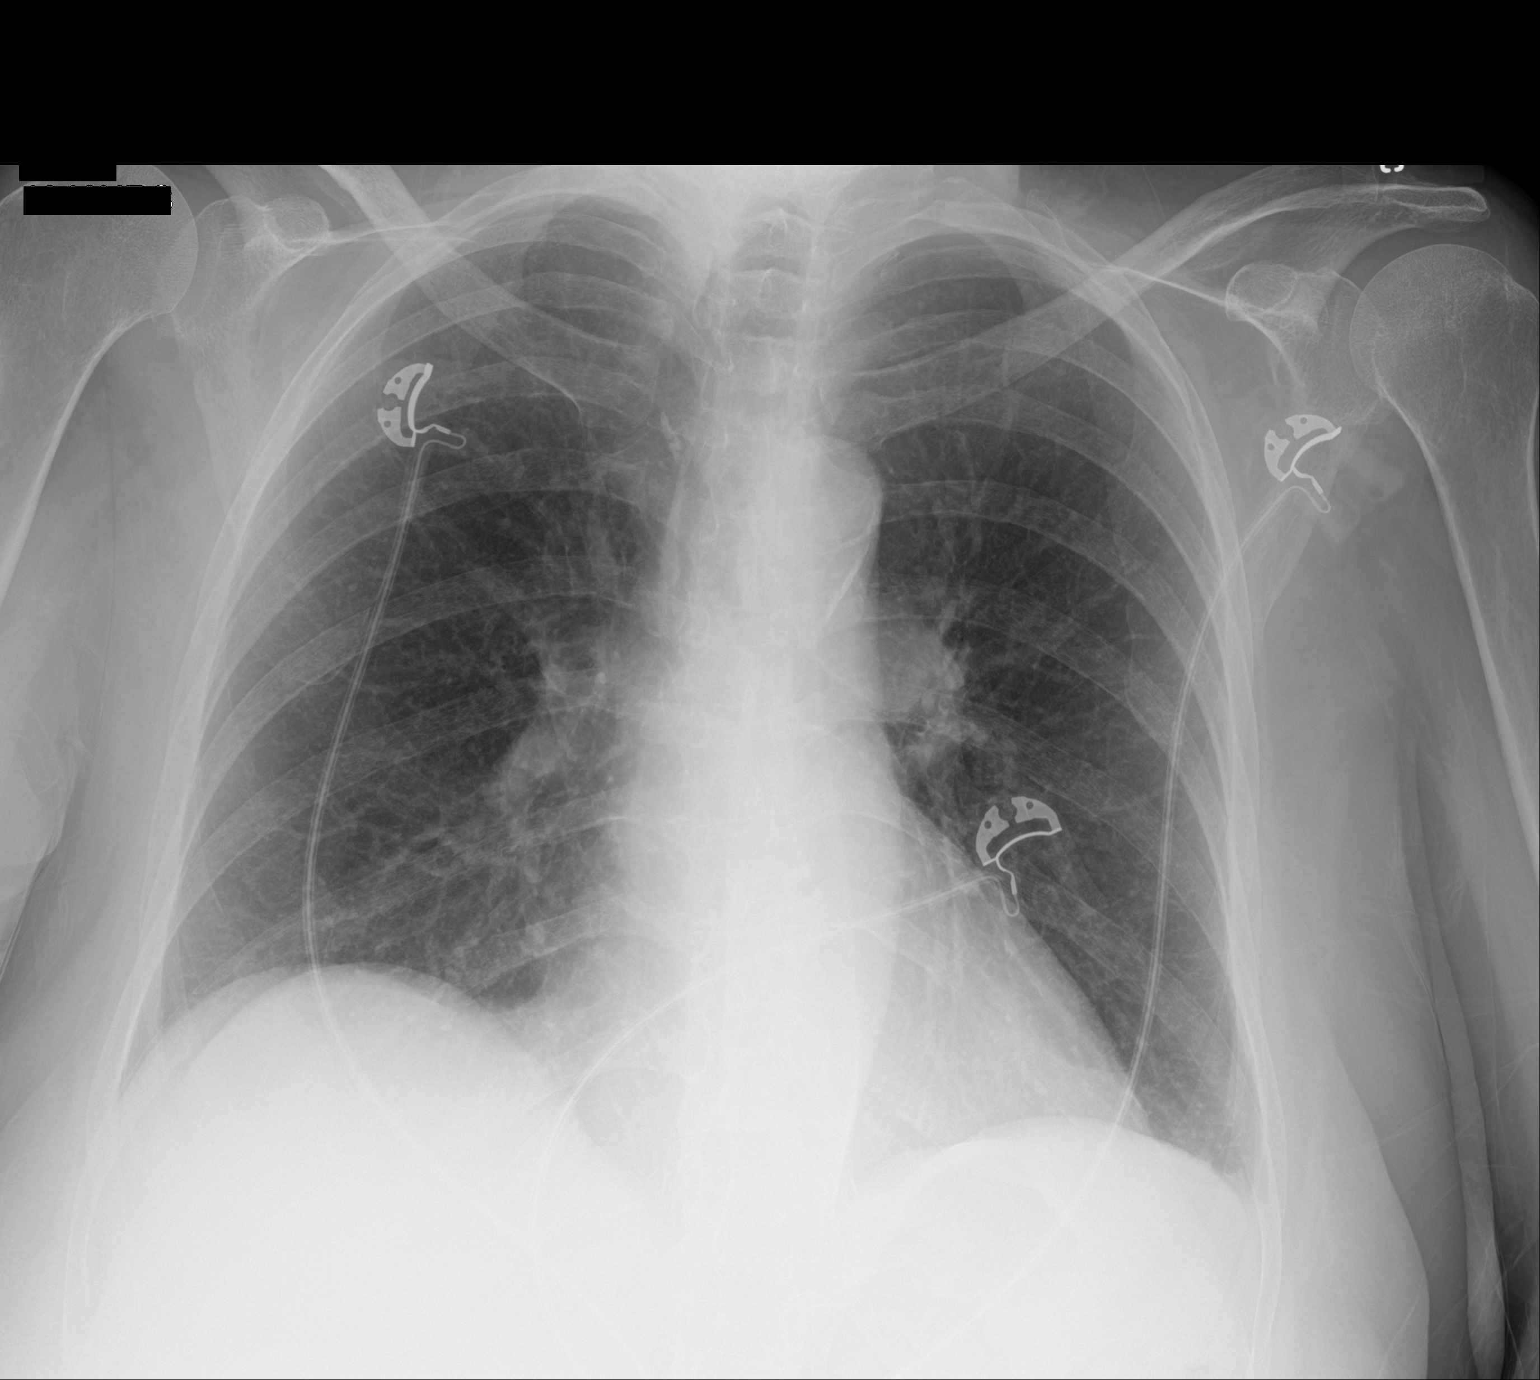

[1 of 1 positions shown; findings below may reference images not displayed]

FINDINGS: The heart size and mediastinal contours are within normal limits.
Both lungs are clear. The visualized skeletal structures are
unremarkable.
IMPRESSION: No active disease.

## 2017-01-07 ENCOUNTER — Other Ambulatory Visit: Payer: Self-pay | Admitting: Family Medicine

## 2017-01-07 MED ORDER — HYDROCODONE-ACETAMINOPHEN 10-325 MG PO TABS: 1.0000 | ORAL_TABLET | Freq: Three times a day (TID) | ORAL | 0 refills | Status: DC | PRN

## 2017-01-14 ENCOUNTER — Other Ambulatory Visit: Payer: Self-pay | Admitting: Family Medicine

## 2017-01-28 ENCOUNTER — Other Ambulatory Visit (INDEPENDENT_AMBULATORY_CARE_PROVIDER_SITE_OTHER): Payer: Self-pay | Admitting: Vascular Surgery

## 2017-01-28 DIAGNOSIS — I6523 Occlusion and stenosis of bilateral carotid arteries: Secondary | ICD-10-CM

## 2017-01-31 DIAGNOSIS — I5021 Acute systolic (congestive) heart failure: Secondary | ICD-10-CM | POA: Diagnosis not present

## 2017-02-04 ENCOUNTER — Encounter (INDEPENDENT_AMBULATORY_CARE_PROVIDER_SITE_OTHER): Payer: Medicare Other

## 2017-02-04 ENCOUNTER — Ambulatory Visit (INDEPENDENT_AMBULATORY_CARE_PROVIDER_SITE_OTHER): Payer: Medicare Other | Admitting: Vascular Surgery
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
## 2017-02-10 ENCOUNTER — Other Ambulatory Visit: Payer: Self-pay | Admitting: Family Medicine

## 2017-02-10 MED ORDER — HYDROCODONE-ACETAMINOPHEN 10-325 MG PO TABS: 1.0000 | ORAL_TABLET | Freq: Three times a day (TID) | ORAL | 0 refills | Status: DC | PRN

## 2017-02-12 ENCOUNTER — Inpatient Hospital Stay: Payer: Medicare Other

## 2017-02-12 ENCOUNTER — Inpatient Hospital Stay: Payer: Medicare Other | Admitting: Oncology

## 2017-03-02 DIAGNOSIS — I5021 Acute systolic (congestive) heart failure: Secondary | ICD-10-CM | POA: Diagnosis not present

## 2017-03-04 ENCOUNTER — Ambulatory Visit: Payer: Medicare Other | Admitting: Family Medicine

## 2017-03-04 ENCOUNTER — Ambulatory Visit: Payer: Self-pay

## 2017-03-10 ENCOUNTER — Other Ambulatory Visit: Payer: Self-pay | Admitting: Family Medicine

## 2017-03-10 MED ORDER — HYDROCODONE-ACETAMINOPHEN 10-325 MG PO TABS: 1.0000 | ORAL_TABLET | Freq: Three times a day (TID) | ORAL | 0 refills | Status: DC | PRN

## 2017-03-21 DIAGNOSIS — H2513 Age-related nuclear cataract, bilateral: Secondary | ICD-10-CM | POA: Diagnosis not present

## 2017-04-08 ENCOUNTER — Ambulatory Visit: Payer: Medicare Other | Admitting: Family Medicine

## 2017-04-08 ENCOUNTER — Encounter: Payer: Self-pay | Admitting: Family Medicine

## 2017-04-08 VITALS — BP 100/64 | HR 106 | Temp 97.3°F | Wt 130.0 lb

## 2017-04-08 DIAGNOSIS — M542 Cervicalgia: Secondary | ICD-10-CM

## 2017-04-08 DIAGNOSIS — Z23 Encounter for immunization: Secondary | ICD-10-CM | POA: Diagnosis not present

## 2017-04-08 DIAGNOSIS — M25511 Pain in right shoulder: Secondary | ICD-10-CM

## 2017-04-08 MED ORDER — PREDNISONE 50 MG PO TABS: 50.0000 mg | ORAL_TABLET | Freq: Every day | ORAL | 0 refills | Status: DC

## 2017-04-08 MED ORDER — HYDROCODONE-ACETAMINOPHEN 10-325 MG PO TABS: 1.0000 | ORAL_TABLET | Freq: Four times a day (QID) | ORAL | 0 refills | Status: DC | PRN

## 2017-04-08 NOTE — Patient Instructions (Addendum)
Pneumococcal Conjugate Vaccine (PCV13) What You Need to Know 1. Why get vaccinated? Vaccination can protect both children and adults from pneumococcal disease. Pneumococcal disease is caused by bacteria that can spread from person to person through close contact. It can cause ear infections, and it can also lead to more serious infections of the:  Lungs (pneumonia),  Blood (bacteremia), and  Covering of the brain and spinal cord (meningitis).  Pneumococcal pneumonia is most common among adults. Pneumococcal meningitis can cause deafness and brain damage, and it kills about 1 child in 10 who get it. Anyone can get pneumococcal disease, but children under 2 years of age and adults 65 years and older, people with certain medical conditions, and cigarette smokers are at the highest risk. Before there was a vaccine, the [place] saw:  more than 700 cases of meningitis,  about 13,000 blood infections,  about 5 million ear infections, and  about 200 deaths  in children under 5 each year from pneumococcal disease. Since vaccine became available, severe pneumococcal disease in these children has fallen by 88%. About 18,000 older adults die of pneumococcal disease each year in the [place]. Treatment of pneumococcal infections with penicillin and other drugs is not as effective as it used to be, because some strains of the disease have become resistant to these drugs. This makes prevention of the disease, through vaccination, even more important. 2. PCV13 vaccine Pneumococcal conjugate vaccine (called PCV13) protects against 13 types of pneumococcal bacteria. PCV13 is routinely given to children at 2, 4, 6, and 12-15 months of age. It is also recommended for children and adults 2 to 64 years of age with certain health conditions, and for all adults 65 years of age and older. Your doctor can give you details. 3. Some people should not get this vaccine Anyone who has ever had a  life-threatening allergic reaction to a dose of this vaccine, to an earlier pneumococcal vaccine called PCV7, or to any vaccine containing diphtheria toxoid (for example, DTaP), should not get PCV13. Anyone with a severe allergy to any component of PCV13 should not get the vaccine. Tell your doctor if the person being vaccinated has any severe allergies. If the person scheduled for vaccination is not feeling well, your healthcare provider might decide to reschedule the shot on another day. 4. Risks of a vaccine reaction With any medicine, including vaccines, there is a chance of reactions. These are usually mild and go away on their own, but serious reactions are also possible. Problems reported following PCV13 varied by age and dose in the series. The most common problems reported among children were:  About half became drowsy after the shot, had a temporary loss of appetite, or had redness or tenderness where the shot was given.  About 1 out of 3 had swelling where the shot was given.  About 1 out of 3 had a mild fever, and about 1 in 20 had a fever over 102.2F.  Up to about 8 out of 10 became fussy or irritable.  Adults have reported pain, redness, and swelling where the shot was given; also mild fever, fatigue, headache, chills, or muscle pain. Young children who get PCV13 along with inactivated flu vaccine at the same time may be at increased risk for seizures caused by fever. Ask your doctor for more information. Problems that could happen after any vaccine:  People sometimes faint after a medical procedure, including vaccination. Sitting or lying down for about 15 minutes can help prevent   fainting, and injuries caused by a fall. Tell your doctor if you feel dizzy, or have vision changes or ringing in the ears.  Some older children and adults get severe pain in the shoulder and have difficulty moving the arm where a shot was given. This happens very rarely.  Any medication can cause a  severe allergic reaction. Such reactions from a vaccine are very rare, estimated at about 1 in a million doses, and would happen within a few minutes to a few hours after the vaccination. As with any medicine, there is a very small chance of a vaccine causing a serious injury or death. The safety of vaccines is always being monitored. For more information, visit: www.cdc.gov/vaccinesafety/ 5. What if there is a serious reaction? What should I look for? Look for anything that concerns you, such as signs of a severe allergic reaction, very high fever, or unusual behavior. Signs of a severe allergic reaction can include hives, swelling of the face and throat, difficulty breathing, a fast heartbeat, dizziness, and weakness-usually within a few minutes to a few hours after the vaccination. What should I do?  If you think it is a severe allergic reaction or other emergency that can't wait, call 9-1-1 or get the person to the nearest hospital. Otherwise, call your doctor.  Reactions should be reported to the Vaccine Adverse Event Reporting System (VAERS). Your doctor should file this report, or you can do it yourself through the VAERS web site at www.vaers.hhs.gov, or by calling 1-800-822-7967. ? VAERS does not give medical advice. 6. The National Vaccine Injury Compensation Program The National Vaccine Injury Compensation Program (VICP) is a federal program that was created to compensate people who may have been injured by certain vaccines. Persons who believe they may have been injured by a vaccine can learn about the program and about filing a claim by calling 1-800-338-2382 or visiting the VICP website at www.hrsa.gov/vaccinecompensation. There is a time limit to file a claim for compensation. 7. How can I learn more?  Ask your healthcare provider. He or she can give you the vaccine package insert or suggest other sources of information.  Call your local or state health department.  Contact the  Centers for Disease Control and Prevention (CDC): ? Call 1-800-232-4636 (1-800-CDC-INFO) or ? Visit CDC's website at www.cdc.gov/vaccines Vaccine Information Statement, PCV13 Vaccine (03/10/2014) This information is not intended to replace advice given to you by your health care provider. Make sure you discuss any questions you have with your health care provider. Document Released: 02/17/2006 Document Revised: 01/11/2016 Document Reviewed: 01/11/2016 Elsevier Interactive Patient Education  2017 [employer] Influenza (Flu) Vaccine (Inactivated or Recombinant): What You Need to Know 1. Why get vaccinated? Influenza ("flu") is a contagious disease that spreads around the [place] every year, usually between October and May. Flu is caused by influenza viruses, and is spread mainly by coughing, sneezing, and close contact. Anyone can get flu. Flu strikes suddenly and can last several days. Symptoms vary by age, but can include:  fever/chills  sore throat  muscle aches  fatigue  cough  headache  runny or stuffy nose  Flu can also lead to pneumonia and blood infections, and cause diarrhea and seizures in children. If you have a medical condition, such as heart or lung disease, flu can make it worse. Flu is more dangerous for some people. Infants and young children, people 65 years of age and older, pregnant women, and people with certain health conditions or a weakened   immune system are at greatest risk. Each year thousands of people in the [place] die from flu, and many more are hospitalized. Flu vaccine can:  keep you from getting flu,  make flu less severe if you do get it, and  keep you from spreading flu to your family and other people. 2. Inactivated and recombinant flu vaccines A dose of flu vaccine is recommended every flu season. Children 6 months through 8 years of age may need two doses during the same flu season. Everyone else needs only one dose each flu  season. Some inactivated flu vaccines contain a very small amount of a mercury-based preservative called thimerosal. Studies have not shown thimerosal in vaccines to be harmful, but flu vaccines that do not contain thimerosal are available. There is no live flu virus in flu shots. They cannot cause the flu. There are many flu viruses, and they are always changing. Each year a new flu vaccine is made to protect against three or four viruses that are likely to cause disease in the upcoming flu season. But even when the vaccine doesn't exactly match these viruses, it may still provide some protection. Flu vaccine cannot prevent:  flu that is caused by a virus not covered by the vaccine, or  illnesses that look like flu but are not.  It takes about 2 weeks for protection to develop after vaccination, and protection lasts through the flu season. 3. Some people should not get this vaccine Tell the person who is giving you the vaccine:  If you have any severe, life-threatening allergies. If you ever had a life-threatening allergic reaction after a dose of flu vaccine, or have a severe allergy to any part of this vaccine, you may be advised not to get vaccinated. Most, but not all, types of flu vaccine contain a small amount of egg protein.  If you ever had Guillain-Barr Syndrome (also called GBS). Some people with a history of GBS should not get this vaccine. This should be discussed with your doctor.  If you are not feeling well. It is usually okay to get flu vaccine when you have a mild illness, but you might be asked to come back when you feel better.  4. Risks of a vaccine reaction With any medicine, including vaccines, there is a chance of reactions. These are usually mild and go away on their own, but serious reactions are also possible. Most people who get a flu shot do not have any problems with it. Minor problems following a flu shot include:  soreness, redness, or swelling where the shot  was given  hoarseness  sore, red or itchy eyes  cough  fever  aches  headache  itching  fatigue  If these problems occur, they usually begin soon after the shot and last 1 or 2 days. More serious problems following a flu shot can include the following:  There may be a small increased risk of Guillain-Barre Syndrome (GBS) after inactivated flu vaccine. This risk has been estimated at 1 or 2 additional cases per million people vaccinated. This is much lower than the risk of severe complications from flu, which can be prevented by flu vaccine.  Young children who get the flu shot along with pneumococcal vaccine (PCV13) and/or DTaP vaccine at the same time might be slightly more likely to have a seizure caused by fever. Ask your doctor for more information. Tell your doctor if a child who is getting flu vaccine has ever had a seizure.    Problems that could happen after any injected vaccine:  People sometimes faint after a medical procedure, including vaccination. Sitting or lying down for about 15 minutes can help prevent fainting, and injuries caused by a fall. Tell your doctor if you feel dizzy, or have vision changes or ringing in the ears.  Some people get severe pain in the shoulder and have difficulty moving the arm where a shot was given. This happens very rarely.  Any medication can cause a severe allergic reaction. Such reactions from a vaccine are very rare, estimated at about 1 in a million doses, and would happen within a few minutes to a few hours after the vaccination. As with any medicine, there is a very remote chance of a vaccine causing a serious injury or death. The safety of vaccines is always being monitored. For more information, visit: www.cdc.gov/vaccinesafety/ 5. What if there is a serious reaction? What should I look for? Look for anything that concerns you, such as signs of a severe allergic reaction, very high fever, or unusual behavior. Signs of a severe  allergic reaction can include hives, swelling of the face and throat, difficulty breathing, a fast heartbeat, dizziness, and weakness. These would start a few minutes to a few hours after the vaccination. What should I do?  If you think it is a severe allergic reaction or other emergency that can't wait, call 9-1-1 and get the person to the nearest hospital. Otherwise, call your doctor.  Reactions should be reported to the Vaccine Adverse Event Reporting System (VAERS). Your doctor should file this report, or you can do it yourself through the VAERS web site at www.vaers.hhs.gov, or by calling 1-800-822-7967. ? VAERS does not give medical advice. 6. The National Vaccine Injury Compensation Program The National Vaccine Injury Compensation Program (VICP) is a federal program that was created to compensate people who may have been injured by certain vaccines. Persons who believe they may have been injured by a vaccine can learn about the program and about filing a claim by calling 1-800-338-2382 or visiting the VICP website at www.hrsa.gov/vaccinecompensation. There is a time limit to file a claim for compensation. 7. How can I learn more?  Ask your healthcare provider. He or she can give you the vaccine package insert or suggest other sources of information.  Call your local or state health department.  Contact the Centers for Disease Control and Prevention (CDC): ? Call 1-800-232-4636 (1-800-CDC-INFO) or ? Visit CDC's website at www.cdc.gov/flu Vaccine Information Statement, Inactivated Influenza Vaccine (12/10/2013) This information is not intended to replace advice given to you by your health care provider. Make sure you discuss any questions you have with your health care provider. Document Released: 02/14/2006 Document Revised: 01/11/2016 Document Reviewed: 01/11/2016 Elsevier Interactive Patient Education  2017 Elsevier Inc.  

## 2017-04-08 NOTE — Progress Notes (Signed)
BP 100/64 (BP Location: Left Arm, Patient Position: Sitting, Cuff Size: Normal)   Pulse (!) 106   Temp (!) 97.3 F (36.3 C)   Wt 130 lb (59 kg)   SpO2 96%   BMI 22.53 kg/m    Subjective:    Patient ID: [name], female    DOB: [birth year], 66 y.o.   MRN: [id number]  HPI: IMANIE DARROW is a 66 y.o. female  Chief Complaint  Patient presents with  . Shoulder Pain    right   SHOULDER PAIN Duration: Couple of weeks Involved shoulder: right Mechanism of injury: unknown Location: diffuse- in the back and into her ear and into the back of her head Onset:sudden Severity: severe  Quality:  Aching and cramping Frequency: constant Radiation: yes Aggravating factors: sitting   Alleviating factors: pain medicine   Status: stable Treatments attempted: rest, ice, heat, APAP, ibuprofen and aleve  Relief with NSAIDs?:  no Weakness: no Numbness: no Decreased grip strength: no Redness: no Swelling: yes Bruising: no Fevers: no  Relevant past medical, surgical, family and social history reviewed and updated as indicated. Interim medical history since our last visit reviewed. Allergies and medications reviewed and updated.  Review of Systems  Constitutional: Negative.   Respiratory: Negative.   Cardiovascular: Negative.   Musculoskeletal: Positive for arthralgias, myalgias, neck pain and neck stiffness. Negative for back pain, gait problem and joint swelling.  Neurological: Negative.   Psychiatric/Behavioral: Negative.     Per HPI unless specifically indicated above     Objective:    BP 100/64 (BP Location: Left Arm, Patient Position: Sitting, Cuff Size: Normal)   Pulse (!) 106   Temp (!) 97.3 F (36.3 C)   Wt 130 lb (59 kg)   SpO2 96%   BMI 22.53 kg/m   Wt Readings from Last 3 Encounters:  04/08/17 130 lb (59 kg)  12/03/16 136 lb 5 oz (61.8 kg)  11/20/16 137 lb 9.6 oz (62.4 kg)    Physical Exam  Constitutional: She is oriented to person, place, and  time. She appears well-developed and well-nourished. No distress.  HENT:  Head: Normocephalic and atraumatic.  Right Ear: Hearing normal.  Left Ear: Hearing normal.  Nose: Nose normal.  Eyes: Conjunctivae and lids are normal. Right eye exhibits no discharge. Left eye exhibits no discharge. No scleral icterus.  Cardiovascular: Normal rate, regular rhythm, normal heart sounds and intact distal pulses. Exam reveals no gallop and no friction rub.  No murmur heard. Pulmonary/Chest: Effort normal and breath sounds normal. No respiratory distress.  Musculoskeletal: Normal range of motion.  Neurological: She is alert and oriented to person, place, and time.  Skin: Skin is intact. No rash noted.  Psychiatric: She has a normal mood and affect. Her speech is normal and behavior is normal. Judgment and thought content normal. Cognition and memory are normal.     Shoulder: right    Inspection:  no swelling, ecchymosis, erythema or step off deformity.     Tenderness to Palpation:    Acromion: no    AC joint:no    Clavicle: no    Bicipital groove: no    Scapular spine: yes    Coracoid process: no    Humeral head: no    Supraspinatus tendon: yes     Range of Motion: Decreased    Abduction:Decreased    Adduction: Normal    Flexion: Decreased    Extension: Decreased    Internal rotation: Normal    External rotation:  Normal    Painful arc: yes     Muscle Strength: 5/5 bilaterally     Neuro: Sensation WNL. and Upper extremity reflexes WNL.     Special Tests:     Neer sign: Negative    Hawkins sign: Positive    Cross arm adduction: Negative    Yergason sign: Negative    O'brien sign: Negative     Speed sign: Negative Spurlings: Negative bilaterally  Results for orders placed or performed in visit on 12/03/16  Microscopic Examination  Result Value Ref Range   WBC, UA 0-5 0 - 5 /hpf   RBC, UA None seen 0 - 2 /hpf   Epithelial Cells (non renal) 0-10 0 - 10 /hpf   Bacteria, UA None seen  None seen/Few  CBC with Differential/Platelet  Result Value Ref Range   WBC 7.4 3.4 - 10.8 x10E3/uL   RBC 4.31 3.77 - 5.28 x10E6/uL   Hemoglobin 10.7 (L) 11.1 - 15.9 g/dL   Hematocrit 37.7 34.0 - 46.6 %   MCV 88 79 - 97 fL   MCH 24.8 (L) 26.6 - 33.0 pg   MCHC 28.4 (L) 31.5 - 35.7 g/dL   RDW 25.1 (H) 12.3 - 15.4 %   Platelets 372 150 - 379 x10E3/uL   Neutrophils 68 Not Estab. %   Lymphs 18 Not Estab. %   Monocytes 6 Not Estab. %   Eos 6 Not Estab. %   Basos 1 Not Estab. %   Neutrophils Absolute 5.1 1.4 - 7.0 x10E3/uL   Lymphocytes Absolute 1.4 0.7 - 3.1 x10E3/uL   Monocytes Absolute 0.4 0.1 - 0.9 x10E3/uL   EOS (ABSOLUTE) 0.4 0.0 - 0.4 x10E3/uL   Basophils Absolute 0.0 0.0 - 0.2 x10E3/uL   Immature Granulocytes 1 Not Estab. %   Immature Grans (Abs) 0.1 0.0 - 0.1 x10E3/uL   Hematology Comments: Note:   Comprehensive metabolic panel  Result Value Ref Range   Glucose 73 65 - 99 mg/dL   BUN 14 8 - 27 mg/dL   Creatinine, Ser 0.81 0.57 - 1.00 mg/dL   GFR calc non Af Amer 76 >59 mL/min/1.73   GFR calc Af Amer 88 >59 mL/min/1.73   BUN/Creatinine Ratio 17 12 - 28   Sodium 141 134 - 144 mmol/L   Potassium 4.5 3.5 - 5.2 mmol/L   Chloride 107 (H) 96 - 106 mmol/L   CO2 23 20 - 29 mmol/L   Calcium 8.5 (L) 8.7 - 10.3 mg/dL   Total Protein 5.4 (L) 6.0 - 8.5 g/dL   Albumin 3.4 (L) 3.6 - 4.8 g/dL   Globulin, Total 2.0 1.5 - 4.5 g/dL   Albumin/Globulin Ratio 1.7 1.2 - 2.2   Bilirubin Total <0.2 0.0 - 1.2 mg/dL   Alkaline Phosphatase 67 39 - 117 IU/L   AST 16 0 - 40 IU/L   ALT 5 0 - 32 IU/L  Lipid Panel w/o Chol/HDL Ratio  Result Value Ref Range   Cholesterol, Total 169 100 - 199 mg/dL   Triglycerides 134 0 - 149 mg/dL   HDL 47 >39 mg/dL   VLDL Cholesterol Cal 27 5 - 40 mg/dL   LDL Calculated 95 0 - 99 mg/dL  Microalbumin, Urine Waived  Result Value Ref Range   Microalb, Ur Waived 30 (H) 0 - 19 mg/L   Creatinine, Urine Waived 100 10 - 300 mg/dL   Microalb/Creat Ratio 30-300 (H)  <30 mg/g  TSH  Result Value Ref Range   TSH 2.090 0.450 -  4.500 uIU/mL  UA/M w/rflx Culture, Routine  Result Value Ref Range   Specific Gravity, UA 1.020 1.005 - 1.030   pH, UA 5.5 5.0 - 7.5   Color, UA Yellow Yellow   Appearance Ur Clear Clear   Leukocytes, UA Trace (A) Negative   Protein, UA Negative Negative/Trace   Glucose, UA Negative Negative   Ketones, UA Negative Negative   RBC, UA Negative Negative   Bilirubin, UA Negative Negative   Urobilinogen, Ur 0.2 0.2 - 1.0 mg/dL   Nitrite, UA Negative Negative   Microscopic Examination See below:   VITAMIN D 25 Hydroxy (Vit-D Deficiency, Fractures)  Result Value Ref Range   Vit D, 25-Hydroxy 18.5 (L) 30.0 - 100.0 ng/mL  Iron and TIBC  Result Value Ref Range   Total Iron Binding Capacity 262 250 - 450 ug/dL   UIBC 43 (L) 118 - 369 ug/dL   Iron 219 (H) 27 - 139 ug/dL   Iron Saturation 84 (HH) 15 - 55 %  Ferritin  Result Value Ref Range   Ferritin 373 (H) 15 - 150 ng/mL      Assessment & Plan:   Problem List Items Addressed This Visit    None    Visit Diagnoses    Acute pain of right shoulder    -  Primary   Will check x-rays of neck and shoulder. Start prednisone. Increase pain medicine for 1 month. Rx given. Await results. Call with any concerns.    Relevant Orders   DG Shoulder Right   Neck pain       Will check x-rays of neck and shoulder. Start prednisone. Increase pain medicine for 1 month. Rx given. Await results. Call with any concerns.    Relevant Orders   DG Cervical Spine Complete   Immunization due       Flu and Prevnar given today.   Relevant Orders   Pneumococcal conjugate vaccine 13-valent (Completed)   Flu vaccine HIGH DOSE PF (Fluzone High dose) (Completed)       Follow up plan: Return As scheduled.

## 2017-04-09 ENCOUNTER — Telehealth: Payer: Self-pay | Admitting: Family Medicine

## 2017-04-09 NOTE — Telephone Encounter (Signed)
Noted  

## 2017-04-09 NOTE — Telephone Encounter (Signed)
Copied from [place] [phone]. Topic: Inquiry >> Apr 08, 2017  5:04 PM Corie Chiquito, [place] wrote: Reason for CRM: Dr.Alkaldi called to leave a msg for Dr.Marcus Groll because this patient is enrolled with the [facility] and they provide home care for the patients and family but it is not to replace the pcp. If she has any questions about the services please give her a call at [phone]. Dr.Alkaldi will be sending over a visit notes as well.

## 2017-05-08 ENCOUNTER — Other Ambulatory Visit: Payer: Self-pay | Admitting: Family Medicine

## 2017-05-08 MED ORDER — HYDROCODONE-ACETAMINOPHEN 10-325 MG PO TABS: 1.0000 | ORAL_TABLET | Freq: Four times a day (QID) | ORAL | 0 refills | Status: DC | PRN

## 2017-05-15 ENCOUNTER — Other Ambulatory Visit: Payer: Self-pay | Admitting: Family Medicine

## 2017-05-15 MED ORDER — HYDROCODONE-ACETAMINOPHEN 10-325 MG PO TABS: 1.0000 | ORAL_TABLET | Freq: Four times a day (QID) | ORAL | 0 refills | Status: DC | PRN
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
## 2017-05-27 ENCOUNTER — Inpatient Hospital Stay: Payer: Medicare Other

## 2017-05-27 ENCOUNTER — Inpatient Hospital Stay (HOSPITAL_BASED_OUTPATIENT_CLINIC_OR_DEPARTMENT_OTHER): Payer: Medicare Other | Admitting: Oncology

## 2017-05-27 ENCOUNTER — Inpatient Hospital Stay: Payer: Medicare Other | Attending: Oncology

## 2017-05-27 VITALS — BP 120/63 | HR 90 | Temp 99.4°F | Resp 18 | Wt 131.1 lb

## 2017-05-27 DIAGNOSIS — Z720 Tobacco use: Secondary | ICD-10-CM

## 2017-05-27 DIAGNOSIS — D500 Iron deficiency anemia secondary to blood loss (chronic): Secondary | ICD-10-CM

## 2017-05-27 DIAGNOSIS — R5383 Other fatigue: Secondary | ICD-10-CM | POA: Diagnosis not present

## 2017-05-27 DIAGNOSIS — M2550 Pain in unspecified joint: Secondary | ICD-10-CM

## 2017-05-27 DIAGNOSIS — D509 Iron deficiency anemia, unspecified: Secondary | ICD-10-CM | POA: Insufficient documentation

## 2017-05-27 DIAGNOSIS — R531 Weakness: Secondary | ICD-10-CM | POA: Diagnosis not present

## 2017-05-27 DIAGNOSIS — M542 Cervicalgia: Secondary | ICD-10-CM

## 2017-05-27 LAB — CBC WITH DIFFERENTIAL/PLATELET
Basophils Absolute: 0.2 10*3/uL — ABNORMAL HIGH (ref 0–0.1)
Basophils Relative: 2 %
Eosinophils Absolute: 0.7 10*3/uL (ref 0–0.7)
Eosinophils Relative: 7 %
HCT: 28.2 % — ABNORMAL LOW (ref 35.0–47.0)
HEMOGLOBIN: 8.4 g/dL — AB (ref 12.0–16.0)
LYMPHS ABS: 1.9 10*3/uL (ref 1.0–3.6)
LYMPHS PCT: 19 %
MCH: 21.2 pg — AB (ref 26.0–34.0)
MCHC: 29.9 g/dL — ABNORMAL LOW (ref 32.0–36.0)
MCV: 71 fL — AB (ref 80.0–100.0)
MONOS PCT: 6 %
Monocytes Absolute: 0.6 10*3/uL (ref 0.2–0.9)
Neutro Abs: 6.9 10*3/uL — ABNORMAL HIGH (ref 1.4–6.5)
Neutrophils Relative %: 68 %
Platelets: 436 10*3/uL (ref 150–440)
RBC: 3.97 MIL/uL (ref 3.80–5.20)
RDW: 19.9 % — ABNORMAL HIGH (ref 11.5–14.5)
WBC: 10.2 10*3/uL (ref 3.6–11.0)

## 2017-05-27 LAB — IRON AND TIBC
IRON: 7 ug/dL — AB (ref 28–170)
Saturation Ratios: 2 % — ABNORMAL LOW (ref 10.4–31.8)
TIBC: 347 ug/dL (ref 250–450)
UIBC: 341 ug/dL

## 2017-05-27 LAB — FERRITIN: FERRITIN: 3 ng/mL — AB (ref 11–307)

## 2017-05-27 MED ORDER — SODIUM CHLORIDE 0.9 % IV SOLN
510.0000 mg | Freq: Once | INTRAVENOUS | Status: AC
  Administered 2017-05-27: 510 mg via INTRAVENOUS
  Filled 2017-05-27: qty 17

## 2017-05-27 MED ORDER — SODIUM CHLORIDE 0.9 % IV SOLN
INTRAVENOUS | Status: DC
  Administered 2017-05-27: 15:00:00 via INTRAVENOUS
  Filled 2017-05-27: qty 1000

## 2017-06-04 ENCOUNTER — Inpatient Hospital Stay: Payer: Medicare Other

## 2017-06-04 VITALS — BP 115/71 | HR 86 | Temp 98.7°F | Resp 20

## 2017-06-04 DIAGNOSIS — D509 Iron deficiency anemia, unspecified: Secondary | ICD-10-CM | POA: Diagnosis not present

## 2017-06-04 DIAGNOSIS — D500 Iron deficiency anemia secondary to blood loss (chronic): Secondary | ICD-10-CM

## 2017-06-04 MED ORDER — SODIUM CHLORIDE 0.9 % IV SOLN
510.0000 mg | Freq: Once | INTRAVENOUS | Status: AC
  Administered 2017-06-04: 510 mg via INTRAVENOUS
  Filled 2017-06-04: qty 17

## 2017-06-04 MED ORDER — SODIUM CHLORIDE 0.9 % IV SOLN
Freq: Once | INTRAVENOUS | Status: AC
  Administered 2017-06-04: 13:00:00 via INTRAVENOUS
  Filled 2017-06-04: qty 1000

## 2017-06-12 ENCOUNTER — Other Ambulatory Visit: Payer: Self-pay | Admitting: Family Medicine

## 2017-06-12 MED ORDER — HYDROCODONE-ACETAMINOPHEN 10-325 MG PO TABS: 1.0000 | ORAL_TABLET | Freq: Four times a day (QID) | ORAL | 0 refills | Status: DC | PRN

## 2017-06-18 ENCOUNTER — Encounter: Payer: Self-pay | Admitting: Physician Assistant
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
## 2017-06-19 ENCOUNTER — Ambulatory Visit: Payer: Medicare Other | Admitting: Physician Assistant

## 2017-06-19 ENCOUNTER — Encounter: Payer: Self-pay | Admitting: Physician Assistant

## 2017-06-19 VITALS — BP 118/56 | HR 91 | Ht 65.0 in | Wt 136.0 lb

## 2017-06-19 DIAGNOSIS — D509 Iron deficiency anemia, unspecified: Secondary | ICD-10-CM

## 2017-06-19 DIAGNOSIS — I2510 Atherosclerotic heart disease of native coronary artery without angina pectoris: Secondary | ICD-10-CM

## 2017-06-19 DIAGNOSIS — R002 Palpitations: Secondary | ICD-10-CM

## 2017-06-19 DIAGNOSIS — I10 Essential (primary) hypertension: Secondary | ICD-10-CM

## 2017-06-19 DIAGNOSIS — R5383 Other fatigue: Secondary | ICD-10-CM | POA: Diagnosis not present

## 2017-06-19 DIAGNOSIS — M7989 Other specified soft tissue disorders: Secondary | ICD-10-CM | POA: Diagnosis not present

## 2017-06-19 DIAGNOSIS — I428 Other cardiomyopathies: Secondary | ICD-10-CM

## 2017-06-19 DIAGNOSIS — R0602 Shortness of breath: Secondary | ICD-10-CM | POA: Diagnosis not present

## 2017-06-19 MED ORDER — FUROSEMIDE 40 MG PO TABS: ORAL_TABLET | ORAL | 3 refills | Status: DC

## 2017-06-19 NOTE — Patient Instructions (Signed)
Medication Instructions:  Your physician has recommended you make the following change in your medication:  1- TAKE Furosemide 40 mg by mouth two times a day for 3 days, then take 40 mg by mouth once a day.   Labwork: Your physician recommends that you return for lab work in: TODAY (CBC, CMET, TSH, BNP).   Testing/Procedures: Your physician has requested that you have an echocardiogram. Echocardiography is a painless test that uses sound waves to create images of your heart. It provides your doctor with information about the size and shape of your heart and how well your heart's chambers and valves are working. This procedure takes approximately one hour. There are no restrictions for this procedure. - Before appointment with Thurmond Butts next week. Follow by ZIO placement.   Your physician has recommended that you wear an 14 DAY ZIO event monitor. Event monitors are medical devices that record the heart's electrical activity. Doctors most often [place] these monitors to diagnose arrhythmias. Arrhythmias are problems with the speed or rhythm of the heartbeat. The monitor is a small, portable device. You can wear one while you do your normal daily activities. This is usually used to diagnose what is causing palpitations/syncope (passing out). - PLEASE schedule patient to have placed after echo is completed.   A Zio Patch Event Heart monitor will be applied to your chest today.  You will wear the patch for 14 days. After 24 hours, you may shower with the heart monitor on. If you feel any symptoms, you may press and release the button in the middle of the monitor.      Follow-Up: Your physician recommends that you schedule a follow-up appointment in: ON NEXT [place].   If you need a refill on your cardiac medications before your next appointment, please call your pharmacy.    Echocardiogram An echocardiogram, or echocardiography, uses sound waves (ultrasound) to produce an image of  your heart. The echocardiogram is simple, painless, obtained within a short period of time, and offers valuable information to your health care provider. The images from an echocardiogram can provide information such as:  Evidence of coronary artery disease (CAD).  Heart size.  Heart muscle function.  Heart valve function.  Aneurysm detection.  Evidence of a past heart attack.  Fluid buildup around the heart.  Heart muscle thickening.  Assess heart valve function.  Tell a health care provider about:  Any allergies you have.  All medicines you are taking, including vitamins, herbs, eye drops, creams, and over-the-counter medicines.  Any problems you or family members have had with anesthetic medicines.  Any blood disorders you have.  Any surgeries you have had.  Any medical conditions you have.  Whether you are pregnant or may be pregnant. What happens before the procedure? No special preparation is needed. Eat and drink normally. What happens during the procedure?  In order to produce an image of your heart, gel will be applied to your chest and a wand-like tool (transducer) will be moved over your chest. The gel will help transmit the sound waves from the transducer. The sound waves will harmlessly bounce off your heart to allow the heart images to be captured in real-time motion. These images will then be recorded.  You may need an IV to receive a medicine that improves the quality of the pictures. What happens after the procedure? You may return to your normal schedule including diet, activities, and medicines, unless your health care provider tells you otherwise. This information  is not intended to replace advice given to you by your health care provider. Make sure you discuss any questions you have with your health care provider. Document Released: 04/19/2000 Document Revised: 12/09/2015 Document Reviewed: 12/28/2012 Elsevier Interactive Patient Education  2017  [employer].

## 2017-06-20 ENCOUNTER — Ambulatory Visit (INDEPENDENT_AMBULATORY_CARE_PROVIDER_SITE_OTHER): Payer: Medicare Other

## 2017-06-20 ENCOUNTER — Other Ambulatory Visit: Payer: Self-pay

## 2017-06-20 ENCOUNTER — Telehealth: Payer: Self-pay | Admitting: Physician Assistant

## 2017-06-20 ENCOUNTER — Ambulatory Visit: Payer: Medicare Other

## 2017-06-20 DIAGNOSIS — I428 Other cardiomyopathies: Secondary | ICD-10-CM

## 2017-06-20 DIAGNOSIS — R002 Palpitations: Secondary | ICD-10-CM

## 2017-06-20 DIAGNOSIS — R0602 Shortness of breath: Secondary | ICD-10-CM

## 2017-06-20 LAB — CBC WITH DIFFERENTIAL/PLATELET
BASOS: 1 %
Basophils Absolute: 0.1 10*3/uL (ref 0.0–0.2)
EOS (ABSOLUTE): 0.4 10*3/uL (ref 0.0–0.4)
EOS: 7 %
HEMOGLOBIN: 11.9 g/dL (ref 11.1–15.9)
Hematocrit: 39.3 % (ref 34.0–46.6)
IMMATURE GRANULOCYTES: 1 %
Immature Grans (Abs): 0 10*3/uL (ref 0.0–0.1)
LYMPHS: 23 %
Lymphocytes Absolute: 1.4 10*3/uL (ref 0.7–3.1)
MCH: 25.2 pg — ABNORMAL LOW (ref 26.6–33.0)
MCHC: 30.3 g/dL — AB (ref 31.5–35.7)
MCV: 83 fL (ref 79–97)
MONOCYTES: 5 %
MONOS ABS: 0.3 10*3/uL (ref 0.1–0.9)
NEUTROS PCT: 63 %
Neutrophils Absolute: 4.1 10*3/uL (ref 1.4–7.0)
Platelets: 353 10*3/uL (ref 150–379)
RBC: 4.73 x10E6/uL (ref 3.77–5.28)
RDW: 29.7 % — ABNORMAL HIGH (ref 12.3–15.4)
WBC: 6.4 10*3/uL (ref 3.4–10.8)

## 2017-06-20 LAB — COMPREHENSIVE METABOLIC PANEL
ALK PHOS: 64 IU/L (ref 39–117)
ALT: 8 IU/L (ref 0–32)
AST: 15 IU/L (ref 0–40)
Albumin/Globulin Ratio: 1.6 (ref 1.2–2.2)
Albumin: 3.2 g/dL — ABNORMAL LOW (ref 3.6–4.8)
BUN/Creatinine Ratio: 20 (ref 12–28)
BUN: 18 mg/dL (ref 8–27)
Bilirubin Total: <0.2 mg/dL (ref 0.0–1.2)
CALCIUM: 8.7 mg/dL (ref 8.7–10.3)
CO2: 19 mmol/L — AB (ref 20–29)
CREATININE: 0.89 mg/dL (ref 0.57–1.00)
Chloride: 110 mmol/L — ABNORMAL HIGH (ref 96–106)
GFR calc Af Amer: 78 mL/min/{1.73_m2} (ref >59)
GFR, EST NON AFRICAN AMERICAN: 67 mL/min/{1.73_m2} (ref >59)
GLUCOSE: 86 mg/dL (ref 65–99)
Globulin, Total: 2 g/dL (ref 1.5–4.5)
Potassium: 5.1 mmol/L (ref 3.5–5.2)
Sodium: 146 mmol/L — ABNORMAL HIGH (ref 134–144)
Total Protein: 5.2 g/dL — ABNORMAL LOW (ref 6.0–8.5)

## 2017-06-20 LAB — TSH: TSH: 1 u[IU]/mL (ref 0.450–4.500)

## 2017-06-20 LAB — BRAIN NATRIURETIC PEPTIDE: BNP: 366.2 pg/mL — AB (ref 0.0–100.0)

## 2017-06-20 NOTE — Telephone Encounter (Signed)
Zio monitor [id number] placed on patient today. Successfully entered into Zio Reports.
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
## 2017-06-26 ENCOUNTER — Ambulatory Visit: Payer: Medicare Other | Admitting: Physician Assistant

## 2017-06-26 ENCOUNTER — Encounter: Payer: Self-pay | Admitting: Physician Assistant

## 2017-06-26 ENCOUNTER — Other Ambulatory Visit
Admission: RE | Admit: 2017-06-26 | Discharge: 2017-06-26 | Disposition: A | Discharge status: Home / Self Care | Payer: Medicare Other | Source: Ambulatory Visit | Attending: Physician Assistant | Admitting: Physician Assistant

## 2017-06-26 VITALS — BP 118/60 | HR 95 | Ht 65.0 in | Wt 132.5 lb

## 2017-06-26 DIAGNOSIS — I340 Nonrheumatic mitral (valve) insufficiency: Secondary | ICD-10-CM | POA: Diagnosis not present

## 2017-06-26 DIAGNOSIS — R002 Palpitations: Secondary | ICD-10-CM

## 2017-06-26 DIAGNOSIS — I2510 Atherosclerotic heart disease of native coronary artery without angina pectoris: Secondary | ICD-10-CM

## 2017-06-26 DIAGNOSIS — Z79899 Other long term (current) drug therapy: Secondary | ICD-10-CM | POA: Diagnosis present

## 2017-06-26 DIAGNOSIS — I5043 Acute on chronic combined systolic (congestive) and diastolic (congestive) heart failure: Secondary | ICD-10-CM

## 2017-06-26 DIAGNOSIS — I428 Other cardiomyopathies: Secondary | ICD-10-CM

## 2017-06-26 DIAGNOSIS — D509 Iron deficiency anemia, unspecified: Secondary | ICD-10-CM | POA: Diagnosis not present

## 2017-06-26 DIAGNOSIS — I10 Essential (primary) hypertension: Secondary | ICD-10-CM | POA: Insufficient documentation

## 2017-06-26 DIAGNOSIS — M7989 Other specified soft tissue disorders: Secondary | ICD-10-CM | POA: Diagnosis not present

## 2017-06-26 LAB — BASIC METABOLIC PANEL
Anion gap: 10 (ref 5–15)
BUN: 21 mg/dL — AB (ref 6–20)
CALCIUM: 8.7 mg/dL — AB (ref 8.9–10.3)
CO2: 25 mmol/L (ref 22–32)
Chloride: 106 mmol/L (ref 101–111)
Creatinine, Ser: 0.83 mg/dL (ref 0.44–1.00)
GFR calc Af Amer: >60 mL/min (ref >60)
GFR calc non Af Amer: >60 mL/min (ref >60)
GLUCOSE: 132 mg/dL — AB (ref 65–99)
POTASSIUM: 3.9 mmol/L (ref 3.5–5.1)
SODIUM: 141 mmol/L (ref 135–145)

## 2017-06-26 MED ORDER — LISINOPRIL 2.5 MG PO TABS: 2.5000 mg | ORAL_TABLET | Freq: Every day | ORAL | 3 refills | Status: DC

## 2017-06-26 MED ORDER — TORSEMIDE 20 MG PO TABS: 20.0000 mg | ORAL_TABLET | Freq: Two times a day (BID) | ORAL | 3 refills | Status: DC

## 2017-06-26 NOTE — Patient Instructions (Signed)
Medication Instructions:  Your physician has recommended you make the following change in your medication:  1- STOP Lasix. 2- START Torsemide 20 mg (1 tablet) by mouth two times a day. 3- START Lisinopril 2.5 mg (1 tablet) by mouth once a day.   Labwork: Your physician recommends that you return for lab work in: [place] (bmet) - Please go to the [facility] You will check in at the front desk to the right as you walk into the atrium. Valet Parking is offered if needed.     Testing/Procedures: none  Follow-Up: Your physician recommends that you schedule a follow-up appointment in: [place].   If you need a refill on your cardiac medications before your next appointment, please call your pharmacy.

## 2017-06-27 ENCOUNTER — Other Ambulatory Visit: Payer: Self-pay

## 2017-06-27 MED ORDER — POTASSIUM CHLORIDE ER 10 MEQ PO TBCR: 10.0000 meq | EXTENDED_RELEASE_TABLET | Freq: Two times a day (BID) | ORAL | 3 refills | Status: DC

## 2017-06-30 ENCOUNTER — Telehealth: Payer: Self-pay | Admitting: Physician Assistant

## 2017-06-30 NOTE — Telephone Encounter (Signed)
Zio monitor [id number] placed on patient and registered on Feb 15
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
## 2017-07-02 ENCOUNTER — Other Ambulatory Visit: Payer: Self-pay | Admitting: Family Medicine

## 2017-07-02 MED ORDER — HYDROCODONE-ACETAMINOPHEN 10-325 MG PO TABS: 1.0000 | ORAL_TABLET | Freq: Four times a day (QID) | ORAL | 0 refills | Status: DC | PRN

## 2017-07-03 ENCOUNTER — Encounter: Payer: Self-pay | Admitting: Physician Assistant

## 2017-07-03 ENCOUNTER — Ambulatory Visit (INDEPENDENT_AMBULATORY_CARE_PROVIDER_SITE_OTHER): Payer: Medicare Other | Admitting: Physician Assistant

## 2017-07-03 VITALS — BP 118/56 | HR 90 | Ht 65.0 in | Wt 130.2 lb

## 2017-07-03 DIAGNOSIS — I5042 Chronic combined systolic (congestive) and diastolic (congestive) heart failure: Secondary | ICD-10-CM

## 2017-07-03 DIAGNOSIS — M7989 Other specified soft tissue disorders: Secondary | ICD-10-CM | POA: Diagnosis not present

## 2017-07-03 DIAGNOSIS — I2510 Atherosclerotic heart disease of native coronary artery without angina pectoris: Secondary | ICD-10-CM

## 2017-07-03 DIAGNOSIS — I428 Other cardiomyopathies: Secondary | ICD-10-CM | POA: Diagnosis not present

## 2017-07-03 DIAGNOSIS — I739 Peripheral vascular disease, unspecified: Secondary | ICD-10-CM | POA: Diagnosis not present

## 2017-07-03 DIAGNOSIS — I10 Essential (primary) hypertension: Secondary | ICD-10-CM

## 2017-07-03 DIAGNOSIS — R002 Palpitations: Secondary | ICD-10-CM

## 2017-07-03 NOTE — Patient Instructions (Signed)
Medication Instructions:  Your physician recommends that you continue on your current medications as directed. Please refer to the Current Medication list given to you today.   Labwork: Your physician recommends that you return for lab work in: [place] (BMET).   Testing/Procedures: Your physician has requested that you have a lower extremity arterial doppler- During this test, ultrasound is used to evaluate arterial blood flow in the legs. Allow approximately one hour for this exam.   Your physician has requested that you have an ankle brachial index (ABI). During this test an ultrasound and blood pressure cuff are used to evaluate the arteries that supply the arms and legs with blood. Allow thirty minutes for this exam. There are no restrictions or special instructions.    Follow-Up: Your physician recommends that you schedule a follow-up appointment in: [place].  If you need a refill on your cardiac medications before your next appointment, please call your pharmacy.

## 2017-07-08 ENCOUNTER — Telehealth: Payer: Self-pay | Admitting: Physician Assistant

## 2017-07-08 DIAGNOSIS — Z79899 Other long term (current) drug therapy: Secondary | ICD-10-CM

## 2017-07-08 DIAGNOSIS — I5033 Acute on chronic diastolic (congestive) heart failure: Secondary | ICD-10-CM

## 2017-07-08 LAB — BASIC METABOLIC PANEL

## 2017-07-08 NOTE — Telephone Encounter (Signed)
S/w Jessica at [facility]. She stated the gel-tube was never received at [facility]. They checked with the lab and carrier and the tube was not found. Patient will need lab work redrawn.   S/w patient. She has difficulty with getting a ride and requests to go to Dr Jinny Blossom Johnson's office in [place].   [facility] Dr. Durenda Age office. Nurse said Dr Wynetta Emery would approve lab work so patient could have drawn there tomorrow. Patient may go in at anytime.  Patient notified that she can go anytime tomorrow to the office for labs. She verbalized understanding.

## 2017-07-08 NOTE — Telephone Encounter (Signed)
BMP drawn 2/28 was lost specimen patient will have to be redrawn

## 2017-07-09 ENCOUNTER — Other Ambulatory Visit: Payer: Medicare Other

## 2017-07-09 DIAGNOSIS — Z79899 Other long term (current) drug therapy: Secondary | ICD-10-CM

## 2017-07-09 DIAGNOSIS — I5033 Acute on chronic diastolic (congestive) heart failure: Secondary | ICD-10-CM

## 2017-07-10 LAB — BASIC METABOLIC PANEL
BUN/Creatinine Ratio: 20 (ref 12–28)
BUN: 18 mg/dL (ref 8–27)
CO2: 22 mmol/L (ref 20–29)
CREATININE: 0.92 mg/dL (ref 0.57–1.00)
Calcium: 8.1 mg/dL — ABNORMAL LOW (ref 8.7–10.3)
Chloride: 107 mmol/L — ABNORMAL HIGH (ref 96–106)
GFR calc Af Amer: 75 mL/min/{1.73_m2} (ref >59)
GFR, EST NON AFRICAN AMERICAN: 65 mL/min/{1.73_m2} (ref >59)
Glucose: 85 mg/dL (ref 65–99)
Potassium: 4.9 mmol/L (ref 3.5–5.2)
Sodium: 139 mmol/L (ref 134–144)

## 2017-07-22 ENCOUNTER — Other Ambulatory Visit: Payer: Self-pay | Admitting: Physician Assistant

## 2017-07-22 DIAGNOSIS — I779 Disorder of arteries and arterioles, unspecified: Secondary | ICD-10-CM

## 2017-07-31 ENCOUNTER — Other Ambulatory Visit: Payer: Self-pay | Admitting: Family Medicine

## 2017-07-31 NOTE — Telephone Encounter (Signed)
Vitamin D 50,000 iu (Drisdol) refill Last OV: 12/03/16 Last Refill:12/04/16 #12 caps 0 RF Pharmacy:[facility] [address] PCP: Park Liter DO

## 2017-08-12 ENCOUNTER — Other Ambulatory Visit: Payer: Self-pay | Admitting: Family Medicine

## 2017-08-12 MED ORDER — HYDROCODONE-ACETAMINOPHEN 10-325 MG PO TABS: 1.0000 | ORAL_TABLET | Freq: Four times a day (QID) | ORAL | 0 refills | Status: DC | PRN

## 2017-08-14 ENCOUNTER — Ambulatory Visit (INDEPENDENT_AMBULATORY_CARE_PROVIDER_SITE_OTHER): Payer: Medicare Other

## 2017-08-14 ENCOUNTER — Other Ambulatory Visit: Payer: Self-pay

## 2017-08-14 DIAGNOSIS — E7800 Pure hypercholesterolemia, unspecified: Secondary | ICD-10-CM

## 2017-08-14 DIAGNOSIS — I779 Disorder of arteries and arterioles, unspecified: Secondary | ICD-10-CM

## 2017-08-14 DIAGNOSIS — I739 Peripheral vascular disease, unspecified: Secondary | ICD-10-CM

## 2017-08-14 NOTE — Progress Notes (Unsigned)
lipi

## 2017-08-19 ENCOUNTER — Telehealth: Payer: Self-pay | Admitting: Cardiovascular Disease

## 2017-08-19 NOTE — Telephone Encounter (Signed)
If she has not been having any further palpitations, than I don't think there would be a reason to have her wear another monitor.  If however, she has continued to have palpitations, then yes, we should re-order.

## 2017-08-19 NOTE — Telephone Encounter (Signed)
Spoke with patient and she reports that she mailed the monitor back nearly a month ago. Received fax from company stating that the device was "lost" based on 34 days since activation. Advised patient that I would check with provider to see if they would like her to wear another one. She verbalized understanding with no further questions at this time.

## 2017-08-20 NOTE — Telephone Encounter (Signed)
Spoke with patient and reviewed her symptoms. She reports that the palpitations only lasted for 1-2 days and she has not had any since then. Instructed her to please call us back if these symptoms return or worsen and we will be happy to reorder monitor at that time. She was appreciative for the call and had no further questions at this time.

## 2017-08-29 ENCOUNTER — Other Ambulatory Visit: Payer: Self-pay | Admitting: *Deleted

## 2017-08-29 DIAGNOSIS — D509 Iron deficiency anemia, unspecified: Secondary | ICD-10-CM
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
## 2017-09-01 ENCOUNTER — Other Ambulatory Visit: Payer: Self-pay

## 2017-09-01 ENCOUNTER — Inpatient Hospital Stay: Payer: Medicare Other | Attending: Oncology

## 2017-09-01 ENCOUNTER — Inpatient Hospital Stay: Payer: Medicare Other

## 2017-09-01 ENCOUNTER — Encounter: Payer: Self-pay | Admitting: Oncology

## 2017-09-01 ENCOUNTER — Inpatient Hospital Stay (HOSPITAL_BASED_OUTPATIENT_CLINIC_OR_DEPARTMENT_OTHER): Payer: Medicare Other | Admitting: Oncology

## 2017-09-01 VITALS — BP 107/69 | HR 96 | Temp 98.2°F | Resp 18 | Wt 126.4 lb

## 2017-09-01 DIAGNOSIS — Z720 Tobacco use: Secondary | ICD-10-CM

## 2017-09-01 DIAGNOSIS — D509 Iron deficiency anemia, unspecified: Secondary | ICD-10-CM | POA: Insufficient documentation

## 2017-09-01 LAB — CBC WITH DIFFERENTIAL/PLATELET
Basophils Absolute: 0 10*3/uL (ref 0–0.1)
Basophils Relative: 0 %
Eosinophils Absolute: 0.4 10*3/uL (ref 0–0.7)
Eosinophils Relative: 6 %
HEMATOCRIT: 39 % (ref 35.0–47.0)
HEMOGLOBIN: 12.7 g/dL (ref 12.0–16.0)
LYMPHS ABS: 1.7 10*3/uL (ref 1.0–3.6)
Lymphocytes Relative: 22 %
MCH: 27.6 pg (ref 26.0–34.0)
MCHC: 32.4 g/dL (ref 32.0–36.0)
MCV: 85.3 fL (ref 80.0–100.0)
MONO ABS: 0.4 10*3/uL (ref 0.2–0.9)
MONOS PCT: 6 %
NEUTROS ABS: 4.9 10*3/uL (ref 1.4–6.5)
NEUTROS PCT: 66 %
Platelets: 357 10*3/uL (ref 150–440)
RBC: 4.58 MIL/uL (ref 3.80–5.20)
RDW: 19.2 % — ABNORMAL HIGH (ref 11.5–14.5)
WBC: 7.5 10*3/uL (ref 3.6–11.0)

## 2017-09-01 LAB — IRON AND TIBC
IRON: 23 ug/dL — AB (ref 28–170)
Saturation Ratios: 10 % — ABNORMAL LOW (ref 10.4–31.8)
TIBC: 234 ug/dL — ABNORMAL LOW (ref 250–450)
UIBC: 211 ug/dL

## 2017-09-01 LAB — FERRITIN: Ferritin: 5 ng/mL — ABNORMAL LOW (ref 11–307)

## 2017-09-01 NOTE — Progress Notes (Signed)
Patient here for follow up

## 2017-09-19 ENCOUNTER — Ambulatory Visit: Payer: Medicare Other | Admitting: Cardiovascular Disease

## 2017-09-19 ENCOUNTER — Encounter: Payer: Self-pay | Admitting: Cardiovascular Disease

## 2017-09-19 VITALS — BP 140/60 | HR 96 | Ht 65.0 in | Wt 127.8 lb

## 2017-09-19 DIAGNOSIS — I5022 Chronic systolic (congestive) heart failure: Secondary | ICD-10-CM

## 2017-09-19 DIAGNOSIS — I872 Venous insufficiency (chronic) (peripheral): Secondary | ICD-10-CM

## 2017-09-19 DIAGNOSIS — Z720 Tobacco use: Secondary | ICD-10-CM | POA: Diagnosis not present

## 2017-09-19 DIAGNOSIS — I739 Peripheral vascular disease, unspecified: Secondary | ICD-10-CM | POA: Diagnosis not present

## 2017-09-19 MED ORDER — TORSEMIDE 20 MG PO TABS: 20.0000 mg | ORAL_TABLET | Freq: Every day | ORAL | 3 refills | Status: DC

## 2017-09-19 NOTE — Patient Instructions (Addendum)
Medication Instructions:  Your physician has recommended you make the following change in your medication:  1. START Demadex (Torsemide) 20 mg Once daily    Dr. Fletcher Anon wants you to do the following:   1. Wear knee high support hose during the day and take off at bedtime. 2. Elevate legs three times daily for at least 20 minutes.   Follow-Up: Your physician wants you to follow-up in: 6 months with Dr. Fletcher Anon. You will receive a reminder letter in the mail two months in advance. If you don't receive a letter, please call our office to schedule the follow-up appointment.  It was a pleasure seeing you today here in the office. Please do not hesitate to give [place] a call back if you have any further questions. [place], BSN
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
## 2017-09-23 ENCOUNTER — Telehealth: Payer: Self-pay | Admitting: Family Medicine

## 2017-09-23 NOTE — Telephone Encounter (Signed)
28 day cycle changing with e-rx. Last seen in follow up 04/08/18. Due 07/07/17. Has not been seen. Last refill 08/25/17. Due 09/22/17. Will need appointment scheduled to get refills. Please schedule ASAP. Thanks!

## 2017-09-24 ENCOUNTER — Other Ambulatory Visit: Payer: Self-pay | Admitting: Physician Assistant

## 2017-09-24 DIAGNOSIS — I739 Peripheral vascular disease, unspecified: Secondary | ICD-10-CM

## 2017-10-03 ENCOUNTER — Telehealth: Payer: Self-pay | Admitting: *Deleted

## 2017-10-03 NOTE — Telephone Encounter (Signed)
Pt requiring PA for Torsemide 20 mg tablet (1 tablet qd.) PA has been submitted awaiting approval. Key: [id number] - Rx #: [id number]

## 2017-10-09 ENCOUNTER — Encounter: Payer: Self-pay | Admitting: *Deleted

## 2017-10-09 ENCOUNTER — Encounter: Payer: Self-pay | Admitting: Family Medicine

## 2017-10-09 ENCOUNTER — Ambulatory Visit (INDEPENDENT_AMBULATORY_CARE_PROVIDER_SITE_OTHER): Payer: Medicare Other | Admitting: Family Medicine

## 2017-10-09 ENCOUNTER — Encounter: Payer: Self-pay | Admitting: Cardiovascular Disease

## 2017-10-09 VITALS — BP 121/73 | HR 100 | Temp 98.2°F | Wt 124.0 lb

## 2017-10-09 DIAGNOSIS — I10 Essential (primary) hypertension: Secondary | ICD-10-CM

## 2017-10-09 DIAGNOSIS — I739 Peripheral vascular disease, unspecified: Secondary | ICD-10-CM | POA: Diagnosis not present

## 2017-10-09 DIAGNOSIS — R300 Dysuria: Secondary | ICD-10-CM

## 2017-10-09 DIAGNOSIS — E039 Hypothyroidism, unspecified: Secondary | ICD-10-CM

## 2017-10-09 DIAGNOSIS — D509 Iron deficiency anemia, unspecified: Secondary | ICD-10-CM | POA: Diagnosis not present

## 2017-10-09 DIAGNOSIS — E782 Mixed hyperlipidemia: Secondary | ICD-10-CM

## 2017-10-09 DIAGNOSIS — I5033 Acute on chronic diastolic (congestive) heart failure: Secondary | ICD-10-CM | POA: Diagnosis not present

## 2017-10-09 DIAGNOSIS — G2581 Restless legs syndrome: Secondary | ICD-10-CM | POA: Diagnosis not present

## 2017-10-09 DIAGNOSIS — N179 Acute kidney failure, unspecified: Secondary | ICD-10-CM

## 2017-10-09 DIAGNOSIS — I6523 Occlusion and stenosis of bilateral carotid arteries: Secondary | ICD-10-CM | POA: Diagnosis not present

## 2017-10-09 DIAGNOSIS — I2510 Atherosclerotic heart disease of native coronary artery without angina pectoris: Secondary | ICD-10-CM

## 2017-10-09 DIAGNOSIS — M5442 Lumbago with sciatica, left side: Secondary | ICD-10-CM

## 2017-10-09 DIAGNOSIS — K279 Peptic ulcer, site unspecified, unspecified as acute or chronic, without hemorrhage or perforation: Secondary | ICD-10-CM | POA: Diagnosis not present

## 2017-10-09 DIAGNOSIS — E559 Vitamin D deficiency, unspecified: Secondary | ICD-10-CM

## 2017-10-09 DIAGNOSIS — G8929 Other chronic pain: Secondary | ICD-10-CM

## 2017-10-09 DIAGNOSIS — Z66 Do not resuscitate: Secondary | ICD-10-CM

## 2017-10-09 MED ORDER — OMEPRAZOLE 20 MG PO CPDR: 20.0000 mg | DELAYED_RELEASE_CAPSULE | Freq: Every day | ORAL | 3 refills | Status: DC

## 2017-10-09 MED ORDER — VITAMIN D (ERGOCALCIFEROL) 1.25 MG (50000 UNIT) PO CAPS: 50000.0000 [IU] | ORAL_CAPSULE | ORAL | 0 refills | Status: DC

## 2017-10-09 MED ORDER — HYDROCODONE-ACETAMINOPHEN 10-325 MG PO TABS: 1.0000 | ORAL_TABLET | Freq: Four times a day (QID) | ORAL | 0 refills | Status: DC | PRN

## 2017-10-09 MED ORDER — HYDROCODONE-ACETAMINOPHEN 10-325 MG PO TABS
1.0000 | ORAL_TABLET | Freq: Four times a day (QID) | ORAL | 0 refills | Status: DC | PRN
Start: 2017-11-06 — End: 2017-12-11

## 2017-10-09 MED ORDER — DOCUSATE SODIUM 100 MG PO CAPS: 100.0000 mg | ORAL_CAPSULE | ORAL | 1 refills | Status: DC | PRN

## 2017-10-09 MED ORDER — DULOXETINE HCL 20 MG PO CPEP
20.0000 mg | ORAL_CAPSULE | Freq: Every day | ORAL | 3 refills | Status: DC
Start: 2017-10-09 — End: 2017-12-11

## 2017-10-09 NOTE — Telephone Encounter (Signed)
This encounter was created in error - please disregard.

## 2017-10-09 NOTE — Assessment & Plan Note (Signed)
Rechecking levels today. Await results. Call with any concerns.  

## 2017-10-09 NOTE — Assessment & Plan Note (Signed)
Rechecking levels today. Await results. Treat as needed. Call with any concerns.  

## 2017-10-09 NOTE — Assessment & Plan Note (Signed)
Stable on current regimen. Continue current regimen. Continue to monitor. 3 month supply set. Follow up in 3 months. Call with any concerns.

## 2017-10-09 NOTE — Assessment & Plan Note (Signed)
Under good control. Continue current regimen. Call with any concerns. Continue to monitor.

## 2017-10-09 NOTE — Assessment & Plan Note (Signed)
Discussion today with patient about her wishes. She would like to be a DNR. Form filled out today. Scanned in. She was given originals.

## 2017-10-09 NOTE — Progress Notes (Signed)
BP 121/73 (BP Location: Left Arm, Patient Position: Sitting, Cuff Size: Small)   Pulse 100   Temp 98.2 F (36.8 C)   Wt 124 lb (56.2 kg)   SpO2 96%   BMI 20.63 kg/m    Subjective:    Patient ID: [name], female    DOB: [birth year], 67 y.o.   MRN: [id number]  HPI: [name] is a 67 y.o. female  Chief Complaint  Patient presents with  . Pain  . Hypothyroidism  . Hypertension  . Hyperlipidemia  . Urinary Tract Infection   CHRONIC PAIN  Present dose:120  Morphine equivalents Pain control status: controlled Duration: chronic Location: low back and hip Quality: dull and aching Current Pain Level: moderate Previous Pain Level: severe Breakthrough pain: no Benefit from narcotic medications: yes What Activities task can be accomplished with current medication? Able to care for herself Interested in weaning off narcotics:no   Stool softners/OTC fiber: yes  Previous pain specialty evaluation: no Non-narcotic analgesic meds: yes Narcotic contract: yes  HYPERTENSION / HYPERLIPIDEMIA Satisfied with current treatment? yes Duration of hypertension: chronic BP monitoring frequency: not checking BP medication side effects: yes Past BP meds: torsemide Duration of hyperlipidemia: chronic Cholesterol medication side effects: Not on anything Cholesterol supplements: none Medication compliance: excellent compliance Aspirin: no Recent stressors: no Recurrent headaches: no Visual changes: no Palpitations: no Dyspnea: no Chest pain: no Lower extremity edema: no Dizzy/lightheaded: no  HYPOTHYROIDISM Thyroid control status:stable Satisfied with current treatment? yes Medication side effects: no Medication compliance: excellent compliance Recent dose adjustment:no Fatigue: yes Cold intolerance: no Heat intolerance: no Weight gain: no Weight loss: no Constipation: no Diarrhea/loose stools: no Palpitations: no Lower extremity edema: yes Anxiety/depressed  mood: yes  URINARY SYMPTOMS Duration: Past couple of days Dysuria: yes Urinary frequency: yes Urgency: yes Small volume voids: yes Symptom severity: mild Urinary incontinence: no Foul odor: no Hematuria: no Abdominal pain: no Back pain: yes Suprapubic pain/pressure: no Flank pain: nopain Fever:  no Vomiting: no Relief with cranberry juice: no Relief with pyridium: no Status:worse Previous urinary tract infection: yes Recurrent urinary tract infection: no Treatments attempted: cranberry and increasing fluids    Relevant past medical, surgical, family and social history reviewed and updated as indicated. Interim medical history since our last visit reviewed. Allergies and medications reviewed and updated.  Review of Systems  Constitutional: Positive for fatigue. Negative for activity change, appetite change, chills, diaphoresis, fever and unexpected weight change.  HENT: Negative.   Respiratory: Positive for cough, chest tightness and shortness of breath. Negative for apnea, choking, wheezing and stridor.   Cardiovascular: Positive for leg swelling. Negative for chest pain and palpitations.  Gastrointestinal: Negative.   Musculoskeletal: Negative.   Skin: Negative.   Psychiatric/Behavioral: Positive for dysphoric mood. Negative for agitation, behavioral problems, confusion, decreased concentration, hallucinations, self-injury, sleep disturbance and suicidal ideas. The patient is nervous/anxious. The patient is not hyperactive.     Per HPI unless specifically indicated above     Objective:    BP 121/73 (BP Location: Left Arm, Patient Position: Sitting, Cuff Size: Small)   Pulse 100   Temp 98.2 F (36.8 C)   Wt 124 lb (56.2 kg)   SpO2 96%   BMI 20.63 kg/m   Wt Readings from Last 3 Encounters:  10/09/17 124 lb (56.2 kg)  09/19/17 127 lb 12 oz (57.9 kg)  09/01/17 126 lb 6.4 oz (57.3 kg)    Physical Exam  Constitutional: She is oriented to person, place, and time.  She appears well-developed and well-nourished. No distress.  HENT:  Head: Normocephalic and atraumatic.  Right Ear: Hearing normal.  Left Ear: Hearing normal.  Nose: Nose normal.  Eyes: Conjunctivae and lids are normal. Right eye exhibits no discharge. Left eye exhibits no discharge. No scleral icterus.  Cardiovascular: Normal rate and intact distal pulses. Exam reveals no gallop and no friction rub.  Murmur heard. Pulmonary/Chest: Effort normal and breath sounds normal. No stridor. No respiratory distress. She has no wheezes. She has no rales. She exhibits no tenderness.  Musculoskeletal: Normal range of motion. She exhibits edema.  Neurological: She is alert and oriented to person, place, and time.  Skin: Skin is warm, dry and intact. Capillary refill takes less than 2 seconds. No rash noted. She is not diaphoretic. No erythema. No pallor.  Psychiatric: She has a normal mood and affect. Her speech is normal and behavior is normal. Judgment and thought content normal. Cognition and memory are normal.  Nursing note and vitals reviewed.   Results for orders placed or performed in visit on 09/01/17  Iron and TIBC  Result Value Ref Range   Iron 23 (L) 28 - 170 ug/dL   TIBC 234 (L) 250 - 450 ug/dL   Saturation Ratios 10 (L) 10.4 - 31.8 %   UIBC 211 ug/dL  Ferritin  Result Value Ref Range   Ferritin 5 (L) 11 - 307 ng/mL  CBC with Differential/Platelet  Result Value Ref Range   WBC 7.5 3.6 - 11.0 K/uL   RBC 4.58 3.80 - 5.20 MIL/uL   Hemoglobin 12.7 12.0 - 16.0 g/dL   HCT 39.0 35.0 - 47.0 %   MCV 85.3 80.0 - 100.0 fL   MCH 27.6 26.0 - 34.0 pg   MCHC 32.4 32.0 - 36.0 g/dL   RDW 19.2 (H) 11.5 - 14.5 %   Platelets 357 150 - 440 K/uL   Neutrophils Relative % 66 %   Neutro Abs 4.9 1.4 - 6.5 K/uL   Lymphocytes Relative 22 %   Lymphs Abs 1.7 1.0 - 3.6 K/uL   Monocytes Relative 6 %   Monocytes Absolute 0.4 0.2 - 0.9 K/uL   Eosinophils Relative 6 %   Eosinophils Absolute 0.4 0 - 0.7 K/uL    Basophils Relative 0 %   Basophils Absolute 0.0 0 - 0.1 K/uL      Assessment & Plan:   Problem List Items Addressed This Visit      Cardiovascular and Mediastinum   Coronary artery disease, non-occlusive (Chronic)    Continue to follow with vascular. Call with any concerns. Continue to monitor. Continue current regimen.       Relevant Orders   CBC with Differential/Platelet   Comprehensive metabolic panel   UA/M w/rflx Culture, Routine   Acute on chronic diastolic CHF (congestive heart failure) ([place])    Continue to follow with cardiology. Continue to monitor. Call with any concerns. Stable.       Relevant Orders   CBC with Differential/Platelet   Comprehensive metabolic panel   UA/M w/rflx Culture, Routine   HTN (hypertension) - Primary    Under good control. Continue current regimen. Call with any concerns. Continue to monitor.       Relevant Orders   CBC with Differential/Platelet   Comprehensive metabolic panel   Microalbumin, Urine Waived   UA/M w/rflx Culture, Routine   PAD (peripheral artery disease) ([place])    Continue to follow with vascular. Call with any concerns. Continue to monitor. Continue current regimen.  Relevant Orders   CBC with Differential/Platelet   Comprehensive metabolic panel   UA/M w/rflx Culture, Routine   Carotid stenosis    Continue to follow with vascular. Call with any concerns. Continue to monitor. Continue current regimen.       Relevant Orders   CBC with Differential/Platelet   Comprehensive metabolic panel   UA/M w/rflx Culture, Routine     Digestive   PUD (peptic ulcer disease)    Stable. No pain. Continue to monitor. Call with any concerns. Refills given.       Relevant Orders   CBC with Differential/Platelet   Comprehensive metabolic panel   UA/M w/rflx Culture, Routine     Endocrine   Hypothyroidism    Rechecking levels today. Await results. Treat as needed. Call with any concerns.       Relevant Orders    CBC with Differential/Platelet   Comprehensive metabolic panel   TSH   UA/M w/rflx Culture, Routine     Genitourinary   ARF (acute renal failure) (HCC)    Rechecking levels today. Await results. Call with any concerns.       Relevant Orders   CBC with Differential/Platelet   Comprehensive metabolic panel   UA/M w/rflx Culture, Routine     Other   Hyperlipidemia    Rechecking levels today. Await results. Call with any concerns.       Relevant Orders   CBC with Differential/Platelet   Comprehensive metabolic panel   Lipid Panel w/o Chol/HDL Ratio   UA/M w/rflx Culture, Routine   Chronic back pain    Stable on current regimen. Continue current regimen. Continue to monitor. 3 month supply set. Follow up in 3 months. Call with any concerns.       Relevant Medications   HYDROcodone-acetaminophen (NORCO) 10-325 MG tablet   HYDROcodone-acetaminophen (NORCO) 10-325 MG tablet (Start on 11/06/2017)   HYDROcodone-acetaminophen (NORCO) 10-325 MG tablet (Start on 12/04/2017)   Other Relevant Orders   CBC with Differential/Platelet   Comprehensive metabolic panel   UA/M w/rflx Culture, Routine   Claudication ([place])    Continue to follow with vascular. Call with any concerns. Continue to monitor. Continue current regimen.       Relevant Orders   CBC with Differential/Platelet   Comprehensive metabolic panel   UA/M w/rflx Culture, Routine   Iron deficiency anemia    Rechecking levels. Await results. Continue to follow with hematology. Call with any concerns.       Relevant Orders   CBC with Differential/Platelet   Comprehensive metabolic panel   UA/M w/rflx Culture, Routine   Ferritin   RLS (restless legs syndrome)    Rechecking levels. Await results. Continue to follow with hematology. Call with any concerns.       Relevant Orders   CBC with Differential/Platelet   Comprehensive metabolic panel   UA/M w/rflx Culture, Routine   Ferritin   Vitamin D deficiency    Rechecking  levels. Await results. Treat as needed. Call with any concerns.       Relevant Orders   CBC with Differential/Platelet   Comprehensive metabolic panel   UA/M w/rflx Culture, Routine   VITAMIN D 25 Hydroxy (Vit-D Deficiency, Fractures)   DNR (do not resuscitate)    Discussion today with patient about her wishes. She would like to be a DNR. Form filled out today. Scanned in. She was given originals.        Other Visit Diagnoses    Dysuria  Will check urine. Await results.    Relevant Orders   Urine Culture       Follow up plan: Return in about 3 months (around 01/09/2018).

## 2017-10-09 NOTE — Assessment & Plan Note (Signed)
Stable. No pain. Continue to monitor. Call with any concerns. Refills given.

## 2017-10-09 NOTE — Assessment & Plan Note (Signed)
Continue to follow with vascular. Call with any concerns. Continue to monitor. Continue current regimen.

## 2017-10-09 NOTE — Assessment & Plan Note (Signed)
Continue to follow with cardiology. Continue to monitor. Call with any concerns. Stable.

## 2017-10-09 NOTE — Assessment & Plan Note (Signed)
Rechecking levels. Await results. Continue to follow with hematology. Call with any concerns.

## 2017-10-09 NOTE — Telephone Encounter (Signed)
Spoke with [employer] rep. [phone] Pt was denied for Torsemide due to medication not on accepted Formulary. Pt is to have tried and failed two preferred medications. Appeal needs to be faxed to [phone] Include:  Contact Information Clinical Information Statement w/Member ID/DOB: [id number]

## 2017-10-09 NOTE — Assessment & Plan Note (Signed)
Rechecking levels. Await results. Treat as needed. Call with any concerns.

## 2017-10-10 ENCOUNTER — Telehealth: Payer: Self-pay | Admitting: Cardiovascular Disease

## 2017-10-10 ENCOUNTER — Other Ambulatory Visit: Payer: Self-pay | Admitting: Family Medicine

## 2017-10-10 LAB — UA/M W/RFLX CULTURE, ROUTINE
BILIRUBIN UA: NEGATIVE
GLUCOSE, UA: NEGATIVE
Ketones, UA: NEGATIVE
LEUKOCYTES UA: NEGATIVE
Nitrite, UA: NEGATIVE
PH UA: 5.5 (ref 5.0–7.5)
RBC, UA: NEGATIVE
Specific Gravity, UA: 1.025 (ref 1.005–1.030)
Urobilinogen, Ur: 1 mg/dL (ref 0.2–1.0)

## 2017-10-10 LAB — COMPREHENSIVE METABOLIC PANEL
A/G RATIO: 1.3 (ref 1.2–2.2)
ALT: 6 IU/L (ref 0–32)
AST: 13 IU/L (ref 0–40)
Albumin: 2.9 g/dL — ABNORMAL LOW (ref 3.6–4.8)
Alkaline Phosphatase: 140 IU/L — ABNORMAL HIGH (ref 39–117)
BUN / CREAT RATIO: 13 (ref 12–28)
BUN: 12 mg/dL (ref 8–27)
CALCIUM: 8.1 mg/dL — AB (ref 8.7–10.3)
CHLORIDE: 107 mmol/L — AB (ref 96–106)
CO2: 24 mmol/L (ref 20–29)
Creatinine, Ser: 0.95 mg/dL (ref 0.57–1.00)
GFR calc Af Amer: 72 mL/min/{1.73_m2} (ref >59)
GFR, EST NON AFRICAN AMERICAN: 62 mL/min/{1.73_m2} (ref >59)
Globulin, Total: 2.3 g/dL (ref 1.5–4.5)
Glucose: 111 mg/dL — ABNORMAL HIGH (ref 65–99)
POTASSIUM: 4.3 mmol/L (ref 3.5–5.2)
Sodium: 147 mmol/L — ABNORMAL HIGH (ref 134–144)
TOTAL PROTEIN: 5.2 g/dL — AB (ref 6.0–8.5)

## 2017-10-10 LAB — CBC WITH DIFFERENTIAL/PLATELET
BASOS: 0 %
Basophils Absolute: 0 10*3/uL (ref 0.0–0.2)
EOS (ABSOLUTE): 0.3 10*3/uL (ref 0.0–0.4)
Eos: 4 %
HEMOGLOBIN: 12.9 g/dL (ref 11.1–15.9)
Hematocrit: 42.1 % (ref 34.0–46.6)
IMMATURE GRANS (ABS): 0.1 10*3/uL (ref 0.0–0.1)
Immature Granulocytes: 1 %
LYMPHS: 21 %
Lymphocytes Absolute: 1.8 10*3/uL (ref 0.7–3.1)
MCH: 26.7 pg (ref 26.6–33.0)
MCHC: 30.6 g/dL — ABNORMAL LOW (ref 31.5–35.7)
MCV: 87 fL (ref 79–97)
MONOCYTES: 5 %
Monocytes Absolute: 0.4 10*3/uL (ref 0.1–0.9)
NEUTROS ABS: 5.9 10*3/uL (ref 1.4–7.0)
Neutrophils: 69 %
Platelets: 506 10*3/uL — ABNORMAL HIGH (ref 150–450)
RBC: 4.83 x10E6/uL (ref 3.77–5.28)
RDW: 21.4 % — ABNORMAL HIGH (ref 12.3–15.4)
WBC: 8.6 10*3/uL (ref 3.4–10.8)

## 2017-10-10 LAB — LIPID PANEL W/O CHOL/HDL RATIO
Cholesterol, Total: 146 mg/dL (ref 100–199)
HDL: 38 mg/dL — ABNORMAL LOW (ref >39)
LDL Calculated: 57 mg/dL (ref 0–99)
Triglycerides: 256 mg/dL — ABNORMAL HIGH (ref 0–149)
VLDL CHOLESTEROL CAL: 51 mg/dL — AB (ref 5–40)

## 2017-10-10 LAB — TSH: TSH: 1.89 u[IU]/mL (ref 0.450–4.500)

## 2017-10-10 LAB — VITAMIN D 25 HYDROXY (VIT D DEFICIENCY, FRACTURES): VIT D 25 HYDROXY: 16.6 ng/mL — AB (ref 30.0–100.0)

## 2017-10-10 LAB — MICROALBUMIN, URINE WAIVED
Creatinine, Urine Waived: 200 mg/dL (ref 10–300)
MICROALB, UR WAIVED: 80 mg/L — AB (ref 0–19)

## 2017-10-10 LAB — FERRITIN: Ferritin: 22 ng/mL (ref 15–150)

## 2017-10-10 MED ORDER — VITAMIN D (ERGOCALCIFEROL) 1.25 MG (50000 UNIT) PO CAPS: 50000.0000 [IU] | ORAL_CAPSULE | ORAL | 4 refills | Status: DC

## 2017-10-10 NOTE — Telephone Encounter (Signed)
We can switch to bumetanide 1 mg daily.

## 2017-10-10 NOTE — Telephone Encounter (Signed)
[employer] called stating that the patient needs to try two diuretics other than torsemide and fail at them before she can start the torsemide. She has already been on furosemide. They would like to know if she can try bumetanide and if she cannot what the reason is for this.

## 2017-10-10 NOTE — Telephone Encounter (Signed)
[facility] calling about torsemide and wants to be sure  bumetanide has been tried and failed if not can she try this or advise why this is not appropriate for treatment.  Please call [facility] [phone]

## 2017-10-13 MED ORDER — BUMETANIDE 1 MG PO TABS: 1.0000 mg | ORAL_TABLET | Freq: Every day | ORAL | 6 refills | Status: DC

## 2017-10-13 NOTE — Telephone Encounter (Signed)
[facility] has been called and made aware that the patient will start Bumetanide 1 mg daily.  The patient has been called and made aware to discontinue the Torsemide and start Bumetanide 1 mg daily. It has been sent into her pharmacy. She will call back if anything further is needed.

## 2017-10-13 NOTE — Telephone Encounter (Signed)
Left a message to call back.

## 2017-10-14 NOTE — Telephone Encounter (Signed)
Wellington Hampshire, MD  to Madeline Barker, RN     6:18 PM  Note    We can switch to bumetanide 1 mg daily.       5:39 PM  Madeline Barker, RN routed this conversation to Wellington Hampshire, MD   Madeline Barker, RN     5:34 PM  Note    [facility] called stating that the patient needs to try two diuretics other than torsemide and fail at them before she can start the torsemide. She has already been on furosemide. They would like to know if she can try bumetanide and if she cannot what the reason is for this.

## 2017-11-05 ENCOUNTER — Other Ambulatory Visit: Payer: Self-pay | Admitting: Family Medicine
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
## 2017-12-09 ENCOUNTER — Other Ambulatory Visit: Payer: Self-pay | Admitting: *Deleted

## 2017-12-09 DIAGNOSIS — D509 Iron deficiency anemia, unspecified: Secondary | ICD-10-CM

## 2017-12-09 NOTE — Progress Notes (Signed)
c 

## 2017-12-11 ENCOUNTER — Other Ambulatory Visit: Payer: Self-pay

## 2017-12-11 ENCOUNTER — Inpatient Hospital Stay: Payer: Medicare Other

## 2017-12-11 ENCOUNTER — Inpatient Hospital Stay (HOSPITAL_BASED_OUTPATIENT_CLINIC_OR_DEPARTMENT_OTHER): Payer: Medicare Other | Admitting: Oncology

## 2017-12-11 ENCOUNTER — Encounter: Payer: Self-pay | Admitting: Oncology

## 2017-12-11 ENCOUNTER — Inpatient Hospital Stay: Payer: Medicare Other | Attending: Oncology

## 2017-12-11 VITALS — BP 100/64 | HR 89 | Temp 97.9°F | Resp 20 | Ht 65.0 in | Wt 119.6 lb

## 2017-12-11 DIAGNOSIS — R5382 Chronic fatigue, unspecified: Secondary | ICD-10-CM | POA: Insufficient documentation

## 2017-12-11 DIAGNOSIS — D500 Iron deficiency anemia secondary to blood loss (chronic): Secondary | ICD-10-CM

## 2017-12-11 DIAGNOSIS — D509 Iron deficiency anemia, unspecified: Secondary | ICD-10-CM | POA: Diagnosis not present

## 2017-12-11 DIAGNOSIS — Z720 Tobacco use: Secondary | ICD-10-CM

## 2017-12-11 DIAGNOSIS — R531 Weakness: Secondary | ICD-10-CM

## 2017-12-11 LAB — CBC WITH DIFFERENTIAL/PLATELET
BASOS ABS: 0.1 10*3/uL (ref 0–0.1)
Basophils Relative: 1 %
EOS ABS: 0.1 10*3/uL (ref 0–0.7)
EOS PCT: 2 %
HCT: 36.3 % (ref 35.0–47.0)
Hemoglobin: 11.8 g/dL — ABNORMAL LOW (ref 12.0–16.0)
LYMPHS ABS: 1.6 10*3/uL (ref 1.0–3.6)
Lymphocytes Relative: 25 %
MCH: 29.4 pg (ref 26.0–34.0)
MCHC: 32.5 g/dL (ref 32.0–36.0)
MCV: 90.3 fL (ref 80.0–100.0)
Monocytes Absolute: 0.4 10*3/uL (ref 0.2–0.9)
Monocytes Relative: 6 %
Neutro Abs: 4.2 10*3/uL (ref 1.4–6.5)
Neutrophils Relative %: 66 %
PLATELETS: 384 10*3/uL (ref 150–440)
RBC: 4.02 MIL/uL (ref 3.80–5.20)
RDW: 17.8 % — ABNORMAL HIGH (ref 11.5–14.5)
WBC: 6.3 10*3/uL (ref 3.6–11.0)

## 2017-12-11 LAB — IRON AND TIBC
Iron: 26 ug/dL — ABNORMAL LOW (ref 28–170)
SATURATION RATIOS: 12 % (ref 10.4–31.8)
TIBC: 213 ug/dL — AB (ref 250–450)
UIBC: 187 ug/dL

## 2017-12-11 LAB — FERRITIN: FERRITIN: 4 ng/mL — AB (ref 11–307)

## 2017-12-11 MED ORDER — SODIUM CHLORIDE 0.9 % IV SOLN
INTRAVENOUS | Status: DC
  Administered 2017-12-11: 16:00:00 via INTRAVENOUS
  Filled 2017-12-11: qty 1000

## 2017-12-11 MED ORDER — FERUMOXYTOL INJECTION 510 MG/17 ML
510.0000 mg | Freq: Once | INTRAVENOUS | Status: AC
  Administered 2017-12-11: 510 mg via INTRAVENOUS
  Filled 2017-12-11: qty 17

## 2017-12-11 NOTE — Progress Notes (Signed)
Patient states that her pcp has spoken to her about hospice services. Patient states that she is hesitant to enroll in hospice.  Active listening provided to patient. Pt believed that she "would receive continuous morphine and would be sedated at all times."  I explained to her that hospice care goals is to keep patient comfortable. Although some morphine may be used to control pain, the goals of care are not to "keep her sedated at all times." She understands that morphine may be utilized to control pain medication for some situations. However, she would continue with her routine medications until such a time is needed (to use morphine). She stated that this conversation was reassuring. She will discuss the option with hospice with her pcp next week.

## 2017-12-18 ENCOUNTER — Telehealth: Payer: Self-pay

## 2017-12-18 DIAGNOSIS — N179 Acute kidney failure, unspecified: Secondary | ICD-10-CM

## 2017-12-18 DIAGNOSIS — K279 Peptic ulcer, site unspecified, unspecified as acute or chronic, without hemorrhage or perforation: Secondary | ICD-10-CM

## 2017-12-18 DIAGNOSIS — I2510 Atherosclerotic heart disease of native coronary artery without angina pectoris: Secondary | ICD-10-CM

## 2017-12-18 DIAGNOSIS — I5033 Acute on chronic diastolic (congestive) heart failure: Secondary | ICD-10-CM

## 2017-12-18 NOTE — Telephone Encounter (Deleted)
Left confidential voicemail for [name] with [facility] regarding referral.  MRN given for [name] to pull referral.  Told her to call us for any questions and to let us know she got the referral.

## 2017-12-18 NOTE — Telephone Encounter (Signed)
Yes, please refer

## 2017-12-18 NOTE — Addendum Note (Signed)
Addended by: Sandria Manly on: 12/18/2017 03:32 PM   Modules accepted: Orders

## 2017-12-18 NOTE — Telephone Encounter (Signed)
Last office visit 10/09/2017.  Ok to put in referral for Hospice with [facility]?  Hospice with [facility]  Phone: [phone] Fax: [phone]

## 2017-12-18 NOTE — Telephone Encounter (Signed)
Copied from [place] [id number]. Topic: Referral - Request >> Dec 18, 2017  1:01 PM Sheran Luz wrote: Reason for CRM: Sonya from [facility] called stating that the pt is ready to transfer into hospice and was unsure if her PCP should be the one to send the referral. Davy Pique stated that the pt prefers [facility]. Please advise.   BB#[phone]

## 2017-12-18 NOTE — Telephone Encounter (Addendum)
Referral entered Left confidential voicemail for Tresea Mall with [facility] regarding hospice MRN given for [name] to pull referral.  Told her to call us for any questions and to let us know she got the order through [place]. Form also filled out and to be signed.

## 2018-01-15 ENCOUNTER — Ambulatory Visit: Payer: Medicare Other | Admitting: Family Medicine

## 2018-01-29 ENCOUNTER — Telehealth: Payer: Self-pay | Admitting: Cardiovascular Disease

## 2018-01-29 NOTE — Telephone Encounter (Signed)
[name] from [facility] stated that he will be faxing a medical necessity form to be signed.

## 2018-01-29 NOTE — Telephone Encounter (Signed)
° ° °  Linton Rump from [facility] [phone], fax [phone] Requesting call regarding pneumatic compression device form.

## 2018-01-29 NOTE — Telephone Encounter (Signed)
[name] from [facility] stated that the patient was trying to get the compression pump herself.   The forms have been signed and faxed back.

## 2018-01-30 NOTE — Telephone Encounter (Signed)
Follow Up:    [name] from [facility] is requesting a call back to make corrections to the form that faxed.  Contact: [phone]

## 2018-02-02 NOTE — Telephone Encounter (Signed)
[name] from [employer] called and stated that he needed the forms re-faxed.

## 2018-02-02 NOTE — Telephone Encounter (Signed)
Call returned. There was no answer.

## 2018-02-05 NOTE — Telephone Encounter (Signed)
[name] from [place] called back stating that he wanted the forms he sent to be checked "yes" to the chronic insufficieny with ulcers. [name] stated that the "venous ulcers" could be crossed off. Dr. Fletcher Anon had signed "no" since the patient does not have ulcers.  [name] has been made aware that we will not cross of the "venous ulcers" on the forms and that they will have to be accepted as they were origianllly signed.
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
## 2018-03-16 ENCOUNTER — Inpatient Hospital Stay: Payer: Medicare Other

## 2018-03-16 ENCOUNTER — Inpatient Hospital Stay: Payer: Medicare Other | Admitting: Oncology

## 2019-01-26 ENCOUNTER — Encounter: Payer: Self-pay | Admitting: Family Medicine

## 2019-01-26 ENCOUNTER — Other Ambulatory Visit: Payer: Self-pay | Admitting: Family Medicine

## 2019-01-26 DIAGNOSIS — Z515 Encounter for palliative care: Secondary | ICD-10-CM | POA: Insufficient documentation

## 2019-02-03 ENCOUNTER — Telehealth: Payer: Self-pay

## 2019-02-03 NOTE — Telephone Encounter (Signed)
Copied from [place] [phone]. Topic: General - Other >> Feb 03, 2019  3:34 PM Keene Breath wrote: Reason for CRM: Randall Hiss with Hospice called to give an update on patient.  Patient has been d/c from hospice with extended prognosis.  No decline noted.  Please call to discuss further.  CB# [phone]

## 2019-02-05 ENCOUNTER — Ambulatory Visit (INDEPENDENT_AMBULATORY_CARE_PROVIDER_SITE_OTHER): Payer: Medicare Other | Admitting: Family Medicine

## 2019-02-05 ENCOUNTER — Other Ambulatory Visit: Payer: Self-pay

## 2019-02-05 ENCOUNTER — Encounter: Payer: Self-pay | Admitting: Family Medicine

## 2019-02-05 VITALS — BP 155/76 | HR 81 | Temp 98.2°F

## 2019-02-05 DIAGNOSIS — I10 Essential (primary) hypertension: Secondary | ICD-10-CM

## 2019-02-05 DIAGNOSIS — D509 Iron deficiency anemia, unspecified: Secondary | ICD-10-CM

## 2019-02-05 DIAGNOSIS — E559 Vitamin D deficiency, unspecified: Secondary | ICD-10-CM

## 2019-02-05 DIAGNOSIS — E782 Mixed hyperlipidemia: Secondary | ICD-10-CM

## 2019-02-05 DIAGNOSIS — R34 Anuria and oliguria: Secondary | ICD-10-CM

## 2019-02-05 DIAGNOSIS — I2510 Atherosclerotic heart disease of native coronary artery without angina pectoris: Secondary | ICD-10-CM

## 2019-02-05 DIAGNOSIS — I5033 Acute on chronic diastolic (congestive) heart failure: Secondary | ICD-10-CM

## 2019-02-05 DIAGNOSIS — I890 Lymphedema, not elsewhere classified: Secondary | ICD-10-CM

## 2019-02-05 DIAGNOSIS — E039 Hypothyroidism, unspecified: Secondary | ICD-10-CM | POA: Diagnosis not present

## 2019-02-05 DIAGNOSIS — M4800 Spinal stenosis, site unspecified: Secondary | ICD-10-CM

## 2019-02-05 LAB — BAYER DCA HB A1C WAIVED: HB A1C (BAYER DCA - WAIVED): 4.9 % (ref <7.0)

## 2019-02-05 MED ORDER — OXYCODONE HCL 30 MG PO TABS: 30.0000 mg | ORAL_TABLET | ORAL | 0 refills | Status: DC | PRN
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
## 2019-02-06 LAB — LIPID PANEL W/O CHOL/HDL RATIO
Cholesterol, Total: 149 mg/dL (ref 100–199)
HDL: 51 mg/dL (ref >39)
LDL Chol Calc (NIH): 80 mg/dL (ref 0–99)
Triglycerides: 97 mg/dL (ref 0–149)
VLDL Cholesterol Cal: 18 mg/dL (ref 5–40)

## 2019-02-06 LAB — COMPREHENSIVE METABOLIC PANEL
ALT: 168 IU/L — ABNORMAL HIGH (ref 0–32)
AST: 424 IU/L — ABNORMAL HIGH (ref 0–40)
Albumin/Globulin Ratio: 1.3 (ref 1.2–2.2)
Albumin: 3.2 g/dL — ABNORMAL LOW (ref 3.8–4.8)
Alkaline Phosphatase: 76 IU/L (ref 39–117)
BUN/Creatinine Ratio: 19 (ref 12–28)
BUN: 18 mg/dL (ref 8–27)
Bilirubin Total: <0.2 mg/dL (ref 0.0–1.2)
CO2: 28 mmol/L (ref 20–29)
Calcium: 8.5 mg/dL — ABNORMAL LOW (ref 8.7–10.3)
Chloride: 100 mmol/L (ref 96–106)
Creatinine, Ser: 0.97 mg/dL (ref 0.57–1.00)
GFR calc Af Amer: 69 mL/min/{1.73_m2} (ref >59)
GFR calc non Af Amer: 60 mL/min/{1.73_m2} (ref >59)
Globulin, Total: 2.4 g/dL (ref 1.5–4.5)
Glucose: 80 mg/dL (ref 65–99)
Potassium: 4.8 mmol/L (ref 3.5–5.2)
Sodium: 140 mmol/L (ref 134–144)
Total Protein: 5.6 g/dL — ABNORMAL LOW (ref 6.0–8.5)

## 2019-02-06 LAB — CBC WITH DIFFERENTIAL/PLATELET
Basophils Absolute: 0.1 10*3/uL (ref 0.0–0.2)
Basos: 1 %
EOS (ABSOLUTE): 0.1 10*3/uL (ref 0.0–0.4)
Eos: 1 %
Hematocrit: 27.5 % — ABNORMAL LOW (ref 34.0–46.6)
Hemoglobin: 8.1 g/dL — ABNORMAL LOW (ref 11.1–15.9)
Immature Grans (Abs): 0 10*3/uL (ref 0.0–0.1)
Immature Granulocytes: 0 %
Lymphocytes Absolute: 1.8 10*3/uL (ref 0.7–3.1)
Lymphs: 34 %
MCH: 23.3 pg — ABNORMAL LOW (ref 26.6–33.0)
MCHC: 29.5 g/dL — ABNORMAL LOW (ref 31.5–35.7)
MCV: 79 fL (ref 79–97)
Monocytes Absolute: 0.3 10*3/uL (ref 0.1–0.9)
Monocytes: 6 %
Neutrophils Absolute: 3 10*3/uL (ref 1.4–7.0)
Neutrophils: 58 %
Platelets: 345 10*3/uL (ref 150–450)
RBC: 3.47 x10E6/uL — ABNORMAL LOW (ref 3.77–5.28)
RDW: 20.1 % — ABNORMAL HIGH (ref 11.7–15.4)
WBC: 5.3 10*3/uL (ref 3.4–10.8)

## 2019-02-06 LAB — TSH: TSH: 106 u[IU]/mL — ABNORMAL HIGH (ref 0.450–4.500)

## 2019-02-07 ENCOUNTER — Encounter: Payer: Self-pay | Admitting: Family Medicine

## 2019-02-07 DIAGNOSIS — I890 Lymphedema, not elsewhere classified: Secondary | ICD-10-CM | POA: Insufficient documentation

## 2019-02-07 NOTE — Assessment & Plan Note (Signed)
Significant swelling today. No labs in >1 year. Will check labs and treat as needed. Await results.

## 2019-02-07 NOTE — Assessment & Plan Note (Signed)
Rechecking levels today. Await results. Treat as needed. Continue to monitor.

## 2019-02-07 NOTE — Assessment & Plan Note (Signed)
Will try to keep BP and cholesterol under good control. Continue to monitor. Call with any concerns.

## 2019-02-07 NOTE — Assessment & Plan Note (Signed)
On a very large amount of pain medicine from when she was on hospice. We will begin slowly weaning her down on this. Had been taking 30mg  every 4 hours- we will drop to this for 1 week and continue to decrease. She will likely need to see pain management to see if there is any interventional options to help. Referral generated today.

## 2019-02-07 NOTE — Assessment & Plan Note (Signed)
Significant swelling. Will likely need una boots. Referral to home health made today. We will check labs. Await labs. Continue to monitor. Call with any concerns.

## 2019-02-07 NOTE — Assessment & Plan Note (Signed)
Rechecking levels today. Await results and treat as needed.  

## 2019-02-07 NOTE — Assessment & Plan Note (Signed)
Not under good control. We will check labs as they have not been checked in over a year. Await results. Treat as needed.

## 2019-02-08 ENCOUNTER — Telehealth: Payer: Self-pay

## 2019-02-08 NOTE — Telephone Encounter (Signed)
Copied from [place] [phone]. Topic: General - Inquiry >> Feb 08, 2019 12:29 PM Richardo Priest, NT wrote: Reason for CRM: Patient called in stating she would like to speak with PCP's nurse about something important. Please advise.

## 2019-02-08 NOTE — Telephone Encounter (Signed)
Spoke with patient. Patient was wondering when Home Health was coming in because patient does not have oxygen. Patient states she really needs her oxygen.   Spoke with Dr. Wynetta Emery and she is aware, but the referral has been made and verbal orders for nursing, OT, and PT have been made. Patient also had an appointment already scheduled with Dr. Wynetta Emery tomorrow.

## 2019-02-08 NOTE — Telephone Encounter (Signed)
Copied from [place] [id number]. Topic: General - Other >> Feb 04, 2019  4:02 PM Virl Axe D wrote: Reason for CRM: [place] with [facility] stated [facility] reached out to them because they discharged pt due to limited staffing. Pt wants to start home health services for nursing, PT and OT   Routing to provider.

## 2019-02-08 NOTE — Telephone Encounter (Signed)
[place] with [facility] notified.

## 2019-02-08 NOTE — Telephone Encounter (Signed)
Sounds great. OK for verbal orders.

## 2019-02-09 ENCOUNTER — Telehealth: Payer: Self-pay | Admitting: Family Medicine

## 2019-02-09 ENCOUNTER — Other Ambulatory Visit: Payer: Self-pay

## 2019-02-09 ENCOUNTER — Ambulatory Visit (INDEPENDENT_AMBULATORY_CARE_PROVIDER_SITE_OTHER): Payer: Medicare Other | Admitting: Family Medicine

## 2019-02-09 ENCOUNTER — Ambulatory Visit: Payer: Self-pay | Admitting: *Deleted

## 2019-02-09 ENCOUNTER — Encounter: Payer: Self-pay | Admitting: Family Medicine

## 2019-02-09 VITALS — BP 138/79 | HR 81 | Temp 98.3°F | Ht 65.0 in

## 2019-02-09 DIAGNOSIS — E782 Mixed hyperlipidemia: Secondary | ICD-10-CM

## 2019-02-09 DIAGNOSIS — R0602 Shortness of breath: Secondary | ICD-10-CM

## 2019-02-09 DIAGNOSIS — D509 Iron deficiency anemia, unspecified: Secondary | ICD-10-CM

## 2019-02-09 DIAGNOSIS — I890 Lymphedema, not elsewhere classified: Secondary | ICD-10-CM | POA: Diagnosis not present

## 2019-02-09 DIAGNOSIS — R34 Anuria and oliguria: Secondary | ICD-10-CM

## 2019-02-09 DIAGNOSIS — I10 Essential (primary) hypertension: Secondary | ICD-10-CM

## 2019-02-09 DIAGNOSIS — R3911 Hesitancy of micturition: Secondary | ICD-10-CM

## 2019-02-09 DIAGNOSIS — E039 Hypothyroidism, unspecified: Secondary | ICD-10-CM

## 2019-02-09 MED ORDER — LEVOTHYROXINE SODIUM 75 MCG PO TABS: ORAL_TABLET | ORAL | 3 refills | Status: DC

## 2019-02-09 NOTE — Telephone Encounter (Signed)
Home Health Verbal Orders - Caller/Agency: [facility] [place] Number: [phone] Requesting OT/PT/Skilled Nursing/Social Work/Speech Therapy: skilled nursing for blisters on foot Frequency: evaluation

## 2019-02-09 NOTE — Progress Notes (Signed)
BP 138/79   Pulse 81   Temp 98.3 F (36.8 C) (Oral)   Ht 5\' 5"  (1.651 m)   SpO2 97%   BMI 19.90 kg/m    Subjective:    Patient ID: [name], female    DOB: 12-09-1950, 68 y.o.   MRN: [id number]  HPI: [name] is a 68 y.o. female  Chief Complaint  Patient presents with  . Hypothyroidism  . Foot Swelling   Home health sent out PT this AM. Still has not seen nursing. Continues with lymphedema. Continues with draining feet. No sign of infection. On further questioning- she states she is dribbling urine. She is not peeing normally, but she does have urine coming out.   HYPOTHYROIDISM Thyroid control status:uncontrolled Satisfied with current treatment? no Medication side effects: no Medication compliance: Has been out of thyroid medicine for "a while" didn't get it from hospice Recent dose adjustment:no Fatigue: yes Cold intolerance: yes Heat intolerance: no Weight gain: no Weight loss: yes Constipation: no Diarrhea/loose stools: no Palpitations: no Lower extremity edema: no Anxiety/depressed mood: yes  ANEMIA- states that she cannot take IV iron because 'it makes her feel terrible' would like transfusion. Does not want to take oral iron either Anemia status: uncontrolled Etiology of anemia: iron deficency Duration of anemia treatment: has been off of everything for over a year- was on hospice and aged out Compliance with treatment: poor compliance Iron supplementation side effects: yes Severity of anemia: moderate Fatigue: yes Decreased exercise tolerance: yes  Dyspnea on exertion: yes Palpitations: yes Bleeding: no Pica: no  Relevant past medical, surgical, family and social history reviewed and updated as indicated. Interim medical history since our last visit reviewed. Allergies and medications reviewed and updated.  Review of Systems  Constitutional: Positive for activity change, fatigue and unexpected weight change. Negative for appetite  change, chills, diaphoresis and fever.  Respiratory: Positive for shortness of breath. Negative for apnea, cough, choking, chest tightness, wheezing and stridor.   Cardiovascular: Negative.   Gastrointestinal: Negative.   Musculoskeletal: Positive for arthralgias, back pain and myalgias. Negative for gait problem, joint swelling, neck pain and neck stiffness.  Skin: Negative.   Neurological: Negative.   Psychiatric/Behavioral: Negative.     Per HPI unless specifically indicated above     Objective:    BP 138/79   Pulse 81   Temp 98.3 F (36.8 C) (Oral)   Ht 5\' 5"  (1.651 m)   SpO2 97%   BMI 19.90 kg/m   Wt Readings from Last 3 Encounters:  12/11/17 119 lb 9.6 oz (54.3 kg)  10/09/17 124 lb (56.2 kg)  09/19/17 127 lb 12 oz (57.9 kg)    Physical Exam Vitals signs and nursing note reviewed.  Constitutional:      General: She is not in acute distress.    Appearance: Normal appearance. She is cachectic. She is ill-appearing. She is not toxic-appearing or diaphoretic.  HENT:     Head: Normocephalic and atraumatic.     Right Ear: External ear normal.     Left Ear: External ear normal.     Nose: Nose normal.     Mouth/Throat:     Mouth: Mucous membranes are moist.     Pharynx: Oropharynx is clear.  Eyes:     General: No scleral icterus.       Right eye: No discharge.        Left eye: No discharge.     Extraocular Movements: Extraocular movements intact.  Conjunctiva/sclera: Conjunctivae normal.     Pupils: Pupils are equal, round, and reactive to light.  Neck:     Musculoskeletal: Normal range of motion and neck supple.  Cardiovascular:     Rate and Rhythm: Normal rate and regular rhythm.     Pulses: Normal pulses.     Heart sounds: Normal heart sounds. No murmur. No friction rub. No gallop.   Pulmonary:     Effort: Pulmonary effort is normal. No respiratory distress.     Breath sounds: Normal breath sounds. No stridor. No wheezing, rhonchi or rales.  Chest:      Chest wall: No tenderness.  Musculoskeletal: Normal range of motion.  Skin:    General: Skin is warm and dry.     Capillary Refill: Capillary refill takes less than 2 seconds.     Coloration: Skin is not jaundiced or pale.     Findings: No bruising, erythema, lesion or rash.  Neurological:     General: No focal deficit present.     Mental Status: She is alert and oriented to person, place, and time. Mental status is at baseline.  Psychiatric:        Mood and Affect: Mood normal.        Behavior: Behavior normal.        Thought Content: Thought content normal.        Judgment: Judgment normal.     Results for orders placed or performed in visit on 02/05/19  CBC with Differential/Platelet  Result Value Ref Range   WBC 5.3 3.4 - 10.8 x10E3/uL   RBC 3.47 (L) 3.77 - 5.28 x10E6/uL   Hemoglobin 8.1 (L) 11.1 - 15.9 g/dL   Hematocrit 27.5 (L) 34.0 - 46.6 %   MCV 79 79 - 97 fL   MCH 23.3 (L) 26.6 - 33.0 pg   MCHC 29.5 (L) 31.5 - 35.7 g/dL   RDW 20.1 (H) 11.7 - 15.4 %   Platelets 345 150 - 450 x10E3/uL   Neutrophils 58 Not Estab. %   Lymphs 34 Not Estab. %   Monocytes 6 Not Estab. %   Eos 1 Not Estab. %   Basos 1 Not Estab. %   Neutrophils Absolute 3.0 1.4 - 7.0 x10E3/uL   Lymphocytes Absolute 1.8 0.7 - 3.1 x10E3/uL   Monocytes Absolute 0.3 0.1 - 0.9 x10E3/uL   EOS (ABSOLUTE) 0.1 0.0 - 0.4 x10E3/uL   Basophils Absolute 0.1 0.0 - 0.2 x10E3/uL   Immature Granulocytes 0 Not Estab. %   Immature Grans (Abs) 0.0 0.0 - 0.1 x10E3/uL  Comprehensive metabolic panel  Result Value Ref Range   Glucose 80 65 - 99 mg/dL   BUN 18 8 - 27 mg/dL   Creatinine, Ser 0.97 0.57 - 1.00 mg/dL   GFR calc non Af Amer 60 >59 mL/min/1.73   GFR calc Af Amer 69 >59 mL/min/1.73   BUN/Creatinine Ratio 19 12 - 28   Sodium 140 134 - 144 mmol/L   Potassium 4.8 3.5 - 5.2 mmol/L   Chloride 100 96 - 106 mmol/L   CO2 28 20 - 29 mmol/L   Calcium 8.5 (L) 8.7 - 10.3 mg/dL   Total Protein 5.6 (L) 6.0 - 8.5 g/dL    Albumin 3.2 (L) 3.8 - 4.8 g/dL   Globulin, Total 2.4 1.5 - 4.5 g/dL   Albumin/Globulin Ratio 1.3 1.2 - 2.2   Bilirubin Total <0.2 0.0 - 1.2 mg/dL   Alkaline Phosphatase 76 39 - 117 IU/L   AST 424 (H)  0 - 40 IU/L   ALT 168 (H) 0 - 32 IU/L  Bayer DCA Hb A1c Waived  Result Value Ref Range   HB A1C (BAYER DCA - WAIVED) 4.9 <7.0 %  Lipid Panel w/o Chol/HDL Ratio  Result Value Ref Range   Cholesterol, Total 149 100 - 199 mg/dL   Triglycerides 97 0 - 149 mg/dL   HDL 51 >39 mg/dL   VLDL Cholesterol Cal 18 5 - 40 mg/dL   LDL Chol Calc (NIH) 80 0 - 99 mg/dL  TSH  Result Value Ref Range   TSH 106.000 (H) 0.450 - 4.500 uIU/mL      Assessment & Plan:   Problem List Items Addressed This Visit      Cardiovascular and Mediastinum   HTN (hypertension)    Under good control off medicine. Will hold on restarting. Continue to monitor.        Endocrine   Hypothyroidism    Has been off her synthroid with a TSH of 106- will restart her synthroid at 72mcg and recheck in 6 weeks. Hopefully will help with her swelling.       Relevant Medications   levothyroxine (SYNTHROID) 75 MCG tablet     Other   Hyperlipidemia    Under good control off medicine. Will hold on restarting. Continue to monitor.      Iron deficiency anemia    Hgb 8.1. Will get her back into hematology- referral generated today. Await their input.       Relevant Orders   Ambulatory referral to Hematology   Lymphedema of both lower extremities - Primary    Needs Unna boots- will work on getting home health out. Referral generated on Friday, PT is already out- awaiting nurse and wound visits.       Relevant Orders   Referral to Chronic Care Management Services    Other Visit Diagnoses    SOB (shortness of breath)       Anemia likely contributing. Was on home oxygen at night. O2 fine here- will obtain noctural pulse ox- ordered today. Await results.    Relevant Orders   Ambulatory referral to Sleep Studies   Anuria        Not an accurate statement- will get her into urology to see why she's just dribbling if not improving with her thyroid being under better control.    Hesitancy       Able to pee, but just dribbling- will get thryoid treated, and if not improving, will get her into urology.       Follow up plan: Return Friday- virtual.

## 2019-02-09 NOTE — Telephone Encounter (Addendum)
Merlene Morse Minor, RN spoke with [place] with [facility] and the [facility] Nurse is coming tomorrow. She also placed a verbal order for SW for [employer]. She also talked with Leroy Sea at [place] and am placing the order for the [facility] Bed

## 2019-02-09 NOTE — Assessment & Plan Note (Signed)
Has been off her synthroid with a TSH of 106- will restart her synthroid at 85mcg and recheck in 6 weeks. Hopefully will help with her swelling.

## 2019-02-09 NOTE — Telephone Encounter (Signed)
OK for verbal orders- also needs UNNA boot, please make sure they bring that stuff when they go

## 2019-02-09 NOTE — Assessment & Plan Note (Signed)
Under good control off medicine. Will hold on restarting. Continue to monitor.

## 2019-02-09 NOTE — Assessment & Plan Note (Signed)
Needs Unna boots- will work on getting home health out. Referral generated on Friday, PT is already out- awaiting nurse and wound visits.

## 2019-02-09 NOTE — Telephone Encounter (Signed)
Christy Sartorius notified of verbal ok from Dr. Wynetta Emery.

## 2019-02-09 NOTE — Assessment & Plan Note (Signed)
Hgb 8.1. Will get her back into hematology- referral generated today. Await their input.

## 2019-02-09 NOTE — Patient Instructions (Addendum)
*  Home health should be calling you for the nursing visit- keep your feet up until they get the boots on you *PT will keep coming out *We are getting you set for a home sleep study to check your oxygen at night *We are putting in for the hospital bed- Janci will help [place]  *The blood doctor will call you about your anemia *Dr. Holley Raring doesn't need to see you because your kidneys are so good, so we are getting an [place] to find out why you're not peeing *Restart the thyroid medicine (69mcg) and we'll recheck it in 6 weeks *No blood pressure or cholesterol medicine right now.  * I'll see you on the phone at the end of the week to see how your pain is doing.

## 2019-02-09 NOTE — Chronic Care Management (AMB) (Signed)
Chronic Care Management   Initial Visit Note  02/09/2019 Name: [name] MRN: [id number] DOB: 1950/07/17  Referred by: Valerie Roys, DO Reason for referral : Chronic Care Management (Lymph edema ) and Care Coordination (DME and HH )   [name] is a 68 y.o. year old female who is a primary care patient of Valerie Roys, DO. The CCM team was consulted for assistance with chronic disease management and care coordination needs related to CHF CAD Lymph edema, chronic Pain, Anemia   Met with patient in the PCP office today. Patient was recently discharged from Hospice services in which she was provided a hospital bed related to the patient requires the ability to reposition frequently. She needs the [facility] of her bed raised to 30 degrees and related to patient's lymph edema needs the the ability to elevate  the foot of her bed.     Review of patient status, including review of consultants reports, relevant laboratory and other test results, and collaboration with appropriate care team members and the patient's provider was performed as part of comprehensive patient evaluation and provision of chronic care management services.    SDOH (Social Determinants of Health) screening performed today: Financial Strain  Physical Activity. See Care Plan for related entries.   Advanced Directives Status: N See Care Plan and Vynca application for related entries.   Medications: Outpatient Encounter Medications as of 02/09/2019  Medication Sig Note  . Aspirin-Salicylamide-Caffeine (BC HEADACHE POWDER PO) Take by mouth daily.   [name] docusate sodium (COLACE) 100 MG capsule Take 1 capsule (100 mg total) by mouth as needed.   . furosemide (LASIX) 20 MG tablet Take by mouth as needed.    [name] levothyroxine (SYNTHROID) 75 MCG tablet TAKE 1 TABLET BY MOUTH ONCE DAILY BEFOREBREAKFAST   . omeprazole (PRILOSEC) 20 MG capsule Take 1 capsule (20 mg total) by mouth daily. 02/09/2019: As needed  . oxycodone  (ROXICODONE) 30 MG immediate release tablet Take 1 tablet (30 mg total) by mouth every 4 (four) hours as needed for pain.   . potassium chloride (K-DUR) 10 MEQ tablet Take 1 tablet (10 mEq total) by mouth 2 (two) times daily. (Patient taking differently: Take 10 mEq by mouth as needed. )    No facility-administered encounter medications on file as of 02/09/2019.      Objective:   Goals Addressed            This Visit's Progress   . I need a hospital bed ASAP (pt-stated)       Current Barriers:  . Financial Constraints.  . Chronic Disease Management support and education needs related to CHF, Chronic Pain, iron deficiency anemia and Lymph Edema  Nurse Case Manager Clinical Goal(s):  [name] Over the next 30 days, patient will work with [facility] to address needs related to recently being discharged by Hospice. . Over the next 30 days, patient will work with [place] (community agency) to obtain patient a hospital Bed .   Interventions:  . Collaborated with Brad at [facility]  regarding Patient's immediate need for a Hospital bed.  . Discussed plans with patient for ongoing care management follow up and provided patient with direct contact information for care management team . Collaborated with [facility] agency rep Lauretta Chester to inquire when [place] would be going to patient's home to address lymph edema. . Also gave a verbal order to also include [place] SW for community resources per patient's voiced concerns over low financial  resources.  Hanley Seamen patient ensure samples and coupons today.   Patient Self Care Activities:  . Self administers medications as prescribed . Attends all scheduled provider appointments . Unable to independently get into and position self in her regular bed and Unable to perform IADLs independently  Initial goal documentation         [name] was given information about Chronic Care Management services today including:  1. CCM service includes personalized  support from designated clinical staff supervised by her physician, including individualized plan of care and coordination with other care providers 2. 24/7 contact phone numbers for assistance for urgent and routine care needs. 3. Service will only be billed when office clinical staff spend 20 minutes or more in a month to coordinate care. 4. Only one practitioner may furnish and bill the service in a calendar month. 5. The patient may stop CCM services at any time (effective at the end of the month) by phone call to the office staff. 6. The patient will be responsible for cost sharing (co-pay) of up to 20% of the service fee (after annual deductible is met).  Patient agreed to services and verbal consent obtained.   Plan:   The care management team will reach out to the patient again over the next 30 days.   Merlene Morse Nhyla Nappi RN, BSN Nurse Case Editor, commissioning Family Practice/[facility] Care Management  [phone]) Business Mobile

## 2019-02-09 NOTE — Patient Instructions (Signed)
Thank you allowing the Chronic Care Management Team to be a part of your care! It was a pleasure speaking with you today!   CCM (Chronic Care Management) Team   Emmarie Sannes RN, BSN Nurse Care Coordinator  [phone]  Catie [facility] PharmD  Clinical Pharmacist  [phone]  Eula Fried LCSW Clinical Social Worker [phone]  Goals Addressed            This Visit's Progress   . I need a hospital bed ASAP (pt-stated)       Current Barriers:  . Financial Constraints.  . Chronic Disease Management support and education needs related to CHF, Chronic Pain, iron deficiency anemia and Lymph Edema  Nurse Case Manager Clinical Goal(s):  [name] Over the next 30 days, patient will work with [facility] to address needs related to recently being discharged by Hospice. . Over the next 30 days, patient will work with [place] (community agency) to obtain patient a hospital Bed .   Interventions:  . Collaborated with Brad at [facility]  regarding Patient's immediate need for a Hospital bed.  . Discussed plans with patient for ongoing care management follow up and provided patient with direct contact information for care management team . Collaborated with [facility] agency rep Lauretta Chester to inquire when [place] would be going to patient's home to address lymph edema. . Also gave a verbal order to also include [place] SW for community resources per patient's voiced concerns over low financial resources.  Hanley Seamen patient ensure samples and coupons today.   Patient Self Care Activities:  . Self administers medications as prescribed . Attends all scheduled provider appointments . Unable to independently get into and position self in her regular bed and Unable to perform IADLs independently  Initial goal documentation        The patient verbalized understanding of instructions provided today and declined a print copy of patient instruction materials.   The patient has been provided with  contact information for the care management team and has been advised to call with any health related questions or concerns.

## 2019-02-09 NOTE — Telephone Encounter (Signed)
Copied from [place] [id number]. Topic: Quick Communication - See Telephone Encounter >> Feb 09, 2019  3:49 PM [place] wrote: CRM for notification. See Telephone encounter for: 02/09/19. [place] from [facility] returning call [phone]

## 2019-02-10 ENCOUNTER — Telehealth: Payer: Self-pay | Admitting: Family Medicine

## 2019-02-10 NOTE — Telephone Encounter (Signed)
Called and spoke with Carla Drape, she wanted to know what type of unna boot the doctor wanted, verbal from [place], to go ahead with a zinc unna boot.

## 2019-02-10 NOTE — Telephone Encounter (Signed)
Copied from [place] [phone]. Topic: General - Other >> Feb 10, 2019 12:30 PM Keene Breath wrote: Reason for CRM: [facility] called to speak with the nurse regarding orders for the patient.  Please call to discuss at [phone]

## 2019-02-12 ENCOUNTER — Ambulatory Visit (INDEPENDENT_AMBULATORY_CARE_PROVIDER_SITE_OTHER): Payer: Medicare Other | Admitting: Family Medicine

## 2019-02-12 ENCOUNTER — Inpatient Hospital Stay: Payer: Medicare Other | Admitting: Oncology

## 2019-02-12 ENCOUNTER — Other Ambulatory Visit: Payer: Medicare Other

## 2019-02-12 ENCOUNTER — Encounter: Payer: Self-pay | Admitting: Family Medicine

## 2019-02-12 ENCOUNTER — Telehealth: Payer: Self-pay | Admitting: Family Medicine

## 2019-02-12 ENCOUNTER — Ambulatory Visit: Payer: Self-pay | Admitting: *Deleted

## 2019-02-12 ENCOUNTER — Other Ambulatory Visit: Payer: Self-pay

## 2019-02-12 VITALS — BP 98/51 | HR 85 | Temp 98.2°F | Wt 110.0 lb

## 2019-02-12 DIAGNOSIS — M7989 Other specified soft tissue disorders: Secondary | ICD-10-CM | POA: Diagnosis not present

## 2019-02-12 DIAGNOSIS — G894 Chronic pain syndrome: Secondary | ICD-10-CM | POA: Diagnosis not present

## 2019-02-12 MED ORDER — OXYCODONE HCL 30 MG PO TABS: 30.0000 mg | ORAL_TABLET | ORAL | 0 refills | Status: DC | PRN

## 2019-02-12 NOTE — Telephone Encounter (Signed)
Called and let Jeneen Rinks know what Dr. Wynetta Emery said. He states the nurse will be going out and he will let them know about the [place] for the patient.

## 2019-02-12 NOTE — Telephone Encounter (Signed)
[name] home health called stating patient canceled her home health visit due to patient has a sore on her foot and PCP wanted to wait until it was healed.  [name] always reported she does not have a bed. [name] wants to see if there is an order for a medical bed.  [name] [phone]

## 2019-02-12 NOTE — Assessment & Plan Note (Addendum)
On very high dose pain medicine from when she was on hospice- dose has been lowered already from 540mg  MEDD to 270mg  MEDD- would like to continue to decrease, but given increased pain right now, will hold on decreasing until she has her hospital bed and gets her unna boots. Will give her 1 week of current dose (q4hour 30mg  oxycodone) and recheck 1 week with goal of decreasing further.

## 2019-02-12 NOTE — Progress Notes (Signed)
BP (!) 98/51   Pulse 85   Temp 98.2 F (36.8 C) (Oral)   Wt 110 lb (49.9 kg)   SpO2 90%   BMI 18.30 kg/m    Subjective:    Patient ID: [name], female    DOB: 1950/06/17, 68 y.o.   MRN: [id number]  HPI: [name] is a 68 y.o. female  Chief Complaint  Patient presents with  . Follow-up   CHRONIC PAIN- [name] presents today via telephone visit for follow up on her pain- pain has not been doing well. She notes that her pain has been severe. She has been waking up every 30 minutes when she is trying to spread out the medicine to every 6 hours, and has not been doing well- has been hurting into her calf, taking her medicine as prescibed- notes that it has been a bit more swollen. Still has not gotten her legs wrapped or her hospital bed- home health is working on it.  Present dose: 270 Morphine equivalents Pain control status: uncontrolled Duration: chronic Location: lower legs Quality: aching Current Pain Level: severe Previous Pain Level: moderate Breakthrough pain: yes Benefit from narcotic medications: yes What Activities task can be accomplished with current medication? Able to do her ADLs Interested in weaning off narcotics:yes   Stool softners/OTC fiber: no  Previous pain specialty evaluation: no Non-narcotic analgesic meds: no Narcotic contract: no  Relevant past medical, surgical, family and social history reviewed and updated as indicated. Interim medical history since our last visit reviewed. Allergies and medications reviewed and updated.  Review of Systems  Constitutional: Positive for fatigue. Negative for activity change, appetite change, chills, diaphoresis, fever and unexpected weight change.  HENT: Negative.   Respiratory: Negative.   Cardiovascular: Positive for leg swelling. Negative for chest pain and palpitations.  Gastrointestinal: Negative.   Musculoskeletal: Positive for arthralgias and myalgias. Negative for back pain, gait  problem, joint swelling, neck pain and neck stiffness.  Skin: Negative.   Neurological: Negative.   Psychiatric/Behavioral: Negative.     Per HPI unless specifically indicated above     Objective:    BP (!) 98/51   Pulse 85   Temp 98.2 F (36.8 C) (Oral)   Wt 110 lb (49.9 kg)   SpO2 90%   BMI 18.30 kg/m   Wt Readings from Last 3 Encounters:  02/12/19 110 lb (49.9 kg)  12/11/17 119 lb 9.6 oz (54.3 kg)  10/09/17 124 lb (56.2 kg)    Physical Exam Vitals signs and nursing note reviewed.  Pulmonary:     Effort: Pulmonary effort is normal. No respiratory distress.     Comments: Speaking in full sentences Neurological:     Mental Status: She is alert.  Psychiatric:        Mood and Affect: Mood normal.        Behavior: Behavior normal.        Thought Content: Thought content normal.        Judgment: Judgment normal.     Results for orders placed or performed in visit on 02/05/19  CBC with Differential/Platelet  Result Value Ref Range   WBC 5.3 3.4 - 10.8 x10E3/uL   RBC 3.47 (L) 3.77 - 5.28 x10E6/uL   Hemoglobin 8.1 (L) 11.1 - 15.9 g/dL   Hematocrit 27.5 (L) 34.0 - 46.6 %   MCV 79 79 - 97 fL   MCH 23.3 (L) 26.6 - 33.0 pg   MCHC 29.5 (L) 31.5 - 35.7 g/dL  RDW 20.1 (H) 11.7 - 15.4 %   Platelets 345 150 - 450 x10E3/uL   Neutrophils 58 Not Estab. %   Lymphs 34 Not Estab. %   Monocytes 6 Not Estab. %   Eos 1 Not Estab. %   Basos 1 Not Estab. %   Neutrophils Absolute 3.0 1.4 - 7.0 x10E3/uL   Lymphocytes Absolute 1.8 0.7 - 3.1 x10E3/uL   Monocytes Absolute 0.3 0.1 - 0.9 x10E3/uL   EOS (ABSOLUTE) 0.1 0.0 - 0.4 x10E3/uL   Basophils Absolute 0.1 0.0 - 0.2 x10E3/uL   Immature Granulocytes 0 Not Estab. %   Immature Grans (Abs) 0.0 0.0 - 0.1 x10E3/uL  Comprehensive metabolic panel  Result Value Ref Range   Glucose 80 65 - 99 mg/dL   BUN 18 8 - 27 mg/dL   Creatinine, Ser 0.97 0.57 - 1.00 mg/dL   GFR calc non Af Amer 60 >59 mL/min/1.73   GFR calc Af Amer 69 >59  mL/min/1.73   BUN/Creatinine Ratio 19 12 - 28   Sodium 140 134 - 144 mmol/L   Potassium 4.8 3.5 - 5.2 mmol/L   Chloride 100 96 - 106 mmol/L   CO2 28 20 - 29 mmol/L   Calcium 8.5 (L) 8.7 - 10.3 mg/dL   Total Protein 5.6 (L) 6.0 - 8.5 g/dL   Albumin 3.2 (L) 3.8 - 4.8 g/dL   Globulin, Total 2.4 1.5 - 4.5 g/dL   Albumin/Globulin Ratio 1.3 1.2 - 2.2   Bilirubin Total <0.2 0.0 - 1.2 mg/dL   Alkaline Phosphatase 76 39 - 117 IU/L   AST 424 (H) 0 - 40 IU/L   ALT 168 (H) 0 - 32 IU/L  Bayer DCA Hb A1c Waived  Result Value Ref Range   HB A1C (BAYER DCA - WAIVED) 4.9 <7.0 %  Lipid Panel w/o Chol/HDL Ratio  Result Value Ref Range   Cholesterol, Total 149 100 - 199 mg/dL   Triglycerides 97 0 - 149 mg/dL   HDL 51 >39 mg/dL   VLDL Cholesterol Cal 18 5 - 40 mg/dL   LDL Chol Calc (NIH) 80 0 - 99 mg/dL  TSH  Result Value Ref Range   TSH 106.000 (H) 0.450 - 4.500 uIU/mL      Assessment & Plan:   Problem List Items Addressed This Visit      Other   Chronic pain syndrome - Primary    On very high dose pain medicine from when she was on hospice- dose has been lowered already from 540mg  MEDD to 270mg  MEDD- would like to continue to decrease, but given increased pain right now, will hold on decreasing until she has her hospital bed and gets her unna boots. Will give her 1 week of current dose (q4hour 30mg  oxycodone) and recheck 1 week with goal of decreasing further.       Relevant Medications   oxycodone (ROXICODONE) 30 MG immediate release tablet    Other Visit Diagnoses    Leg swelling       Increased pain moving from her knee to her calf- concern for DVT- will obtain US. Await results. Call with any concerns.    Relevant Orders   US Venous Img Lower Unilateral Left       Follow up plan: Return in about 1 week (around 02/19/2019).    . This visit was completed via telephone due to the restrictions of the COVID-19 pandemic. All issues as above were discussed and addressed but no  physical exam was performed.  If it was felt that the patient should be evaluated in the office, they were directed there. The patient verbally consented to this visit. Patient was unable to complete an audio/visual visit due to Lack of equipment. Due to the catastrophic nature of the COVID-19 pandemic, this visit was done through audio contact only. . Location of the patient: home . Location of the provider: work . Those involved with this call:  . Provider: Park Liter, DO . CMA: Lesle Chris, [place] . Front Desk/Registration: Don Perking  . Time spent on call: 25 minutes on the phone discussing health concerns. 40 minutes total spent in review of patient's record and preparation of their chart.

## 2019-02-12 NOTE — Chronic Care Management (AMB) (Signed)
Chronic Care Management   Follow Up Note   02/12/2019 Name: [name] MRN: [id number] DOB: [birth year]  Referred by: Valerie Roys, DO Reason for referral : Care Coordination [facility] Bed)   [name] is a 68 y.o. year old female who is a primary care patient of Valerie Roys, DO. The CCM team was consulted for assistance with chronic disease management and care coordination needs.    Review of patient status, including review of consultants reports, relevant laboratory and other test results, and collaboration with appropriate care team members and the patient's provider was performed as part of comprehensive patient evaluation and provision of chronic care management services.    SDOH (Social Determinants of Health) screening performed today: None. See Care Plan for related entries.   Advanced Directives Status: N See Care Plan and Vynca application for related entries.  Outpatient Encounter Medications as of 02/12/2019  Medication Sig Note  . Aspirin-Salicylamide-Caffeine (BC HEADACHE POWDER PO) Take by mouth daily.   [name] docusate sodium (COLACE) 100 MG capsule Take 1 capsule (100 mg total) by mouth as needed.   . furosemide (LASIX) 20 MG tablet Take by mouth as needed.    [name] levothyroxine (SYNTHROID) 75 MCG tablet TAKE 1 TABLET BY MOUTH ONCE DAILY BEFOREBREAKFAST   . omeprazole (PRILOSEC) 20 MG capsule Take 1 capsule (20 mg total) by mouth daily. 02/09/2019: As needed  . oxycodone (ROXICODONE) 30 MG immediate release tablet Take 1 tablet (30 mg total) by mouth every 4 (four) hours as needed for pain.   . potassium chloride (K-DUR) 10 MEQ tablet Take 1 tablet (10 mEq total) by mouth 2 (two) times daily. (Patient taking differently: Take 10 mEq by mouth as needed. )    No facility-administered encounter medications on file as of 02/12/2019.      Goals Addressed            This Visit's Progress   . I need a hospital bed ASAP (pt-stated)       Current Barriers:   . Financial Constraints.  . Chronic Disease Management support and education needs related to CHF, Chronic Pain, iron deficiency anemia and Lymph Edema  Nurse Case Manager Clinical Goal(s):  [name] Over the next 30 days, patient will work with [facility] to address needs related to recently being discharged by Hospice. . Over the next 30 days, patient will work with [place] (community agency) to obtain patient a hospital Bed   Interventions:  . Collaborated with Brad at [facility]  regarding Patient's immediate need for a Hospital bed.  . Discussed plans with patient for ongoing care management follow up and provided patient with direct contact information for care management team . Call placed to [facility] with [employer] to make him aware patient's bed had not been delivered. He is checking into it and will call me back.   . Call back received from Brad with [employer], he stated insurance initially denied the bed because patient was continuing to show hospice as primary.  [name] He is working to resolve the issue and get the bed delivered ASAP.   Patient Self Care Activities:  . Self administers medications as prescribed . Attends all scheduled provider appointments . Unable to independently get into and position self in her regular bed and Unable to perform IADLs independently  Initial goal documentation         The patient has been provided with contact information for the care management team and has been  advised to call with any health related questions or concerns.    Merlene Morse Malek Skog RN, BSN Nurse Case Editor, commissioning Family Practice/[facility] Care Management  [phone]) Business Mobile

## 2019-02-12 NOTE — Telephone Encounter (Signed)
I did not tell her I want it to heal. I want them to do wound care on it and get her into UNNA boots. We are working on a hospital bed

## 2019-02-15 ENCOUNTER — Ambulatory Visit: Admission: RE | Admit: 2019-02-15 | Payer: Medicare Other | Source: Ambulatory Visit

## 2019-02-16 ENCOUNTER — Ambulatory Visit: Payer: Medicare Other | Admitting: *Deleted

## 2019-02-16 DIAGNOSIS — R0602 Shortness of breath: Secondary | ICD-10-CM

## 2019-02-16 DIAGNOSIS — M7989 Other specified soft tissue disorders: Secondary | ICD-10-CM

## 2019-02-16 DIAGNOSIS — I5033 Acute on chronic diastolic (congestive) heart failure: Secondary | ICD-10-CM

## 2019-02-16 NOTE — Telephone Encounter (Signed)
[name] calling from home health called and stated that she was unable to dress the wound how dr Wynetta Emery wanted because they did not have supplies. She did use what she had on hand and will return when supplies come in. [name] states that the wound was 4+ edema but no open wound.

## 2019-02-16 NOTE — Telephone Encounter (Signed)
FYI

## 2019-02-16 NOTE — Telephone Encounter (Signed)
Noted thanks °

## 2019-02-18 ENCOUNTER — Inpatient Hospital Stay: Payer: Medicare Other | Admitting: Oncology

## 2019-02-18 ENCOUNTER — Encounter: Payer: Self-pay | Admitting: Family Medicine

## 2019-02-18 ENCOUNTER — Other Ambulatory Visit: Payer: Self-pay

## 2019-02-18 ENCOUNTER — Ambulatory Visit (INDEPENDENT_AMBULATORY_CARE_PROVIDER_SITE_OTHER): Payer: Medicare Other | Admitting: Family Medicine

## 2019-02-18 DIAGNOSIS — G894 Chronic pain syndrome: Secondary | ICD-10-CM | POA: Diagnosis not present

## 2019-02-18 MED ORDER — OXYCODONE HCL 5 MG PO TABS: 5.0000 mg | ORAL_TABLET | ORAL | 0 refills | Status: DC | PRN

## 2019-02-18 MED ORDER — OXYCODONE HCL 20 MG PO TABS: 20.0000 mg | ORAL_TABLET | ORAL | 0 refills | Status: DC | PRN

## 2019-02-18 NOTE — Progress Notes (Signed)
BP (!) 110/59   Pulse 80   Temp 98.2 F (36.8 C)   SpO2 92%    Subjective:    Patient ID: [name], female    DOB: 02-09-1951, 68 y.o.   MRN: [id number]  HPI: [name] is a 68 y.o. female  Chief Complaint  Patient presents with  . Pain   CHRONIC PAIN- was not able to go to get her US done on Monday- they are going to get her rescheduled. She got her hospital bed. She had her legs wrapped today- just happened this afternoon, got her bed on Saturday, she has been taking 20mg  oxycodone and then about 10mg  about 45 minutes later Present dose: 270 Morphine equivalents Pain control status: uncontrolled- states that the medicine is lasting about 4-5 hours, then she's having pain in her foot Duration: chronic Location: widespread Quality: aching and sore Current Pain Level: severe Previous Pain Level: severe Breakthrough pain: yes Benefit from narcotic medications: yes What Activities task can be accomplished with current medication? Interested in weaning off narcotics:yes   Stool softners/OTC fiber: no  Previous pain specialty evaluation: yes Non-narcotic analgesic meds: no Narcotic contract: yes  Has the stuff for the home oxygen monitor at home, but hasn't done it yet. Fell asleep after taking her medicine.  Relevant past medical, surgical, family and social history reviewed and updated as indicated. Interim medical history since our last visit reviewed. Allergies and medications reviewed and updated.  Review of Systems  Constitutional: Negative.   Respiratory: Negative.   Cardiovascular: Negative.   Musculoskeletal: Positive for arthralgias, back pain and myalgias. Negative for gait problem, joint swelling, neck pain and neck stiffness.  Skin: Negative.   Neurological: Negative.   Psychiatric/Behavioral: Negative.     Per HPI unless specifically indicated above     Objective:    BP (!) 110/59   Pulse 80   Temp 98.2 F (36.8 C)   SpO2 92%   Wt  Readings from Last 3 Encounters:  02/12/19 110 lb (49.9 kg)  12/11/17 119 lb 9.6 oz (54.3 kg)  10/09/17 124 lb (56.2 kg)    Physical Exam Vitals signs and nursing note reviewed.  Pulmonary:     Effort: Pulmonary effort is normal. No respiratory distress.     Comments: Speaking in full sentences Neurological:     Mental Status: She is alert.  Psychiatric:        Mood and Affect: Mood normal.        Behavior: Behavior normal.        Thought Content: Thought content normal.        Judgment: Judgment normal.     Results for orders placed or performed in visit on 02/05/19  CBC with Differential/Platelet  Result Value Ref Range   WBC 5.3 3.4 - 10.8 x10E3/uL   RBC 3.47 (L) 3.77 - 5.28 x10E6/uL   Hemoglobin 8.1 (L) 11.1 - 15.9 g/dL   Hematocrit 27.5 (L) 34.0 - 46.6 %   MCV 79 79 - 97 fL   MCH 23.3 (L) 26.6 - 33.0 pg   MCHC 29.5 (L) 31.5 - 35.7 g/dL   RDW 20.1 (H) 11.7 - 15.4 %   Platelets 345 150 - 450 x10E3/uL   Neutrophils 58 Not Estab. %   Lymphs 34 Not Estab. %   Monocytes 6 Not Estab. %   Eos 1 Not Estab. %   Basos 1 Not Estab. %   Neutrophils Absolute 3.0 1.4 - 7.0 x10E3/uL  Lymphocytes Absolute 1.8 0.7 - 3.1 x10E3/uL   Monocytes Absolute 0.3 0.1 - 0.9 x10E3/uL   EOS (ABSOLUTE) 0.1 0.0 - 0.4 x10E3/uL   Basophils Absolute 0.1 0.0 - 0.2 x10E3/uL   Immature Granulocytes 0 Not Estab. %   Immature Grans (Abs) 0.0 0.0 - 0.1 x10E3/uL  Comprehensive metabolic panel  Result Value Ref Range   Glucose 80 65 - 99 mg/dL   BUN 18 8 - 27 mg/dL   Creatinine, Ser 0.97 0.57 - 1.00 mg/dL   GFR calc non Af Amer 60 >59 mL/min/1.73   GFR calc Af Amer 69 >59 mL/min/1.73   BUN/Creatinine Ratio 19 12 - 28   Sodium 140 134 - 144 mmol/L   Potassium 4.8 3.5 - 5.2 mmol/L   Chloride 100 96 - 106 mmol/L   CO2 28 20 - 29 mmol/L   Calcium 8.5 (L) 8.7 - 10.3 mg/dL   Total Protein 5.6 (L) 6.0 - 8.5 g/dL   Albumin 3.2 (L) 3.8 - 4.8 g/dL   Globulin, Total 2.4 1.5 - 4.5 g/dL   Albumin/Globulin  Ratio 1.3 1.2 - 2.2   Bilirubin Total <0.2 0.0 - 1.2 mg/dL   Alkaline Phosphatase 76 39 - 117 IU/L   AST 424 (H) 0 - 40 IU/L   ALT 168 (H) 0 - 32 IU/L  Bayer DCA Hb A1c Waived  Result Value Ref Range   HB A1C (BAYER DCA - WAIVED) 4.9 <7.0 %  Lipid Panel w/o Chol/HDL Ratio  Result Value Ref Range   Cholesterol, Total 149 100 - 199 mg/dL   Triglycerides 97 0 - 149 mg/dL   HDL 51 >39 mg/dL   VLDL Cholesterol Cal 18 5 - 40 mg/dL   LDL Chol Calc (NIH) 80 0 - 99 mg/dL  TSH  Result Value Ref Range   TSH 106.000 (H) 0.450 - 4.500 uIU/mL      Assessment & Plan:   Problem List Items Addressed This Visit      Other   Chronic pain syndrome    Pain about the same- now has legs wrapped and hospital bed. Will start tapering her medicine, down to 25mg  q4hours down from 30mg  q4hours. Recheck 1 week. Call with any concerns.           Follow up plan: Return in about 1 week (around 02/25/2019).    . This visit was completed via telephone due to the restrictions of the COVID-19 pandemic. All issues as above were discussed and addressed but no physical exam was performed. If it was felt that the patient should be evaluated in the office, they were directed there. The patient verbally consented to this visit. Patient was unable to complete an audio/visual visit due to Lack of equipment. Due to the catastrophic nature of the COVID-19 pandemic, this visit was done through audio contact only. . Location of the patient: home . Location of the provider: work . Those involved with this call:  . Provider: Park Liter, DO . CMA: Tiffany Reel, CMA . Front Desk/Registration: Don Perking  . Time spent on call: 21 minutes on the phone discussing health concerns. 23 minutes total spent in review of patient's record and preparation of their chart.

## 2019-02-18 NOTE — Assessment & Plan Note (Signed)
Pain about the same- now has legs wrapped and hospital bed. Will start tapering her medicine, down to 25mg  q4hours down from 30mg  q4hours. Recheck 1 week. Call with any concerns.

## 2019-02-18 NOTE — Patient Instructions (Signed)
Thank you allowing the Chronic Care Management Team to be a part of your care! It was a pleasure speaking with you today!   CCM (Chronic Care Management) Team   Lodie Waheed RN, BSN Nurse Care Coordinator  [phone]  Catie [facility] PharmD  Clinical Pharmacist  [phone]  Eula Fried LCSW Clinical Social Worker [phone]  Goals Addressed            This Visit's Progress   . I am trying to manage my health (pt-stated)       Current Barriers:  [name] Knowledge deficit related to basic heart failure pathophysiology and self care management . Knowledge Deficits related to heart failure medications . Financial strain  Case Manager Clinical Goal(s):  [name] Over the next 90 days, patient will verbalize understanding of Heart Failure Action Plan and when to call doctor  Interventions:  . Provided verbal education on low sodium diet . Reviewed role of diuretics in prevention of fluid overload and management of heart failure . Patient stating she could use some ensure . Wellness nurse Tiffany assisted by [employer] rep to request symptoms . Patient stating [place] coming to wrap legs, patient states her legs are still swollen. . Patient receiving Home Sleep Study to assist with qualifying for home 02, which patient feels she needs.    Patient Self Care Activities:  . Takes Heart Failure Medications as prescribed  Initial goal documentation      . COMPLETED: I need a hospital bed ASAP (pt-stated)       Current Barriers:  . Financial Constraints.  . Chronic Disease Management support and education needs related to CHF, Chronic Pain, iron deficiency anemia and Lymph Edema  Nurse Case Manager Clinical Goal(s):  [name] Over the next 30 days, patient will work with [facility] to address needs related to recently being discharged by Hospice. . Over the next 30 days, patient will work with [place] (community agency) to obtain patient a hospital Bed   Interventions:  . Discussed  plans with patient for ongoing care management follow up and provided patient with direct contact information for care management team . Patient states her hospital bed with gel overlay delivered on Saturday and has been very helpful.   Patient Self Care Activities:  . Self administers medications as prescribed . Attends all scheduled provider appointments . Unable to independently get into and position self in her regular bed and Unable to perform IADLs independently  Initial goal documentation        The patient verbalized understanding of instructions provided today and declined a print copy of patient instruction materials.   The patient has been provided with contact information for the care management team and has been advised to call with any health related questions or concerns.

## 2019-02-18 NOTE — Chronic Care Management (AMB) (Signed)
Chronic Care Management   Follow Up Note   02/16/2019 Name: [name] MRN: [id number] DOB: 23-Oct-1950  Referred by: Valerie Roys, DO Reason for referral : Chronic Care Management (Leg Swelling, DME)   [name] is a 68 y.o. year old female who is a primary care patient of Valerie Roys, DO. The CCM team was consulted for assistance with chronic disease management and care coordination needs.    Review of patient status, including review of consultants reports, relevant laboratory and other test results, and collaboration with appropriate care team members and the patient's provider was performed as part of comprehensive patient evaluation and provision of chronic care management services.    SDOH (Social Determinants of Health) screening performed today: Armed forces logistics/support/administrative officer . See Care Plan for related entries.   Advanced Directives Status: N See Care Plan and Vynca application for related entries.  Outpatient Encounter Medications as of 02/16/2019  Medication Sig Note  . Aspirin-Salicylamide-Caffeine (BC HEADACHE POWDER PO) Take by mouth daily.   [name] docusate sodium (COLACE) 100 MG capsule Take 1 capsule (100 mg total) by mouth as needed.   . furosemide (LASIX) 20 MG tablet Take by mouth as needed.    [name] levothyroxine (SYNTHROID) 75 MCG tablet TAKE 1 TABLET BY MOUTH ONCE DAILY BEFOREBREAKFAST   . omeprazole (PRILOSEC) 20 MG capsule Take 1 capsule (20 mg total) by mouth daily. 02/09/2019: As needed  . potassium chloride (K-DUR) 10 MEQ tablet Take 1 tablet (10 mEq total) by mouth 2 (two) times daily. (Patient taking differently: Take 10 mEq by mouth as needed. )   . [DISCONTINUED] oxycodone (ROXICODONE) 30 MG immediate release tablet Take 1 tablet (30 mg total) by mouth every 4 (four) hours as needed for pain.    No facility-administered encounter medications on file as of 02/16/2019.      Goals Addressed            This Visit's Progress   . I am  trying to manage my health (pt-stated)       Current Barriers:  [name] Knowledge deficit related to basic heart failure pathophysiology and self care management . Knowledge Deficits related to heart failure medications . Financial strain  Case Manager Clinical Goal(s):  [name] Over the next 90 days, patient will verbalize understanding of Heart Failure Action Plan and when to call doctor  Interventions:  . Provided verbal education on low sodium diet . Reviewed role of diuretics in prevention of fluid overload and management of heart failure . Patient stating she could use some ensure . Wellness nurse Tiffany assisted by [employer] rep to request symptoms . Patient stating [place] coming to wrap legs, patient states her legs are still swollen. . Patient receiving Home Sleep Study to assist with qualifying for home 02, which patient feels she needs.    Patient Self Care Activities:  . Takes Heart Failure Medications as prescribed  Initial goal documentation      . COMPLETED: I need a hospital bed ASAP (pt-stated)       Current Barriers:  . Financial Constraints.  . Chronic Disease Management support and education needs related to CHF, Chronic Pain, iron deficiency anemia and Lymph Edema  Nurse Case Manager Clinical Goal(s):  [name] Over the next 30 days, patient will work with [facility] to address needs related to recently being discharged by Hospice. . Over the next 30 days, patient will work with [facility] (community agency) to obtain patient a hospital  Bed   Interventions:  . Discussed plans with patient for ongoing care management follow up and provided patient with direct contact information for care management team . Patient states her hospital bed with gel overlay delivered on Saturday and has been very helpful.   Patient Self Care Activities:  . Self administers medications as prescribed . Attends all scheduled provider appointments . Unable to independently get into and position self  in her regular bed and Unable to perform IADLs independently  Initial goal documentation         The care management team will reach out to the patient again over the next 30 days.  The patient has been provided with contact information for the care management team and has been advised to call with any health related questions or concerns.    Merlene Morse Lynesha Bango RN, BSN Nurse Case Editor, commissioning Family Practice/[facility] Care Management  [phone]) Business Mobile

## 2019-02-21 ENCOUNTER — Encounter: Payer: Self-pay | Admitting: Emergency Medicine

## 2019-02-21 ENCOUNTER — Emergency Department: Payer: Medicare Other

## 2019-02-21 ENCOUNTER — Inpatient Hospital Stay
Admission: EM | Admit: 2019-02-21 | Discharge: 2019-02-28 | DRG: 291 | Disposition: A | Discharge status: Hospice | Payer: Medicare Other | Attending: Internal Medicine | Admitting: Internal Medicine

## 2019-02-21 ENCOUNTER — Other Ambulatory Visit: Payer: Self-pay

## 2019-02-21 DIAGNOSIS — I739 Peripheral vascular disease, unspecified: Secondary | ICD-10-CM | POA: Diagnosis present

## 2019-02-21 DIAGNOSIS — D509 Iron deficiency anemia, unspecified: Secondary | ICD-10-CM | POA: Diagnosis present

## 2019-02-21 DIAGNOSIS — F329 Major depressive disorder, single episode, unspecified: Secondary | ICD-10-CM | POA: Diagnosis present

## 2019-02-21 DIAGNOSIS — G894 Chronic pain syndrome: Secondary | ICD-10-CM | POA: Diagnosis present

## 2019-02-21 DIAGNOSIS — D638 Anemia in other chronic diseases classified elsewhere: Secondary | ICD-10-CM | POA: Diagnosis present

## 2019-02-21 DIAGNOSIS — Z90710 Acquired absence of both cervix and uterus: Secondary | ICD-10-CM

## 2019-02-21 DIAGNOSIS — I110 Hypertensive heart disease with heart failure: Principal | ICD-10-CM | POA: Diagnosis present

## 2019-02-21 DIAGNOSIS — R64 Cachexia: Secondary | ICD-10-CM | POA: Diagnosis present

## 2019-02-21 DIAGNOSIS — Z716 Tobacco abuse counseling: Secondary | ICD-10-CM

## 2019-02-21 DIAGNOSIS — Z801 Family history of malignant neoplasm of trachea, bronchus and lung: Secondary | ICD-10-CM

## 2019-02-21 DIAGNOSIS — G2581 Restless legs syndrome: Secondary | ICD-10-CM | POA: Diagnosis present

## 2019-02-21 DIAGNOSIS — Z91138 Patient's unintentional underdosing of medication regimen for other reason: Secondary | ICD-10-CM

## 2019-02-21 DIAGNOSIS — Z8711 Personal history of peptic ulcer disease: Secondary | ICD-10-CM

## 2019-02-21 DIAGNOSIS — J189 Pneumonia, unspecified organism: Secondary | ICD-10-CM

## 2019-02-21 DIAGNOSIS — I428 Other cardiomyopathies: Secondary | ICD-10-CM | POA: Diagnosis present

## 2019-02-21 DIAGNOSIS — E039 Hypothyroidism, unspecified: Secondary | ICD-10-CM | POA: Diagnosis present

## 2019-02-21 DIAGNOSIS — Z681 Body mass index (BMI) 19 or less, adult: Secondary | ICD-10-CM | POA: Diagnosis not present

## 2019-02-21 DIAGNOSIS — J690 Pneumonitis due to inhalation of food and vomit: Secondary | ICD-10-CM | POA: Diagnosis present

## 2019-02-21 DIAGNOSIS — I5043 Acute on chronic combined systolic (congestive) and diastolic (congestive) heart failure: Secondary | ICD-10-CM | POA: Diagnosis present

## 2019-02-21 DIAGNOSIS — E876 Hypokalemia: Secondary | ICD-10-CM | POA: Diagnosis present

## 2019-02-21 DIAGNOSIS — J9601 Acute respiratory failure with hypoxia: Secondary | ICD-10-CM | POA: Diagnosis not present

## 2019-02-21 DIAGNOSIS — R0602 Shortness of breath: Secondary | ICD-10-CM

## 2019-02-21 DIAGNOSIS — E785 Hyperlipidemia, unspecified: Secondary | ICD-10-CM | POA: Diagnosis not present

## 2019-02-21 DIAGNOSIS — I2510 Atherosclerotic heart disease of native coronary artery without angina pectoris: Secondary | ICD-10-CM | POA: Diagnosis present

## 2019-02-21 DIAGNOSIS — Z79899 Other long term (current) drug therapy: Secondary | ICD-10-CM

## 2019-02-21 DIAGNOSIS — Z79890 Hormone replacement therapy (postmenopausal): Secondary | ICD-10-CM

## 2019-02-21 DIAGNOSIS — Z66 Do not resuscitate: Secondary | ICD-10-CM | POA: Diagnosis present

## 2019-02-21 DIAGNOSIS — J439 Emphysema, unspecified: Secondary | ICD-10-CM | POA: Diagnosis present

## 2019-02-21 DIAGNOSIS — L89312 Pressure ulcer of right buttock, stage 2: Secondary | ICD-10-CM | POA: Diagnosis present

## 2019-02-21 DIAGNOSIS — Z20828 Contact with and (suspected) exposure to other viral communicable diseases: Secondary | ICD-10-CM | POA: Diagnosis present

## 2019-02-21 DIAGNOSIS — F17210 Nicotine dependence, cigarettes, uncomplicated: Secondary | ICD-10-CM | POA: Diagnosis present

## 2019-02-21 DIAGNOSIS — Z515 Encounter for palliative care: Secondary | ICD-10-CM | POA: Diagnosis not present

## 2019-02-21 DIAGNOSIS — I5033 Acute on chronic diastolic (congestive) heart failure: Secondary | ICD-10-CM | POA: Diagnosis not present

## 2019-02-21 DIAGNOSIS — R1310 Dysphagia, unspecified: Secondary | ICD-10-CM | POA: Diagnosis present

## 2019-02-21 DIAGNOSIS — Z888 Allergy status to other drugs, medicaments and biological substances status: Secondary | ICD-10-CM

## 2019-02-21 DIAGNOSIS — I959 Hypotension, unspecified: Secondary | ICD-10-CM | POA: Diagnosis not present

## 2019-02-21 DIAGNOSIS — Z9981 Dependence on supplemental oxygen: Secondary | ICD-10-CM

## 2019-02-21 DIAGNOSIS — E43 Unspecified severe protein-calorie malnutrition: Secondary | ICD-10-CM | POA: Diagnosis present

## 2019-02-21 DIAGNOSIS — Z95828 Presence of other vascular implants and grafts: Secondary | ICD-10-CM

## 2019-02-21 DIAGNOSIS — K219 Gastro-esophageal reflux disease without esophagitis: Secondary | ICD-10-CM | POA: Diagnosis present

## 2019-02-21 DIAGNOSIS — L8990 Pressure ulcer of unspecified site, unspecified stage: Secondary | ICD-10-CM | POA: Insufficient documentation

## 2019-02-21 DIAGNOSIS — I5023 Acute on chronic systolic (congestive) heart failure: Secondary | ICD-10-CM | POA: Diagnosis present

## 2019-02-21 DIAGNOSIS — Z885 Allergy status to narcotic agent status: Secondary | ICD-10-CM

## 2019-02-21 DIAGNOSIS — F419 Anxiety disorder, unspecified: Secondary | ICD-10-CM | POA: Diagnosis present

## 2019-02-21 DIAGNOSIS — I872 Venous insufficiency (chronic) (peripheral): Secondary | ICD-10-CM | POA: Diagnosis present

## 2019-02-21 DIAGNOSIS — Z8249 Family history of ischemic heart disease and other diseases of the circulatory system: Secondary | ICD-10-CM

## 2019-02-21 DIAGNOSIS — Z7189 Other specified counseling: Secondary | ICD-10-CM | POA: Diagnosis not present

## 2019-02-21 LAB — PROCALCITONIN: Procalcitonin: <0.1 ng/mL

## 2019-02-21 LAB — CBC WITH DIFFERENTIAL/PLATELET
Abs Immature Granulocytes: 0.04 10*3/uL (ref 0.00–0.07)
Basophils Absolute: 0 10*3/uL (ref 0.0–0.1)
Basophils Relative: 1 %
Eosinophils Absolute: 0 10*3/uL (ref 0.0–0.5)
Eosinophils Relative: 0 %
HCT: 25.8 % — ABNORMAL LOW (ref 36.0–46.0)
Hemoglobin: 7.5 g/dL — ABNORMAL LOW (ref 12.0–15.0)
Immature Granulocytes: 1 %
Lymphocytes Relative: 18 %
Lymphs Abs: 1.2 10*3/uL (ref 0.7–4.0)
MCH: 23.9 pg — ABNORMAL LOW (ref 26.0–34.0)
MCHC: 29.1 g/dL — ABNORMAL LOW (ref 30.0–36.0)
MCV: 82.2 fL (ref 80.0–100.0)
Monocytes Absolute: 0.4 10*3/uL (ref 0.1–1.0)
Monocytes Relative: 6 %
Neutro Abs: 4.9 10*3/uL (ref 1.7–7.7)
Neutrophils Relative %: 74 %
Platelets: 394 10*3/uL (ref 150–400)
RBC: 3.14 MIL/uL — ABNORMAL LOW (ref 3.87–5.11)
RDW: 20 % — ABNORMAL HIGH (ref 11.5–15.5)
WBC: 6.6 10*3/uL (ref 4.0–10.5)
nRBC: 0 % (ref 0.0–0.2)

## 2019-02-21 LAB — BRAIN NATRIURETIC PEPTIDE: B Natriuretic Peptide: 508 pg/mL — ABNORMAL HIGH (ref 0.0–100.0)

## 2019-02-21 LAB — TROPONIN I (HIGH SENSITIVITY)
Troponin I (High Sensitivity): 27 ng/L — ABNORMAL HIGH (ref <18)
Troponin I (High Sensitivity): 27 ng/L — ABNORMAL HIGH (ref <18)

## 2019-02-21 LAB — URINALYSIS, COMPLETE (UACMP) WITH MICROSCOPIC
Bilirubin Urine: NEGATIVE
Glucose, UA: NEGATIVE mg/dL
Hgb urine dipstick: NEGATIVE
Ketones, ur: 5 mg/dL — AB
Leukocytes,Ua: NEGATIVE
Nitrite: NEGATIVE
Protein, ur: NEGATIVE mg/dL
Specific Gravity, Urine: 1.012 (ref 1.005–1.030)
Squamous Epithelial / HPF: NONE SEEN (ref 0–5)
pH: 5 (ref 5.0–8.0)

## 2019-02-21 LAB — COMPREHENSIVE METABOLIC PANEL
ALT: 146 U/L — ABNORMAL HIGH (ref 0–44)
AST: 181 U/L — ABNORMAL HIGH (ref 15–41)
Albumin: 2.8 g/dL — ABNORMAL LOW (ref 3.5–5.0)
Alkaline Phosphatase: 118 U/L (ref 38–126)
Anion gap: 12 (ref 5–15)
BUN: 14 mg/dL (ref 8–23)
CO2: 29 mmol/L (ref 22–32)
Calcium: 8.1 mg/dL — ABNORMAL LOW (ref 8.9–10.3)
Chloride: 98 mmol/L (ref 98–111)
Creatinine, Ser: 0.77 mg/dL (ref 0.44–1.00)
GFR calc Af Amer: >60 mL/min (ref >60)
GFR calc non Af Amer: >60 mL/min (ref >60)
Glucose, Bld: 78 mg/dL (ref 70–99)
Potassium: 3.5 mmol/L (ref 3.5–5.1)
Sodium: 139 mmol/L (ref 135–145)
Total Bilirubin: 0.4 mg/dL (ref 0.3–1.2)
Total Protein: 5.9 g/dL — ABNORMAL LOW (ref 6.5–8.1)

## 2019-02-21 LAB — BLOOD GAS, VENOUS
Acid-Base Excess: 6.9 mmol/L — ABNORMAL HIGH (ref 0.0–2.0)
Bicarbonate: 33 mmol/L — ABNORMAL HIGH (ref 20.0–28.0)
O2 Saturation: 27.3 %
Patient temperature: 37
pCO2, Ven: 52 mmHg (ref 44.0–60.0)
pH, Ven: 7.41 (ref 7.250–7.430)
pO2, Ven: <31 mmHg — CL (ref 32.0–45.0)

## 2019-02-21 LAB — SARS CORONAVIRUS 2 (TAT 6-24 HRS): SARS Coronavirus 2: NEGATIVE

## 2019-02-21 MED ORDER — IPRATROPIUM-ALBUTEROL 0.5-2.5 (3) MG/3ML IN SOLN
3.0000 mL | Freq: Once | RESPIRATORY_TRACT | Status: AC
  Administered 2019-02-21: 3 mL via RESPIRATORY_TRACT
  Filled 2019-02-21: qty 3

## 2019-02-21 MED ORDER — SODIUM CHLORIDE 0.9 % IV SOLN
1.0000 g | INTRAVENOUS | Status: DC
  Administered 2019-02-22 – 2019-02-23 (×2): 1 g via INTRAVENOUS
  Filled 2019-02-21 (×2): qty 1

## 2019-02-21 MED ORDER — AZITHROMYCIN 250 MG PO TABS
500.0000 mg | ORAL_TABLET | Freq: Every day | ORAL | Status: DC
  Administered 2019-02-22 – 2019-02-23 (×2): 500 mg via ORAL
  Filled 2019-02-21 (×2): qty 2

## 2019-02-21 MED ORDER — POTASSIUM CHLORIDE CRYS ER 10 MEQ PO TBCR
10.0000 meq | EXTENDED_RELEASE_TABLET | Freq: Two times a day (BID) | ORAL | Status: DC
  Filled 2019-02-21 (×2): qty 1

## 2019-02-21 MED ORDER — FUROSEMIDE 10 MG/ML IJ SOLN: 40.0000 mg | Freq: Two times a day (BID) | INTRAMUSCULAR | Status: DC

## 2019-02-21 MED ORDER — FUROSEMIDE 10 MG/ML IJ SOLN
20.0000 mg | Freq: Once | INTRAMUSCULAR | Status: AC
  Administered 2019-02-21: 20 mg via INTRAVENOUS
  Filled 2019-02-21: qty 2

## 2019-02-21 MED ORDER — LISINOPRIL 5 MG PO TABS
5.0000 mg | ORAL_TABLET | Freq: Every day | ORAL | Status: DC
  Administered 2019-02-21 – 2019-02-23 (×3): 5 mg via ORAL
  Filled 2019-02-21 (×3): qty 1

## 2019-02-21 MED ORDER — ENOXAPARIN SODIUM 40 MG/0.4ML ~~LOC~~ SOLN
40.0000 mg | SUBCUTANEOUS | Status: DC
  Administered 2019-02-21 – 2019-02-23 (×3): 40 mg via SUBCUTANEOUS
  Filled 2019-02-21 (×3): qty 0.4

## 2019-02-21 MED ORDER — LEVOTHYROXINE SODIUM 50 MCG PO TABS
75.0000 ug | ORAL_TABLET | Freq: Every day | ORAL | Status: DC
  Administered 2019-02-22 – 2019-02-23 (×2): 75 ug via ORAL
  Filled 2019-02-21 (×2): qty 1

## 2019-02-21 MED ORDER — OXYCODONE HCL 5 MG PO TABS
10.0000 mg | ORAL_TABLET | ORAL | Status: DC | PRN
  Administered 2019-02-22 – 2019-02-23 (×5): 10 mg via ORAL
  Filled 2019-02-21 (×5): qty 2

## 2019-02-21 MED ORDER — ACETAMINOPHEN 325 MG PO TABS
650.0000 mg | ORAL_TABLET | Freq: Four times a day (QID) | ORAL | Status: DC | PRN
  Administered 2019-02-21: 650 mg via ORAL
  Filled 2019-02-21: qty 2

## 2019-02-21 MED ORDER — OXYCODONE HCL 5 MG PO TABS
5.0000 mg | ORAL_TABLET | ORAL | Status: DC | PRN
  Administered 2019-02-21: 5 mg via ORAL
  Filled 2019-02-21: qty 1

## 2019-02-21 MED ORDER — FUROSEMIDE 10 MG/ML IJ SOLN
40.0000 mg | Freq: Two times a day (BID) | INTRAMUSCULAR | Status: DC
  Filled 2019-02-21: qty 4

## 2019-02-21 MED ORDER — ONDANSETRON HCL 4 MG/2ML IJ SOLN: 4.0000 mg | Freq: Four times a day (QID) | INTRAMUSCULAR | Status: DC | PRN

## 2019-02-21 MED ORDER — SODIUM CHLORIDE 0.9 % IV SOLN: 250.0000 mL | INTRAVENOUS | Status: DC | PRN

## 2019-02-21 MED ORDER — NICOTINE 14 MG/24HR TD PT24
14.0000 mg | MEDICATED_PATCH | Freq: Every day | TRANSDERMAL | Status: DC
  Administered 2019-02-22 – 2019-02-28 (×7): 14 mg via TRANSDERMAL
  Filled 2019-02-21 (×8): qty 1

## 2019-02-21 MED ORDER — ONDANSETRON HCL 4 MG PO TABS: 4.0000 mg | ORAL_TABLET | Freq: Four times a day (QID) | ORAL | Status: DC | PRN

## 2019-02-21 MED ORDER — SODIUM CHLORIDE 0.9 % IV SOLN
500.0000 mg | INTRAVENOUS | Status: DC
  Administered 2019-02-21: 500 mg via INTRAVENOUS
  Filled 2019-02-21: qty 500

## 2019-02-21 MED ORDER — ALBUTEROL SULFATE (2.5 MG/3ML) 0.083% IN NEBU
2.5000 mg | INHALATION_SOLUTION | RESPIRATORY_TRACT | Status: DC | PRN
  Administered 2019-02-23 – 2019-02-24 (×2): 2.5 mg via RESPIRATORY_TRACT
  Filled 2019-02-21 (×2): qty 3

## 2019-02-21 MED ORDER — SENNOSIDES-DOCUSATE SODIUM 8.6-50 MG PO TABS: 1.0000 | ORAL_TABLET | Freq: Every evening | ORAL | Status: DC | PRN

## 2019-02-21 MED ORDER — GUAIFENESIN 100 MG/5ML PO SOLN
5.0000 mL | ORAL | Status: DC | PRN
  Filled 2019-02-21: qty 5

## 2019-02-21 MED ORDER — ACETAMINOPHEN 650 MG RE SUPP: 650.0000 mg | Freq: Four times a day (QID) | RECTAL | Status: DC | PRN

## 2019-02-21 MED ORDER — SODIUM CHLORIDE 0.9% FLUSH
3.0000 mL | INTRAVENOUS | Status: DC | PRN
  Administered 2019-02-22: 3 mL via INTRAVENOUS
  Filled 2019-02-21: qty 3

## 2019-02-21 MED ORDER — SODIUM CHLORIDE 0.9% FLUSH
3.0000 mL | Freq: Two times a day (BID) | INTRAVENOUS | Status: DC
  Administered 2019-02-21 – 2019-02-24 (×6): 3 mL via INTRAVENOUS

## 2019-02-21 MED ORDER — SODIUM CHLORIDE 0.9 % IV SOLN
2.0000 g | INTRAVENOUS | Status: DC
  Administered 2019-02-21: 2 g via INTRAVENOUS
  Filled 2019-02-21: qty 20

## 2019-02-21 MED ORDER — FUROSEMIDE 10 MG/ML IJ SOLN
20.0000 mg | Freq: Once | INTRAMUSCULAR | Status: AC
  Administered 2019-02-21: 20 mg via INTRAVENOUS
  Filled 2019-02-21: qty 4

## 2019-02-21 MED ORDER — BISACODYL 5 MG PO TBEC: 5.0000 mg | DELAYED_RELEASE_TABLET | Freq: Every day | ORAL | Status: DC | PRN
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
## 2019-02-21 NOTE — Progress Notes (Signed)
Advanced Care Plan.  Purpose of Encounter: CODE STATUS and hospice Parties in Attendance: The patient and me. Patient's Decisional Capacity: Yes. Medical Story: [name]  is a 68 y.o. female with a known history of multiple medical problems including CHF, hypertension, hyperlipidemia, PAD, COPD, emphysema, chronic back pain, arthritis, etc.  The patient is being admitted for acute on chronic systolic CHF and CAP.  She was in hospice care for 1 year and was recently discharged from hospice care.  I discussed with patient about her current condition, prognosis, CODE STATUS and hospice care.  The patient does not know whether she wants to be resuscitated or intubated if she has cardiopulmonary arrest.  She hesitated to decide CODE STATUS.  But she wants hospice care and pain medication. Goals of Care Determinations: Palliative care. Plan:  Code Status: Full code. Other documents completed: Peri-care consult. Time spent discussing advance care planning: 20 minutes.

## 2019-02-21 NOTE — ED Triage Notes (Signed)
Pt to ER via EMS from home with c/o [facility] since rising this AM at 6.  Pt was recently released from [facility] and has not re-established with PCP.  Pt has not had most of her regular medications.  Pt able to talk in complete, uninterrupted sentences.  Pt noted to have wet sounding cough.

## 2019-02-21 NOTE — Progress Notes (Signed)
Patient states "i'm laying here dying." This RN reviewed code status with patient and patient states she would like to be full code because "I was on hospice for a year and I didn't die, may as well live now." Asked patient why she feels she is dying; she states "because I need pain medicine. 25 of percocet, pronto!" Offered patient tylenol as that is all we have right now. Patient denies chest pain; states "I just hurt all over because I haven't had my percocet." will continue to monitor.

## 2019-02-22 ENCOUNTER — Inpatient Hospital Stay (HOSPITAL_COMMUNITY)
Admit: 2019-02-22 | Discharge: 2019-02-22 | Disposition: A | Discharge status: Home / Self Care | Payer: Medicare Other | Attending: Internal Medicine | Admitting: Internal Medicine

## 2019-02-22 ENCOUNTER — Inpatient Hospital Stay: Payer: Medicare Other

## 2019-02-22 ENCOUNTER — Telehealth: Payer: Self-pay | Admitting: Family Medicine

## 2019-02-22 DIAGNOSIS — I5033 Acute on chronic diastolic (congestive) heart failure: Secondary | ICD-10-CM

## 2019-02-22 DIAGNOSIS — D509 Iron deficiency anemia, unspecified: Secondary | ICD-10-CM

## 2019-02-22 DIAGNOSIS — Z7189 Other specified counseling: Secondary | ICD-10-CM

## 2019-02-22 DIAGNOSIS — R0602 Shortness of breath: Secondary | ICD-10-CM

## 2019-02-22 DIAGNOSIS — E785 Hyperlipidemia, unspecified: Secondary | ICD-10-CM

## 2019-02-22 DIAGNOSIS — Z515 Encounter for palliative care: Secondary | ICD-10-CM

## 2019-02-22 LAB — CBC
HCT: 25.8 % — ABNORMAL LOW (ref 36.0–46.0)
Hemoglobin: 7.3 g/dL — ABNORMAL LOW (ref 12.0–15.0)
MCH: 23.4 pg — ABNORMAL LOW (ref 26.0–34.0)
MCHC: 28.3 g/dL — ABNORMAL LOW (ref 30.0–36.0)
MCV: 82.7 fL (ref 80.0–100.0)
Platelets: 391 10*3/uL (ref 150–400)
RBC: 3.12 MIL/uL — ABNORMAL LOW (ref 3.87–5.11)
RDW: 20.1 % — ABNORMAL HIGH (ref 11.5–15.5)
WBC: 6.9 10*3/uL (ref 4.0–10.5)
nRBC: 0 % (ref 0.0–0.2)

## 2019-02-22 LAB — BASIC METABOLIC PANEL
Anion gap: 9 (ref 5–15)
BUN: 13 mg/dL (ref 8–23)
CO2: 36 mmol/L — ABNORMAL HIGH (ref 22–32)
Calcium: 7.6 mg/dL — ABNORMAL LOW (ref 8.9–10.3)
Chloride: 97 mmol/L — ABNORMAL LOW (ref 98–111)
Creatinine, Ser: 0.79 mg/dL (ref 0.44–1.00)
GFR calc Af Amer: >60 mL/min (ref >60)
GFR calc non Af Amer: >60 mL/min (ref >60)
Glucose, Bld: 98 mg/dL (ref 70–99)
Potassium: 3 mmol/L — ABNORMAL LOW (ref 3.5–5.1)
Sodium: 142 mmol/L (ref 135–145)

## 2019-02-22 LAB — TYPE AND SCREEN
ABO/RH(D): O NEG
Antibody Screen: NEGATIVE

## 2019-02-22 LAB — ECHOCARDIOGRAM COMPLETE
Height: 65 in
Weight: 1764.8 oz

## 2019-02-22 LAB — HIV ANTIBODY (ROUTINE TESTING W REFLEX): HIV Screen 4th Generation wRfx: NONREACTIVE

## 2019-02-22 MED ORDER — ACETAMINOPHEN 650 MG RE SUPP: 650.0000 mg | Freq: Four times a day (QID) | RECTAL | Status: DC | PRN

## 2019-02-22 MED ORDER — VITAMIN C 500 MG PO TABS
250.0000 mg | ORAL_TABLET | Freq: Two times a day (BID) | ORAL | Status: DC
  Administered 2019-02-22 – 2019-02-23 (×3): 250 mg via ORAL
  Filled 2019-02-22 (×3): qty 1

## 2019-02-22 MED ORDER — MORPHINE SULFATE (PF) 2 MG/ML IV SOLN: 2.0000 mg | INTRAVENOUS | Status: DC | PRN

## 2019-02-22 MED ORDER — POTASSIUM CHLORIDE CRYS ER 20 MEQ PO TBCR
40.0000 meq | EXTENDED_RELEASE_TABLET | Freq: Two times a day (BID) | ORAL | Status: DC
  Administered 2019-02-22 – 2019-02-23 (×3): 40 meq via ORAL
  Filled 2019-02-22 (×2): qty 2

## 2019-02-22 MED ORDER — OCUVITE-LUTEIN PO CAPS
1.0000 | ORAL_CAPSULE | Freq: Every day | ORAL | Status: DC
  Administered 2019-02-22 – 2019-02-23 (×2): 1 via ORAL
  Filled 2019-02-22 (×2): qty 1

## 2019-02-22 MED ORDER — FERROUS SULFATE 325 (65 FE) MG PO TABS
325.0000 mg | ORAL_TABLET | Freq: Two times a day (BID) | ORAL | Status: DC
  Administered 2019-02-22 – 2019-02-23 (×2): 325 mg via ORAL
  Filled 2019-02-22 (×2): qty 1

## 2019-02-22 MED ORDER — MORPHINE SULFATE (PF) 2 MG/ML IV SOLN
2.0000 mg | INTRAVENOUS | Status: DC | PRN
  Administered 2019-02-23 – 2019-02-24 (×6): 2 mg via INTRAVENOUS
  Filled 2019-02-22 (×6): qty 1

## 2019-02-22 MED ORDER — ACETAMINOPHEN 325 MG PO TABS: 650.0000 mg | ORAL_TABLET | Freq: Four times a day (QID) | ORAL | Status: DC | PRN

## 2019-02-22 NOTE — Consult Note (Signed)
Consultation Note Date: 02/22/2019   Patient Name: [name]  DOB: Nov 01, 1950  MRN: [id number]  Age / Sex: 68 y.o., female  PCP: Valerie Roys, DO Referring Physician: Vaughan Basta, *  Reason for Consultation: Establishing goals of care  HPI/Patient Profile:   The patient was recently discharged from hospice care since she is not qualified for hospice care anymore.  She complains of worsening shortness of breath and cough this morning.  She also complains of worsening bilateral leg edema.   Clinical Assessment and Goals of Care: Patient is resting in bed. She lives alone at home. She states "I have a son. He lives in [place], so pretty much no, I don't have a son." She was under [facility] hospice for 1 year that ended in September. She states her PCP is attempting to wean her off Morphine. Her SOB has increased.     We discussed her diagnosis, prognosis, GOC, EOL wishes disposition and options. Discussed limitations of medical interventions to prolong quality of life in some situations and discussed the concept of human mortality.  She tells me she is here at the hospital, because if she can not be under hospice care, she wants to do everything she can life sustaining so she can feel better, as she feels badly. She does not want iron infusions any longer as they cause her to feel sick, but may want blood transfusions. Dr. Fletcher Anon in to bedside to speak with patient. Discussed her good cardiac output in the context of the other medical issues. We discussed together home with hospice, as the interventions in the hospital will not improve quality of life.   A detailed discussion was had today regarding advanced directives.  Concepts specific to code status, artifical feeding and hydration, IV antibiotics and rehospitalization were discussed.  The difference between an aggressive medical intervention  path and a comfort care path was discussed.  Values and goals of care important to patient and family were attempted to be elicited.  She states she would like to have oral abx at home, but is not interested in returning to the hospital, or other life prolonging measures. She states she wants a peaceful exit from the world.  I completed a MOST form today and the signed original was placed in the chart. A photocopy was placed in the chart to be scanned into EMR. The patient outlined their wishes for the following treatment decisions:  Cardiopulmonary Resuscitation: Do Not Attempt Resuscitation (DNR/No CPR)  Medical Interventions: Comfort Measures: Keep clean, warm, and dry. Use medication by any route, positioning, wound care, and other measures to relieve pain and suffering. Use oxygen, suction and manual treatment of airway obstruction as needed for comfort. Do not transfer to the hospital unless comfort needs cannot be met in current location.  Antibiotics: Antibiotics if indicated  IV Fluids: No IV fluids (provide other measures to ensure comfort)  Feeding Tube: No feeding tube       SUMMARY OF RECOMMENDATIONS   Recommend hospice at home as she  states she will have help at home. She dos not want [facility].    Code Status/Advance Care Planning:  DNR    Symptom Management:   Oxycodone for pain  Morphine for pain and dyspnea.   Prognosis:   < 3 months PNA, pulmonary edema, electrolyte imbalance, albumin 2.8, HGB 7.3.       Primary Diagnoses: Present on Admission: . Acute on chronic systolic CHF (congestive heart failure) ([place])   I have reviewed the medical record, interviewed the patient and family, and examined the patient. The following aspects are pertinent.  Past Medical History:  Diagnosis Date  . Arthritis    right hip; back  . Chronic back pain   . Chronic right hip pain   . Chronic systolic CHF (congestive heart failure) ([place])    a. 02/2014 Echo: EF 20-25%,  severe MR, tricuspid regurg, mod dilated LA & RA; b. TTE 09/2015: EF 60-65%, no RWMA, normal LV dias fxn, mild MR, mildly dilated LA, RVSF nl, PASP nl  . Collagen vascular disease ([place])   . Coronary artery disease, non-occlusive    a. 02/24/2014: Lexiscan Myoview: no sig ischemia, On attenuation corrected images small mild perfusion defect in the apical & distal anterior wall w/ possible small mild ischemia. Mod global HK. EF 35%. Overall, moderate risk study.  b. cath 03/17/2014 with no sig CAD  . Depression   . GERD (gastroesophageal reflux disease)   . History of blood transfusion >50 times   "had blood transfusion; never found out what"  . HTN (hypertension)   . Hyperlipidemia   . Hypothyroidism    a. 02/2014 TSH 96.4.  . Iron deficiency anemia    a. requiring iron infusions  . NICM (nonischemic cardiomyopathy) ([place])    a. 2008 Echo: EF 20% [facility]);  b. 2011 Echo: EF 50-55% ([facility]);  c. 02/2014 Echo: EF 20-25%, mod dil LA/RA, severe MR, mod-sev TR. d. cath 03/15/2014: minor lumenal irregs EF 20%  . On home oxygen therapy    prn  . PAD (peripheral artery disease) ([place]) 04/06/2014   Successful self-expanding stent placement to the left external iliac artery and right common iliac arteries, med rx for L SFA  dz.  . Pneumonia "several times"  . PUD (peptic ulcer disease)   . Spinal stenosis    Social History   Socioeconomic History  . Marital status: Divorced    Spouse name: Not on file  . Number of children: Not on file  . Years of education: Not on file  . Highest education level: Not on file  Occupational History  . Not on file  Social Needs  . Financial resource strain: Not on file  . Food insecurity    Worry: Not on file    Inability: Not on file  . Transportation needs    Medical: Not on file    Non-medical: Not on file  Tobacco Use  . Smoking status: Current Every Day Smoker    Packs/day: 1.00    Years: 44.00    Pack years: 44.00    Types: Cigarettes  . Smokeless  tobacco: Never Used  Substance and Sexual Activity  . Alcohol use: Yes    Comment: occ  . Drug use: No  . Sexual activity: Never  Lifestyle  . Physical activity    Days per week: Not on file    Minutes per session: Not on file  . Stress: Not on file  Relationships  . Social Herbalist on phone:  Not on file    Gets together: Not on file    Attends religious service: Not on file    Active member of club or organization: Not on file    Attends meetings of clubs or organizations: Not on file    Relationship status: Not on file  Other Topics Concern  . Not on file  Social History Narrative   Lives in [place] by herself.  Does not routinely exercise.  Sometimes uses cane to ambulate.   Family History  Problem Relation Age of Onset  . Lung cancer Mother        deceased.  [name] CAD Father        CABG @ 23, alive @ 56.  . Breast cancer Sister   . Liver cancer Sister    Scheduled Meds: . azithromycin  500 mg Oral Daily  . enoxaparin (LOVENOX) injection  40 mg Subcutaneous Q24H  . ferrous sulfate  325 mg Oral BID WC  . furosemide  40 mg Intravenous Q12H  . levothyroxine  75 mcg Oral Q0600  . lisinopril  5 mg Oral Daily  . multivitamin-lutein  1 capsule Oral Daily  . nicotine  14 mg Transdermal Daily  . potassium chloride  40 mEq Oral BID  . sodium chloride flush  3 mL Intravenous Q12H  . vitamin C  250 mg Oral BID   Continuous Infusions: . sodium chloride    . cefTRIAXone (ROCEPHIN)  IV 1 g (02/22/19 1030)   PRN Meds:.sodium chloride, acetaminophen **OR** acetaminophen, albuterol, bisacodyl, guaiFENesin, morphine injection, ondansetron **OR** ondansetron (ZOFRAN) IV, oxyCODONE, senna-docusate, sodium chloride flush Medications Prior to Admission:  Prior to Admission medications   Medication Sig Start Date End Date Taking? Authorizing Provider  Aspirin-Salicylamide-Caffeine (BC HEADACHE POWDER PO) Take by mouth daily.   Yes [provider]  furosemide  (LASIX) 20 MG tablet Take by mouth as needed.  11/18/18  Yes [provider]  levothyroxine (SYNTHROID) 75 MCG tablet TAKE 1 TABLET BY MOUTH ONCE DAILY BEFOREBREAKFAST 02/09/19  Yes Johnson, Megan P, DO  LORazepam (ATIVAN) 0.5 MG tablet Take 0.5 mg by mouth every 6 (six) hours as needed for anxiety.   Yes [provider]  omeprazole (PRILOSEC) 20 MG capsule Take 1 capsule (20 mg total) by mouth daily. Patient taking differently: Take 20 mg by mouth daily as needed.  10/09/17  Yes Johnson, Megan P, DO  oxyCODONE (OXY IR/ROXICODONE) 5 MG immediate release tablet Take 1 tablet (5 mg total) by mouth every 4 (four) hours as needed for up to 7 days for severe pain. To be taken with '20mg'$  for '25mg'$  02/19/19 02/26/19 Yes Johnson, Megan P, DO  oxycodone 20 MG TABS Take 1 tablet (20 mg total) by mouth every 4 (four) hours as needed for up to 7 days for pain. To be taken with the '5mg'$  for '25mg'$  total 02/19/19 02/26/19 Yes Johnson, Megan P, DO  potassium chloride (K-DUR) 10 MEQ tablet Take 1 tablet (10 mEq total) by mouth 2 (two) times daily. Patient taking differently: Take 10 mEq by mouth as needed.  06/27/17 12/12/22 Yes Dunn, Areta Haber, PA-C   Allergies  Allergen Reactions  . Codeine Hives  . Macrobid [Nitrofurantoin Monohyd Macro] Diarrhea   Review of Systems  Constitutional: Positive for activity change.  Respiratory: Positive for shortness of breath.     Physical Exam Pulmonary:     Effort: Pulmonary effort is normal.  Neurological:     Mental Status: She is alert.  Vital Signs: BP (!) 105/49 (BP Location: Right Arm)   Pulse 89   Temp 98.4 F (36.9 C) (Oral)   Resp 18   Ht '5\' 5"'$  (1.651 m)   Wt 50 kg   SpO2 94%   BMI 18.35 kg/m  Pain Scale: 0-10   Pain Score: Asleep   SpO2: SpO2: 94 % O2 Device:SpO2: 94 % O2 Flow Rate: .O2 Flow Rate (L/min): 2 L/min  IO: Intake/output summary:   Intake/Output Summary (Last 24 hours) at 02/22/2019 1503 Last data filed at 02/22/2019  1300 Gross per 24 hour  Intake 1080 ml  Output 1700 ml  Net -620 ml    LBM: Last BM Date: 02/21/19 Baseline Weight: Weight: 49.9 kg Most recent weight: Weight: 50 kg     Palliative Assessment/Data: 30%     Time In: 2:25 Time Out: 3:15 Time Total: 50 min Greater than 50%  of this time was spent counseling and coordinating care related to the above assessment and plan.  Signed by: Asencion Gowda, NP   Please contact Palliative Medicine Team phone at [phone] for questions and concerns.  For individual provider: See Shea Evans

## 2019-02-22 NOTE — Progress Notes (Signed)
[place] [facility] [place] NAME: [name]    MR#:  [id number]  DATE OF BIRTH:  12/31/1950  SUBJECTIVE:  CHIEF COMPLAINT:   Chief Complaint  Patient presents with  . Shortness of Breath   Patient came with worsening shortness of breath.  Noted to have some pulmonary edema and possibly some infection or pneumonia.  Overall she does not feel good.  She said that she was on hospice services at home but they had just recently discharged her.  She feels she is not able to take care of anything at home.  Appears very frail.  REVIEW OF SYSTEMS:  CONSTITUTIONAL: No fever, have fatigue or weakness.  EYES: No blurred or double vision.  EARS, NOSE, AND THROAT: No tinnitus or ear pain.  RESPIRATORY: No cough, have shortness of breath, wheezing, no hemoptysis.  CARDIOVASCULAR: No chest pain, orthopnea, edema.  GASTROINTESTINAL: No nausea, vomiting, diarrhea or abdominal pain.  GENITOURINARY: No dysuria, hematuria.  ENDOCRINE: No polyuria, nocturia,  HEMATOLOGY: No anemia, easy bruising or bleeding SKIN: No rash or lesion. MUSCULOSKELETAL: No joint pain or arthritis.   NEUROLOGIC: No tingling, numbness, weakness.  PSYCHIATRY: No anxiety or depression.   ROS  DRUG ALLERGIES:   Allergies  Allergen Reactions  . Codeine Hives  . Macrobid [Nitrofurantoin Monohyd Macro] Diarrhea    VITALS:  Blood pressure (!) 105/49, pulse 89, temperature 98.4 F (36.9 C), temperature source Oral, resp. rate 18, height 5\' 5"  (1.651 m), weight 50 kg, SpO2 94 %.  PHYSICAL EXAMINATION:  GENERAL:  68 y.o.-year-old very thin and malnourished patient lying in the bed with no acute distress.  EYES: Pupils equal, round, reactive to light and accommodation. No scleral icterus. Extraocular muscles intact.  HEENT: Head atraumatic, normocephalic. Oropharynx and nasopharynx clear.  NECK:  Supple, no jugular venous distention. No thyroid enlargement, no tenderness.  LUNGS:  Normal breath sounds bilaterally, no wheezing, bilateral crepitation. No use of accessory muscles of respiration.  CARDIOVASCULAR: S1, S2 normal. No murmurs, rubs, or gallops.  ABDOMEN: Soft, nontender, nondistended. Bowel sounds present. No organomegaly or mass.  EXTREMITIES: No pedal edema, cyanosis, or clubbing.  NEUROLOGIC: Cranial nerves II through XII are intact. Muscle strength 3-4/5 in all extremities. Sensation intact. Gait not checked.  PSYCHIATRIC: The patient is alert and oriented x 3.  SKIN: No obvious rash, lesion, or ulcer.   Physical Exam LABORATORY PANEL:   CBC Recent Labs  Lab 02/22/19 0452  WBC 6.9  HGB 7.3*  HCT 25.8*  PLT 391   ------------------------------------------------------------------------------------------------------------------  Chemistries  Recent Labs  Lab 02/21/19 1453 02/22/19 0452  NA 139 142  K 3.5 3.0*  CL 98 97*  CO2 29 36*  GLUCOSE 78 98  BUN 14 13  CREATININE 0.77 0.79  CALCIUM 8.1* 7.6*  AST 181*  --   ALT 146*  --   ALKPHOS 118  --   BILITOT 0.4  --    ------------------------------------------------------------------------------------------------------------------  Cardiac Enzymes No results for input(s): TROPONINI in the last 168 hours. ------------------------------------------------------------------------------------------------------------------  RADIOLOGY:  Dg Chest 2 View  Result Date: 02/22/2019 CLINICAL DATA:  Congestive heart failure. EXAM: CHEST - 2 VIEW COMPARISON:  02/21/2019 FINDINGS: Cardiomegaly and aortic atherosclerosis as seen previously. Slight worsening of left effusion and left lower lobe atelectasis and or pneumonia. The lungs are otherwise well aerated. Mild interstitial edema evident consistent with early congestive heart failure. IMPRESSION: 1. Slight worsening of left effusion and left lower lobe atelectasis and/or pneumonia. 2. Mild  interstitial edema consistent with early congestive heart  failure. Electronically Signed   By: Nelson Chimes M.D.   On: 02/22/2019 09:40   Dg Chest Port 1 View  Result Date: 02/21/2019 CLINICAL DATA:  Acute shortness of breath and chest pain. EXAM: PORTABLE CHEST 1 VIEW COMPARISON:  10/31/2016 chest radiograph FINDINGS: The cardiomediastinal silhouette is unremarkable. New LEFT LOWER lung airspace disease/opacity noted likely representing pneumonia. A trace LEFT pleural effusion may be present. No pneumothorax. No evidence of acute bony abnormality. IMPRESSION: 1. New LEFT LOWER lung airspace disease/opacity likely representing pneumonia. Radiographic follow-up to resolution is recommended. 2. Possible trace LEFT pleural effusion. Electronically Signed   By: Margarette Canada M.D.   On: 02/21/2019 15:31    ASSESSMENT AND PLAN:   Active Problems:   Acute on chronic systolic CHF (congestive heart failure) (HCC)  Acute on chronic systolic CHF.  CHF protocol, Lasix IV every 12 hours, cardiology consult from Dr. Harrell Gave. Repeat echocardiogram, intake and output measurement.  Pneumonia, CAP. Continue Zithromax and Rocephin, follow-up CBC and cultures. Her white cell count is not high.  Chest x-ray shows worsening in the infiltrate and fluid.  Procalcitonin is not high. COVID-19 is negative. I will give total 3 to 5 days of antibiotic course.  Hypertension.  Continue Lasix and lisinopril. Hyperlipidemia Anemia of chronic disease.  Stable.  I will give iron and vitamin C. Tobacco abuse.  Smoking cessation was counseled of 3 to 4 minutes, nicotine patch.    All the records are reviewed and case discussed with Care Management/Social Workerr. Management plans discussed with the patient, family and they are in agreement.  CODE STATUS: Full code.  TOTAL TIME TAKING CARE OF THIS PATIENT: 35 minutes.     POSSIBLE D/C IN 1-2 DAYS, DEPENDING ON CLINICAL CONDITION.   Vaughan Basta M.D on 02/22/2019   Between 7am to 6pm - Pager -  [phone]  After 6pm go to [email or web address] - password [email or web address] [facility]  Office  [phone]  CC: Primary care physician; Valerie Roys, DO  Note: This dictation was prepared with Dragon dictation along with smaller phrase technology. Any transcriptional errors that result from this process are unintentional.

## 2019-02-22 NOTE — Telephone Encounter (Signed)
Copied from [place] [id number]. Topic: General - Inquiry >> Feb 22, 2019 10:52 AM Richardo Priest, NT wrote: Reason for CRM: Patient's sister called in to inform office that patient is currently in hospital due to having fluid in lungs. Please advise.

## 2019-02-22 NOTE — Progress Notes (Signed)
*  PRELIMINARY RESULTS* Echocardiogram 2D Echocardiogram has been performed.  [name] 02/22/2019, 11:52 AM

## 2019-02-22 NOTE — Telephone Encounter (Signed)
Called and let patient's sister know what Dr. Wynetta Emery said.

## 2019-02-22 NOTE — TOC Initial Note (Signed)
Transition of Care [facility]) - Initial/Assessment Note    Patient Details  Name: [name] MRN: [id number] Date of Birth: April 22, 1951  [facility] CM/SW Contact:    Elza Rafter, RN Phone Number: 02/22/2019, 10:36 AM  Clinical Narrative:   Patient is from home alone.  Sister lives close by.  Admitted with SOB; hx CHF-BNP 508.  Presented with SOB.  Lasix IV BID and IV antibiotics for PNA.  Patient states she was active with [facility] at home and was "fired" after a year in September.  She states they took her bed, oxygen etc.  Her PCP ordered home health.  Open to [facility] for home health.  Notified [place].    Current with [facility] for PCP; obtains medications at [facility] without difficulty-they deliver.  She uses a walker full time at home.  Currently no oxygen in the home.  Saturations are 94% on 2L.  Pending palliative consult.  Patient is agreeable to home with hospice or home health.   She states, "If I'm going to die I want hospice; if not I want home health".    Will place PT evaluation.  Has a functioning scale but has not been weighing; she verbalizes understanding of importance of daily weights.   Will continue to follow and assist with discharge planning.               Patient Goals and CMS Choice Patient states their goals for this hospitalization and ongoing recovery are:: wants to either go home with hospice or home health-depending on what the doctor's tell her.      Expected Discharge Plan and Services     Discharge Planning Services: CM Consult   Living arrangements for the past 2 months: [place]                                      Prior Living Arrangements/Services Living arrangements for the past 2 months: Single Family Home Lives with:: Self                Criminal Activity/Legal Involvement Pertinent to Current Situation/Hospitalization: No - Comment as needed  Activities of Daily Living Home  Assistive Devices/Equipment: Environmental consultant (specify type)(rolling) ADL Screening (condition at time of admission) Patient's cognitive ability adequate to safely complete daily activities?: Yes Is the patient deaf or have difficulty hearing?: Yes Does the patient have difficulty seeing, even when wearing glasses/contacts?: No Does the patient have difficulty concentrating, remembering, or making decisions?: Yes Patient able to express need for assistance with ADLs?: Yes Does the patient have difficulty dressing or bathing?: Yes Independently performs ADLs?: No Communication: Independent Dressing (OT): Needs assistance Is this a change from baseline?: Pre-admission baseline Grooming: Needs assistance Is this a change from baseline?: Pre-admission baseline Feeding: Independent Bathing: Needs assistance Is this a change from baseline?: Pre-admission baseline Toileting: Needs assistance Is this a change from baseline?: Pre-admission baseline In/Out Bed: Needs assistance Is this a change from baseline?: Pre-admission baseline Walks in Home: Needs assistance Is this a change from baseline?: Pre-admission baseline Does the patient have difficulty walking or climbing stairs?: Yes Weakness of Legs: Both Weakness of Arms/Hands: Both  Permission Sought/Granted                  Emotional Assessment Appearance:: Appears stated age Attitude/Demeanor/Rapport: Gracious Affect (typically observed): Accepting Orientation: : Oriented to Self, Oriented  to Place, Oriented to  Time, Oriented to Situation Alcohol / Substance Use: Not Applicable Psych Involvement: No (comment)  Admission diagnosis:  Shortness of breath [R06.02] Acute on chronic systolic heart failure (HCC) [I50.23] Community acquired pneumonia of left lower lobe of lung [J18.9] Patient Active Problem List   Diagnosis Date Noted  . Acute on chronic systolic CHF (congestive heart failure) ([place]) 02/21/2019  . Chronic pain syndrome  02/12/2019  . Lymphedema of both lower extremities 02/07/2019  . DNR (do not resuscitate) 10/09/2017  . Advance directive discussed with patient 12/03/2016  . Vitamin D deficiency 06/07/2016  . RLS (restless legs syndrome) 12/28/2015  . Carotid stenosis 06/22/2015  . Iron deficiency anemia 06/09/2015  . Osteoarthritis of right hip 04/11/2015  . Carotid bruit 04/03/2015  . Anxiety 08/08/2014  . PAD (peripheral artery disease) ([place]) 04/07/2014  . Pill dysphagia 04/07/2014  . Claudication ([place]) 03/24/2014  . Acute on chronic diastolic CHF (congestive heart failure) ([place])   . HTN (hypertension)   . Hyperlipidemia   . Tobacco abuse   . Hypothyroidism   . Spinal stenosis   . Chronic back pain   . PUD (peptic ulcer disease)   . Coronary artery disease, non-occlusive 09/05/2013   PCP:  Valerie Roys, DO Pharmacy:   [facility] [place] [address] Phone: [phone] Fax: [phone]     Social Determinants of Health (SDOH) Interventions    Readmission Risk Interventions No flowsheet data found.

## 2019-02-22 NOTE — Telephone Encounter (Signed)
I'm aware. They will take good care of her in the hospital and I will see her when she gets out.
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
## 2019-02-23 ENCOUNTER — Ambulatory Visit: Payer: Self-pay | Admitting: *Deleted

## 2019-02-23 ENCOUNTER — Inpatient Hospital Stay: Payer: Medicare Other

## 2019-02-23 DIAGNOSIS — R0602 Shortness of breath: Secondary | ICD-10-CM

## 2019-02-23 DIAGNOSIS — G894 Chronic pain syndrome: Secondary | ICD-10-CM

## 2019-02-23 LAB — BASIC METABOLIC PANEL
Anion gap: 6 (ref 5–15)
BUN: 9 mg/dL (ref 8–23)
CO2: 36 mmol/L — ABNORMAL HIGH (ref 22–32)
Calcium: 7.6 mg/dL — ABNORMAL LOW (ref 8.9–10.3)
Chloride: 98 mmol/L (ref 98–111)
Creatinine, Ser: 0.76 mg/dL (ref 0.44–1.00)
GFR calc Af Amer: >60 mL/min (ref >60)
GFR calc non Af Amer: >60 mL/min (ref >60)
Glucose, Bld: 95 mg/dL (ref 70–99)
Potassium: 3.3 mmol/L — ABNORMAL LOW (ref 3.5–5.1)
Sodium: 140 mmol/L (ref 135–145)

## 2019-02-23 LAB — MAGNESIUM: Magnesium: 1.9 mg/dL (ref 1.7–2.4)

## 2019-02-23 LAB — PROCALCITONIN: Procalcitonin: <0.1 ng/mL

## 2019-02-23 MED ORDER — SODIUM CHLORIDE 0.9 % IV SOLN
3.0000 g | Freq: Four times a day (QID) | INTRAVENOUS | Status: DC
  Administered 2019-02-23 – 2019-02-24 (×3): 3 g via INTRAVENOUS
  Filled 2019-02-23 (×5): qty 8
  Filled 2019-02-23: qty 3

## 2019-02-23 MED ORDER — NEPRO/CARBSTEADY PO LIQD: 237.0000 mL | Freq: Two times a day (BID) | ORAL | Status: DC

## 2019-02-23 MED ORDER — HYDROCERIN EX CREA
TOPICAL_CREAM | Freq: Two times a day (BID) | CUTANEOUS | Status: DC
  Administered 2019-02-23 – 2019-02-24 (×2): via TOPICAL
  Administered 2019-02-24: 1 via TOPICAL
  Administered 2019-02-24 – 2019-02-25 (×2): via TOPICAL
  Administered 2019-02-26: 1 via TOPICAL
  Filled 2019-02-23: qty 113

## 2019-02-23 MED ORDER — AMOXICILLIN-POT CLAVULANATE 875-125 MG PO TABS: 1.0000 | ORAL_TABLET | Freq: Two times a day (BID) | ORAL | Status: DC

## 2019-02-23 NOTE — Consult Note (Signed)
Pharmacy Antibiotic Note  [name] is a 68 y.o. female admitted on 02/21/2019 with pneumonia.  Pharmacy has been consulted for Unasyn dosing. Hard time swallowing pills.   Plan: Unasyn 3 g q6H   Height: 5\' 5"  (165.1 cm) Weight: 110 lb 3.2 oz (50 kg) IBW/kg (Calculated) : 57  Temp (24hrs), Avg:98.1 F (36.7 C), Min:97.8 F (36.6 C), Max:98.3 F (36.8 C)  Recent Labs  Lab 02/21/19 1453 02/22/19 0452 02/23/19 0522  WBC 6.6 6.9  --   CREATININE 0.77 0.79 0.76    Estimated Creatinine Clearance: 52.4 mL/min (by C-G formula based on SCr of 0.76 mg/dL).    Allergies  Allergen Reactions  . Codeine Hives  . Macrobid [Nitrofurantoin Monohyd Macro] Diarrhea    Antimicrobials this admission: 10/18 ceftriaxone >> 10/20 10/18 Azithromycin >> 10/20 10/20 Unasyn >>   Dose adjustments this admission: None  Microbiology results: 10/18 BCx: pending    Thank you for allowing pharmacy to be a part of this patient's care.  Oswald Hillock, PharmD, BCPS 02/23/2019 3:27 PM

## 2019-02-23 NOTE — Progress Notes (Signed)
New referral for [facility] hospice services at home received from [facility].  Writer attempted to speak with patient, but she was too lethargic/drowsy to carry on a conversation. CMRN Jennifer aware. Discussed with hospital care team. Per staff RN Lavella Lemons, patient had difficulty swallowing this morning and has a pending SLP eval. Patient information sent to referral. Flo Shanks BSN, RN, [place] [phone]

## 2019-02-23 NOTE — Progress Notes (Signed)
Initial Nutrition Assessment  DOCUMENTATION CODES:   Underweight  INTERVENTION:   Nepro Shake po BID, each supplement provides 425 kcal and 19 grams protein  Magic cup TID with meals, each supplement provides 290 kcal and 9 grams of protein  MVI daily   NUTRITION DIAGNOSIS:   Increased nutrient needs related to chronic illness(CHF, wound healing) as evidenced by increased estimated needs.  GOAL:   Patient will meet greater than or equal to 90% of their needs  MONITOR:   PO intake, Supplement acceptance, Labs, Weight trends, Skin, I & O's  REASON FOR ASSESSMENT:   Other (Comment)(Low BMI)    ASSESSMENT:   68 y.o. female with a hx of NICM, HFpEF with previous ejection fraction 20 to 25% but improved 06/2017 to 55-60%, nonobstructive CAD by 2015 catheterization, HTN, HLD, PAD s/p stenting 2015, hypothyroidism, iron deficiency anemia, OA, current smoker, and GERD who is admitted with CHF and CAP  RD working remotely.  Suspect pt with poor appetite and oral intake at baseline as pt is underweight. Pt currently eating 45-65% of meals in hospital. Pt with difficulty swallowing today; SLP evaluation pending. RD will add supplements to help pt meet her estimated needs and support wound healing. RD will order Nepro as this is nectar thick. Can change to Ensure pending SLP evaluation. Per chart, pt appears fairly weight stable pta.   Pt at high risk for malnutrition but unable to diagnose at this time as NFPE cannot be performed.   Medications reviewed and include: augmentin, lovenox, ferrous sulfate, synthroid, ocuvite, nicotine, KCl, vitamin C  Labs reviewed: K 3.3(L), Mg 1.9 wnl Hgb 7.3(L), Hct 25.8(L), MCH 23.4(L), MCHC 28.3(L)  Unable to complete Nutrition-Focused physical exam at this time.   Diet Order:   Diet Order            Diet 2 gram sodium Room service appropriate? Yes; Fluid consistency: Thin  Diet effective now             EDUCATION NEEDS:   Not  appropriate for education at this time  Skin:  Skin Assessment: Reviewed RN Assessment(Stage II buttocks 1cm x 1cm x 0.1cm)  Last BM:  10/18  Height:   Ht Readings from Last 1 Encounters:  02/21/19 5\' 5"  (1.651 m)    Weight:   Wt Readings from Last 1 Encounters:  02/23/19 50 kg    Ideal Body Weight:  56.8 kg  BMI:  Body mass index is 18.34 kg/m.  Estimated Nutritional Needs:   Kcal:  1400-1600kcal/day  Protein:  75-85g/day  Fluid:  >1.3L/day  Koleen Distance MS, RD, LDN Pager #- [phone] Office#- [phone] After Hours Pager: [phone]

## 2019-02-23 NOTE — Progress Notes (Signed)
[place] [facility] [place] NAME: [name]    MR#:  [id number]  DATE OF BIRTH:  December 01, 1950  SUBJECTIVE:  CHIEF COMPLAINT:   Chief Complaint  Patient presents with  . Shortness of Breath   Patient came with worsening shortness of breath.  Noted to have some pulmonary edema and possibly some infection or pneumonia.  Overall she does not feel good.  She said that she was on hospice services at home but they had just recently discharged her.  She feels she is not able to take care of anything at home.  Appears very frail.  REVIEW OF SYSTEMS:  CONSTITUTIONAL: No fever, have fatigue or weakness.  EYES: No blurred or double vision.  EARS, NOSE, AND THROAT: No tinnitus or ear pain.  RESPIRATORY: No cough, have shortness of breath, wheezing, no hemoptysis.  CARDIOVASCULAR: No chest pain, orthopnea, edema.  GASTROINTESTINAL: No nausea, vomiting, diarrhea or abdominal pain.  GENITOURINARY: No dysuria, hematuria.  ENDOCRINE: No polyuria, nocturia,  HEMATOLOGY: No anemia, easy bruising or bleeding SKIN: No rash or lesion. MUSCULOSKELETAL: No joint pain or arthritis.   NEUROLOGIC: No tingling, numbness, weakness.  PSYCHIATRY: No anxiety or depression.   ROS  DRUG ALLERGIES:   Allergies  Allergen Reactions  . Codeine Hives  . Macrobid [Nitrofurantoin Monohyd Macro] Diarrhea    VITALS:  Blood pressure 104/64, pulse 85, temperature 98 F (36.7 C), temperature source Oral, resp. rate 18, height 5\' 5"  (1.651 m), weight 50 kg, SpO2 93 %.  PHYSICAL EXAMINATION:  GENERAL:  68 y.o.-year-old very thin and malnourished patient lying in the bed with no acute distress.  EYES: Pupils equal, round, reactive to light and accommodation. No scleral icterus. Extraocular muscles intact.  HEENT: Head atraumatic, normocephalic. Oropharynx and nasopharynx clear.hair loss  NECK:  Supple, no jugular venous distention. No thyroid enlargement, no tenderness.  LUNGS:  Normal breath sounds bilaterally, no wheezing, bilateral crepitation. No use of accessory muscles of respiration.  CARDIOVASCULAR: S1, S2 normal. No murmurs, rubs, or gallops.  ABDOMEN: Soft, nontender, nondistended. Bowel sounds present. No organomegaly or mass.  EXTREMITIES: No pedal edema, cyanosis, or clubbing.  NEUROLOGIC: Cranial nerves II through XII are intact. Muscle strength 3-4/5 in all extremities. Sensation intact. Gait not checked.  PSYCHIATRIC: The patient is alert and oriented x 3.  SKIN: No obvious rash, lesion, or ulcer.   Physical Exam LABORATORY PANEL:   CBC Recent Labs  Lab 02/22/19 0452  WBC 6.9  HGB 7.3*  HCT 25.8*  PLT 391   ------------------------------------------------------------------------------------------------------------------  Chemistries  Recent Labs  Lab 02/21/19 1453  02/23/19 0522  NA 139   < > 140  K 3.5   < > 3.3*  CL 98   < > 98  CO2 29   < > 36*  GLUCOSE 78   < > 95  BUN 14   < > 9  CREATININE 0.77   < > 0.76  CALCIUM 8.1*   < > 7.6*  MG  --   --  1.9  AST 181*  --   --   ALT 146*  --   --   ALKPHOS 118  --   --   BILITOT 0.4  --   --    < > = values in this interval not displayed.   ------------------------------------------------------------------------------------------------------------------  Cardiac Enzymes No results for input(s): TROPONINI in the last 168 hours. ------------------------------------------------------------------------------------------------------------------  RADIOLOGY:  Dg Chest 2 View  Result Date: 02/22/2019 CLINICAL DATA:  Congestive heart failure. EXAM: CHEST - 2 VIEW COMPARISON:  02/21/2019 FINDINGS: Cardiomegaly and aortic atherosclerosis as seen previously. Slight worsening of left effusion and left lower lobe atelectasis and or pneumonia. The lungs are otherwise well aerated. Mild interstitial edema evident consistent with early congestive heart failure. IMPRESSION: 1. Slight worsening of  left effusion and left lower lobe atelectasis and/or pneumonia. 2. Mild interstitial edema consistent with early congestive heart failure. Electronically Signed   By: Nelson Chimes M.D.   On: 02/22/2019 09:40   Dg Chest Port 1 View  Result Date: 02/21/2019 CLINICAL DATA:  Acute shortness of breath and chest pain. EXAM: PORTABLE CHEST 1 VIEW COMPARISON:  10/31/2016 chest radiograph FINDINGS: The cardiomediastinal silhouette is unremarkable. New LEFT LOWER lung airspace disease/opacity noted likely representing pneumonia. A trace LEFT pleural effusion may be present. No pneumothorax. No evidence of acute bony abnormality. IMPRESSION: 1. New LEFT LOWER lung airspace disease/opacity likely representing pneumonia. Radiographic follow-up to resolution is recommended. 2. Possible trace LEFT pleural effusion. Electronically Signed   By: Margarette Canada M.D.   On: 02/21/2019 15:31    ASSESSMENT AND PLAN:   Active Problems:   Acute on chronic systolic CHF (congestive heart failure) (HCC)  Acute on chronic systolic CHF.  CHF protocol, Lasix IV every 12 hours, cardiology consult from Dr. Harrell Gave. Repeat echocardiogram, intake and output measurement.  Pneumonia, appears having aspirations.. Initially given Zithromax and Rocephin, follow-up CBC and cultures. Her WBCs are not high and she does not have fever.  She had significant coughing spell after trying to swallow her pills today while I was in the room.  I will get swallow evaluation and change antibiotic to Augmentin. Her white cell count is not high.  Chest x-ray shows worsening in the infiltrate and fluid.  Procalcitonin is not high. COVID-19 is negative. I will give total 3 to 5 days of antibiotic course.  Hypertension.  Continue Lasix and lisinopril. Hyperlipidemia Anemia of chronic disease.  Stable.  I will give iron and vitamin C. Tobacco abuse.  Smoking cessation was counseled of 3 to 4 minutes, nicotine patch. Malnutrition-appears very  frail.  Patient has agreed for getting hospice services at home.  We will wait for some improvement in overall condition before discharge.  All the records are reviewed and case discussed with Care Management/Social Workerr. Management plans discussed with the patient, family and they are in agreement.  CODE STATUS: DNR.  TOTAL TIME TAKING CARE OF THIS PATIENT: 35 minutes.     POSSIBLE D/C IN 1-2 DAYS, DEPENDING ON CLINICAL CONDITION.   Vaughan Basta M.D on 02/23/2019   Between 7am to 6pm - Pager - [phone]  After 6pm go to [email or web address] - password [email or web address] [facility]  Office  [phone]  CC: Primary care physician; Valerie Roys, DO  Note: This dictation was prepared with Dragon dictation along with smaller phrase technology. Any transcriptional errors that result from this process are unintentional.

## 2019-02-23 NOTE — Progress Notes (Signed)
And is seen again, with poor strength to cough and secretions.  She has difficulty swallowing her pills.  She appears very frail and struggling to cough up her secretions. I have set her up and did some gentle tapping on her back to help loosen up the secretions.  Meanwhile I will continue supplemental oxygen. I will keep her n.p.o. and I have stopped all oral medications for now and will only continue necessary medications through IV. Swallow technician can see her tomorrow.

## 2019-02-23 NOTE — Chronic Care Management (AMB) (Signed)
Chronic Care Management   Follow Up Note   02/23/2019 Name: [name] MRN: [id number] DOB: 11-Oct-1950  Referred by: Valerie Roys, DO Reason for referral : Care Coordination (Hospice liaision)   [name] is a 68 y.o. year old female who is a primary care patient of Valerie Roys, DO. The CCM team was consulted for assistance with chronic disease management and care coordination needs.    Review of patient status, including review of consultants reports, relevant laboratory and other test results, and collaboration with appropriate care team members and the patient's provider was performed as part of comprehensive patient evaluation and provision of chronic care management services.    SDOH (Social Determinants of Health) screening performed today: None. See Care Plan for related entries.   Advanced Directives Status: N See Care Plan and Vynca application for related entries.  Facility-Administered Encounter Medications as of 02/23/2019  Medication  . 0.9 %  sodium chloride infusion  . albuterol (PROVENTIL) (2.5 MG/3ML) 0.083% nebulizer solution 2.5 mg  . Ampicillin-Sulbactam (UNASYN) 3 g in sodium chloride 0.9 % 100 mL IVPB  . bisacodyl (DULCOLAX) EC tablet 5 mg  . enoxaparin (LOVENOX) injection 40 mg  . hydrocerin (EUCERIN) cream  . morphine 2 MG/ML injection 2 mg  . nicotine (NICODERM CQ - dosed in mg/24 hours) patch 14 mg  . ondansetron (ZOFRAN) injection 4 mg  . sodium chloride flush (NS) 0.9 % injection 3 mL  . sodium chloride flush (NS) 0.9 % injection 3 mL   Outpatient Encounter Medications as of 02/23/2019  Medication Sig Note  . Aspirin-Salicylamide-Caffeine (BC HEADACHE POWDER PO) Take by mouth daily.   . furosemide (LASIX) 20 MG tablet Take by mouth as needed.    [name] levothyroxine (SYNTHROID) 75 MCG tablet TAKE 1 TABLET BY MOUTH ONCE DAILY BEFOREBREAKFAST   . LORazepam (ATIVAN) 0.5 MG tablet Take 0.5 mg by mouth every 6 (six) hours as needed for  anxiety.   [name] omeprazole (PRILOSEC) 20 MG capsule Take 1 capsule (20 mg total) by mouth daily. (Patient taking differently: Take 20 mg by mouth daily as needed. ) 02/09/2019: As needed  . oxyCODONE (OXY IR/ROXICODONE) 5 MG immediate release tablet Take 1 tablet (5 mg total) by mouth every 4 (four) hours as needed for up to 7 days for severe pain. To be taken with 20mg  for 25mg    . oxycodone 20 MG TABS Take 1 tablet (20 mg total) by mouth every 4 (four) hours as needed for up to 7 days for pain. To be taken with the 5mg  for 25mg  total   . potassium chloride (K-DUR) 10 MEQ tablet Take 1 tablet (10 mEq total) by mouth 2 (two) times daily. (Patient taking differently: Take 10 mEq by mouth as needed. )      Goals Addressed            This Visit's Progress   . I am trying to manage my health (pt-stated)       Current Barriers:  [name] Knowledge deficit related to basic heart failure pathophysiology and self care management . Knowledge Deficits related to heart failure medications . Financial strain  Case Manager Clinical Goal(s):  [name] Over the next 90 days, patient will verbalize understanding of Heart Failure Action Plan and when to call doctor  Interventions:  . Reviewed role of diuretics in prevention of fluid overload and management of heart failure . Collaboration with [facility] Liaison about patient's current goals. Patient admitted presently. She  is being evaluated for Hospice admission.  . Made liaison aware patient's main support is her sister. She plans to f/u with her also.    Patient Self Care Activities:  . Takes Heart Failure Medications as prescribed  Initial goal documentation          The patient has been provided with contact information for the care management team and has been advised to call with any health related questions or concerns.    Merlene Morse Jani Moronta RN, BSN Nurse Case Editor, commissioning [facility] Care Management  [phone]) Business Mobile

## 2019-02-23 NOTE — Plan of Care (Signed)
  Problem: Education: Goal: Knowledge of General Education information will improve Description: Including pain rating scale, medication(s)/side effects and non-pharmacologic comfort measures Outcome: Progressing   Problem: Health Behavior/Discharge Planning: Goal: Ability to manage health-related needs will improve Outcome: Progressing   Problem: Clinical Measurements: Goal: Ability to maintain clinical measurements within normal limits will improve Outcome: Progressing Goal: Will remain free from infection Outcome: Progressing Note: Remains afebrile, on IV antibiotics for pneumonia Goal: Cardiovascular complication will be avoided Outcome: Progressing   Problem: Activity: Goal: Risk for activity intolerance will decrease Outcome: Progressing   Problem: Clinical Measurements: Goal: Diagnostic test results will improve Outcome: Not Progressing Note: MG 1.9 this morning, H/H 7.3/26.8, potassium 3.3 Goal: Respiratory complications will improve Outcome: Not Progressing

## 2019-02-23 NOTE — TOC Progression Note (Signed)
Transition of Care [facility]) - Progression Note    Patient Details  Name: [name] MRN: [id number] Date of Birth: 1950/12/27  Transition of Care [facility]) CM/SW Contact  Elza Rafter, RN Phone Number: 02/23/2019, 8:55 AM  Clinical Narrative:   Spoke with patient this morning; she would like [facility] for hospice at home.  She will need a hospital bed and O2.  [name] with [facility] notified.  Patient states her sister and friend will help her at home.           Expected Discharge Plan and Services     Discharge Planning Services: CM Consult   Living arrangements for the past 2 months: Single Family Home                                       Social Determinants of Health (SDOH) Interventions    Readmission Risk Interventions No flowsheet data found.

## 2019-02-23 NOTE — Progress Notes (Addendum)
Pt has repeatedly complained of severe pain to left leg, and states minimal relief from po Oxy, dosed x 2 tonight. States that her Ardelia Mems boot has been in place for a week now, and it should be changed today. Boot removed for skin assessment with pt's consent. Skin very dry and flaky with reddened areas over bony prominences. Pt experienced severe pain with removal of boot; medicated with IV morphine. Both legs very dry and flaky; gently cleansed with soap and water, moisturizing lotion applied. Left leg wrapped with Kerlix. Legs elevated on pillow to float heels.   Will place wound care consult for recommendations regarding wrapping leg as well as wound to right buttock. barrier cream was applied.

## 2019-02-23 NOTE — Progress Notes (Signed)
Follow up visit made, patient more alert and able to answer questions, but had an increased amount of clear phlegm she was trying to cough up. Cough very weak and in effective, she also stated she felt like she could not breathe and had "food stuck in her throat". Staff RN Tanya notified and Md in to assess. Patient did give permission for writer to contact her sister Lelan Pons in regards to DME needs and delivery. Patient also stated that her friend Kellie Shropshire would be staying "most nights with her". Will continue to follow. Flo Shanks BSN, RN, [place] [phone]

## 2019-02-23 NOTE — Consult Note (Signed)
[place] Nurse wound consult note Reason for Consult: Bilateral LEs with edema and pain with Unna's Boot. Stage 2 Pressure Injury to right buttock Wound type:Pressure Pressure Injury POA: Yes Measurement:Per Nursing Flow Sheet, 1cm x 1cm x 0.1cm with red, moist wound bed Wound bed:As described above Drainage (amount, consistency, odor) scant serous Periwound:intact Dressing procedure/placement/frequency:Nursing has previously initiated a silicone foam dressing to the right buttock Stage 2 Pressure Injury per their Skin Care bundle and I will continue that POC. It will be augmented by turning and repositioning from side to side while in bed and by the provision of a pressure redistribution chair cushion for her use while OOB to chair.   The bilateral LEs are without ulceration. Her edema can be managed with a dry boot, i.e., just below toe-to-just below knee application of a Kerlix roll gauze topped with an ACE bandage applied in a similar manner.  To avoid the occurrence of a pressure injury due to a medical device, I will have Nursing change twice daily after the provision of skin care to the LEs (which are noted to be dry and flaking in the Nurse Assessment) using Eucerin cream after washing with soap and water.  [facility] nursing team will not follow, but will remain available to this patient, the nursing and medical teams.  Please re-consult if needed. Thanks, Maudie Flakes, MSN, RN, [place], Arther Abbott  Pager# [phone]

## 2019-02-23 NOTE — Progress Notes (Signed)
Writer spoke on the telephone to patient's sister Blythe Stanford to discuss DME needs and DME delivery. Marie plans to be at [facility] tomorrow late afternoon to see Yodit and will meet with Probation officer at that time. Writer also spoke with patient's friend Kellie Shropshire.  Patient at this time is NPO due to swallowing difficulties today. Will continue to follow. Flo Shanks BSN, RN, [place] [facility] [phone]

## 2019-02-23 NOTE — Progress Notes (Signed)
Labs drawn and pt given po synthroid. States left leg feels much better now; very comfortable. Awake and alert.

## 2019-02-23 NOTE — Progress Notes (Signed)
Pt very sleepy, having difficulty swallowing, Dr. Anselm Jungling in room, will order speech evaluation for patient.

## 2019-02-23 NOTE — Telephone Encounter (Signed)
Jeneen Rinks calling to let us know pt had a missed visit.   [phone]

## 2019-02-24 LAB — BASIC METABOLIC PANEL
Anion gap: 7 (ref 5–15)
BUN: 6 mg/dL — ABNORMAL LOW (ref 8–23)
CO2: 37 mmol/L — ABNORMAL HIGH (ref 22–32)
Calcium: 7.8 mg/dL — ABNORMAL LOW (ref 8.9–10.3)
Chloride: 101 mmol/L (ref 98–111)
Creatinine, Ser: 0.64 mg/dL (ref 0.44–1.00)
GFR calc Af Amer: >60 mL/min (ref >60)
GFR calc non Af Amer: >60 mL/min (ref >60)
Glucose, Bld: 81 mg/dL (ref 70–99)
Potassium: 3.5 mmol/L (ref 3.5–5.1)
Sodium: 145 mmol/L (ref 135–145)

## 2019-02-24 LAB — PROCALCITONIN: Procalcitonin: <0.1 ng/mL

## 2019-02-24 MED ORDER — MORPHINE SULFATE (PF) 4 MG/ML IV SOLN: 4.0000 mg | INTRAVENOUS | Status: DC | PRN

## 2019-02-24 MED ORDER — SODIUM CHLORIDE 0.9% FLUSH: 3.0000 mL | INTRAVENOUS | Status: DC | PRN

## 2019-02-24 MED ORDER — ACETAMINOPHEN 650 MG RE SUPP: 650.0000 mg | Freq: Four times a day (QID) | RECTAL | Status: DC | PRN

## 2019-02-24 MED ORDER — SCOPOLAMINE 1 MG/3DAYS TD PT72
1.0000 | MEDICATED_PATCH | TRANSDERMAL | Status: DC
  Administered 2019-02-24 – 2019-02-27 (×2): 1.5 mg via TRANSDERMAL
  Filled 2019-02-24 (×2): qty 1

## 2019-02-24 MED ORDER — ONDANSETRON HCL 4 MG/2ML IJ SOLN: 4.0000 mg | Freq: Four times a day (QID) | INTRAMUSCULAR | Status: DC | PRN

## 2019-02-24 MED ORDER — GLYCOPYRROLATE 0.2 MG/ML IJ SOLN
0.2000 mg | INTRAMUSCULAR | Status: DC | PRN
  Filled 2019-02-24: qty 1

## 2019-02-24 MED ORDER — POLYVINYL ALCOHOL 1.4 % OP SOLN: 1.0000 [drp] | Freq: Four times a day (QID) | OPHTHALMIC | Status: DC | PRN

## 2019-02-24 MED ORDER — SODIUM CHLORIDE 0.9% FLUSH
3.0000 mL | Freq: Two times a day (BID) | INTRAVENOUS | Status: DC
  Administered 2019-02-24 – 2019-02-28 (×5): 3 mL via INTRAVENOUS

## 2019-02-24 MED ORDER — ACETAMINOPHEN 325 MG PO TABS: 650.0000 mg | ORAL_TABLET | Freq: Four times a day (QID) | ORAL | Status: DC | PRN

## 2019-02-24 MED ORDER — BIOTENE DRY MOUTH MT LIQD: 15.0000 mL | OROMUCOSAL | Status: DC | PRN

## 2019-02-24 MED ORDER — ONDANSETRON 4 MG PO TBDP
4.0000 mg | ORAL_TABLET | Freq: Four times a day (QID) | ORAL | Status: DC | PRN
  Filled 2019-02-24: qty 1

## 2019-02-24 MED ORDER — MORPHINE SULFATE (PF) 4 MG/ML IV SOLN: 6.0000 mg | INTRAVENOUS | Status: DC | PRN

## 2019-02-24 MED ORDER — LORAZEPAM 2 MG/ML IJ SOLN
1.0000 mg | INTRAMUSCULAR | Status: DC | PRN
  Administered 2019-02-25 – 2019-02-27 (×2): 1 mg via INTRAVENOUS
  Filled 2019-02-24 (×2): qty 1

## 2019-02-24 MED ORDER — FUROSEMIDE 10 MG/ML IJ SOLN
20.0000 mg | Freq: Once | INTRAMUSCULAR | Status: AC
  Administered 2019-02-24: 20 mg via INTRAVENOUS
  Filled 2019-02-24: qty 2

## 2019-02-24 MED ORDER — HALOPERIDOL LACTATE 5 MG/ML IJ SOLN: 0.5000 mg | INTRAMUSCULAR | Status: DC | PRN

## 2019-02-24 MED ORDER — OXYCODONE HCL 20 MG/ML PO CONC: 20.0000 mg | ORAL | Status: DC | PRN

## 2019-02-24 MED ORDER — MORPHINE SULFATE (PF) 4 MG/ML IV SOLN
6.0000 mg | INTRAVENOUS | Status: DC | PRN
  Administered 2019-02-24 – 2019-02-28 (×13): 8 mg via INTRAVENOUS
  Administered 2019-02-28: 6 mg via INTRAVENOUS
  Filled 2019-02-24 (×16): qty 2

## 2019-02-24 NOTE — Progress Notes (Addendum)
Daily Progress Note   Patient Name: [name]       Date: 02/24/2019 DOB: 02-18-1951  Age: 68 y.o. MRN#: [id number] Attending Physician: Demetrios Loll, MD Primary Care Physician: Valerie Roys, DO Admit Date: 02/21/2019  Reason for Consultation/Follow-up: Establishing goals of care  Subjective: Patient is resting in bed. She has been placed on NPO status with SLP eval due to swallowing deficit. She failed swallow eval. She complains of pain and states she is hungry and thirsty, but cannot physically eat or drink, even for comfort. She has frothy sputum and increased O2 need, dose of Lasix ordered. She states since she is unable to eat and drink as it is not comfortable, she would like to change to full comfort care and stop antibiotics which was initially planned to continue. She states she just wants to see her family before she dies. Discussed transfer to hospice facility in place of home with hospice, and she is amenable. Morphine increased based on her pain which has increased since last palliative visit 2 days prior, and home oral Oxycodone dosing from previous hospice treatment, and most recent outpatient chronic pain clinic EMR notes.    I completed a new MOST form today and the signed original was placed in the chart. A photocopy was placed in the chart to be scanned into EMR. The patient outlined their wishes for the following treatment decisions:  Cardiopulmonary Resuscitation: Do Not Attempt Resuscitation (DNR/No CPR)  Medical Interventions: Comfort Measures: Keep clean, warm, and dry. Use medication by any route, positioning, wound care, and other measures to relieve pain and suffering. Use oxygen, suction and manual treatment of airway obstruction as needed for comfort. Do not  transfer to the hospital unless comfort needs cannot be met in current location.  Antibiotics: No antibiotics (use other measures to relieve symptoms)  IV Fluids: No IV fluids (provide other measures to ensure comfort)  Feeding Tube: No feeding tube     ADDENDUM: Spoke with patient and sister at bedside, along with hospice liasion. Questions answered Length of Stay: 3  Current Medications: Scheduled Meds:  . hydrocerin   Topical BID  . nicotine  14 mg Transdermal Daily  . sodium chloride flush  3 mL Intravenous Q12H  . sodium chloride flush  3 mL Intravenous Q12H    Continuous  Infusions: . sodium chloride      PRN Meds: sodium chloride, acetaminophen **OR** acetaminophen, albuterol, antiseptic oral rinse, bisacodyl, glycopyrrolate, haloperidol lactate, LORazepam, morphine injection, [DISCONTINUED] ondansetron **OR** ondansetron (ZOFRAN) IV, ondansetron **OR** ondansetron (ZOFRAN) IV, polyvinyl alcohol, sodium chloride flush, sodium chloride flush  Physical Exam Pulmonary:     Effort: Pulmonary effort is normal.  Skin:    General: Skin is warm and dry.  Neurological:     Mental Status: She is alert.             Vital Signs: BP 133/62 (BP Location: Right Arm)   Pulse 89   Temp (!) 97.5 F (36.4 C) (Oral)   Resp 19   Ht 5' 5" (1.651 m)   Wt 49 kg   SpO2 92%   BMI 17.98 kg/m  SpO2: SpO2: 92 % O2 Device: O2 Device: Nasal Cannula O2 Flow Rate: O2 Flow Rate (L/min): 4 L/min  Intake/output summary:   Intake/Output Summary (Last 24 hours) at 02/24/2019 1309 Last data filed at 02/24/2019 0914 Gross per 24 hour  Intake 3 ml  Output 200 ml  Net -197 ml   LBM: Last BM Date: 02/21/19 Baseline Weight: Weight: 49.9 kg Most recent weight: Weight: 49 kg       Palliative Assessment/Data: 20%      Patient Active Problem List   Diagnosis Date Noted  . Acute on chronic systolic CHF (congestive heart failure) (HCC) 02/21/2019  . Chronic pain syndrome 02/12/2019  .  Lymphedema of both lower extremities 02/07/2019  . DNR (do not resuscitate) 10/09/2017  . Advance directive discussed with patient 12/03/2016  . Vitamin D deficiency 06/07/2016  . RLS (restless legs syndrome) 12/28/2015  . Carotid stenosis 06/22/2015  . Iron deficiency anemia 06/09/2015  . Osteoarthritis of right hip 04/11/2015  . Carotid bruit 04/03/2015  . Anxiety 08/08/2014  . PAD (peripheral artery disease) (HCC) 04/07/2014  . Pill dysphagia 04/07/2014  . Claudication (HCC) 03/24/2014  . Acute on chronic diastolic CHF (congestive heart failure) (HCC)   . HTN (hypertension)   . Hyperlipidemia   . Tobacco abuse   . Hypothyroidism   . Spinal stenosis   . Chronic back pain   . PUD (peptic ulcer disease)   . 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   Full comfort care.  Morphine PRN for pain and dyspnea. Robinul PRN for secretions Ativan PRN for anxiety Haldol PRN for agitation     Code Status:    Code Status Orders  (From admission, onward)         Start     Ordered   02/24/19 1245  Do not attempt resuscitation (DNR)  Continuous    Question Answer Comment  In the event of cardiac or respiratory ARREST Do not call a "code blue"   In the event of cardiac or respiratory ARREST Do not perform Intubation, CPR, defibrillation or ACLS   In the event of cardiac or respiratory ARREST Use medication by any route, position, wound care, and other measures to relive pain and suffering. May use oxygen, suction and manual treatment of airway obstruction as needed for comfort.   Comments MOST form in chart      02/24/19 1250        Code Status History    Date Active Date Inactive Code Status Order ID Comments User Context   02/22/2019 1517 02/24/2019 1250 DNR [id number]  ,   [place], NP Inpatient   02/21/2019 1800 02/22/2019 1517 Full Code [id number]  Demetrios Loll,  MD Inpatient   09/05/2015 2247 09/07/2015 1705 Full Code [id number]  Harrie Foreman, MD Inpatient   04/06/2014 1514 04/10/2014 1658 Full Code [id number]  Wellington Hampshire, MD Inpatient   Advance Care Planning Activity       Prognosis:   < 2 weeks Extremely poor to no oral intake. High symptom management needs.   Discharge Planning:  Hospice facility  Care plan was discussed with hospice. RN.   Thank you for allowing the Palliative Medicine Team to assist in the care of this patient.   Time In: 12:25 Time Out: 1:30 Total Time 65 min Prolonged Time Billed yes       Greater than 50%  of this time was spent counseling and coordinating care related to the above assessment and plan.  Asencion Gowda, NP  Please contact Palliative Medicine Team phone at [phone] for questions and concerns.

## 2019-02-24 NOTE — Progress Notes (Signed)
[place] at [place] NAME: [name]    MR#:  [id number]  DATE OF BIRTH:  18-Jan-1951  SUBJECTIVE:  CHIEF COMPLAINT:   Chief Complaint  Patient presents with  . Shortness of Breath   Patient complains of cough, shortness of breath and body pain and wants more medication. REVIEW OF SYSTEMS:  CONSTITUTIONAL: No fever, have fatigue or weakness.  EYES: No blurred or double vision.  EARS, NOSE, AND THROAT: No tinnitus or ear pain.  RESPIRATORY: has cough and clear phlegm, have shortness of breath, wheezing, no hemoptysis.  CARDIOVASCULAR: No chest pain, orthopnea, edema.  GASTROINTESTINAL: No nausea, vomiting, diarrhea or abdominal pain.  GENITOURINARY: No dysuria, hematuria.  ENDOCRINE: No polyuria, nocturia,  HEMATOLOGY: No anemia, easy bruising or bleeding SKIN: No rash or lesion. MUSCULOSKELETAL: No joint pain or arthritis.   NEUROLOGIC: No tingling, numbness, weakness.  PSYCHIATRY: No anxiety or depression.   ROS  DRUG ALLERGIES:   Allergies  Allergen Reactions  . Codeine Hives  . Macrobid [Nitrofurantoin Monohyd Macro] Diarrhea    VITALS:  Blood pressure 133/62, pulse 89, temperature (!) 97.5 F (36.4 C), temperature source Oral, resp. rate 19, height 5\' 5"  (1.651 m), weight 49 kg, SpO2 92 %.  PHYSICAL EXAMINATION:  GENERAL:  68 y.o.-year-old very thin and malnourished patient lying in the bed with no acute distress.  Severe malnutrition and looks fragile. EYES: Pupils equal, round, reactive to light and accommodation. No scleral icterus. Extraocular muscles intact.  HEENT: Head atraumatic, normocephalic.  NECK:  Supple, no jugular venous distention. No thyroid enlargement, no tenderness.  LUNGS: Normal breath sounds bilaterally, no wheezing, bilateral crepitation. No use of accessory muscles of respiration.  CARDIOVASCULAR: S1, S2 normal. No murmurs, rubs, or gallops.  ABDOMEN: Soft, nontender, nondistended. Bowel sounds  present. No organomegaly or mass.  EXTREMITIES: No pedal edema, cyanosis, or clubbing.  NEUROLOGIC: Cranial nerves II through XII are intact. Muscle strength 3-4/5 in all extremities. Sensation intact. Gait not checked.  PSYCHIATRIC: The patient is alert and oriented x 3.  SKIN: No obvious rash, lesion, or ulcer.   Physical Exam LABORATORY PANEL:   CBC Recent Labs  Lab 02/22/19 0452  WBC 6.9  HGB 7.3*  HCT 25.8*  PLT 391   ------------------------------------------------------------------------------------------------------------------  Chemistries  Recent Labs  Lab 02/21/19 1453  02/23/19 0522 02/24/19 0440  NA 139   < > 140 145  K 3.5   < > 3.3* 3.5  CL 98   < > 98 101  CO2 29   < > 36* 37*  GLUCOSE 78   < > 95 81  BUN 14   < > 9 6*  CREATININE 0.77   < > 0.76 0.64  CALCIUM 8.1*   < > 7.6* 7.8*  MG  --   --  1.9  --   AST 181*  --   --   --   ALT 146*  --   --   --   ALKPHOS 118  --   --   --   BILITOT 0.4  --   --   --    < > = values in this interval not displayed.   ------------------------------------------------------------------------------------------------------------------  Cardiac Enzymes No results for input(s): TROPONINI in the last 168 hours. ------------------------------------------------------------------------------------------------------------------  RADIOLOGY:  Dg Chest 1 View  Result Date: 02/23/2019 CLINICAL DATA:  Aspiration pneumonia EXAM: CHEST  1 VIEW COMPARISON:  02/22/2019 FINDINGS: No significant interval change in AP portable examination with  a small to moderate left pleural effusion and associated atelectasis or consolidation. Unchanged mild, diffuse interstitial opacity, generally in keeping with edema. No new airspace opacity. Cardiomegaly. IMPRESSION: 1. No significant interval change in AP portable examination with a small to moderate left pleural effusion and associated atelectasis or consolidation. 2. Unchanged mild, diffuse  interstitial opacity, generally in keeping with edema. No new airspace opacity. 3.  Cardiomegaly. Electronically Signed   By: Eddie Candle M.D.   On: 02/23/2019 15:49    ASSESSMENT AND PLAN:   Active Problems:   Acute on chronic systolic CHF (congestive heart failure) (HCC)  Acute on chronic systolic CHF. The patient has been treated with Lasix IV Per Dr. Fletcher Anon, palliative care or hospice care.  Pneumonia, appears having aspirations. Initially given Zithromax and Rocephin, follow-up CBC and cultures. Her WBCs are not high and she does not have fever.  She had significant coughing spell after trying to swallow her pills today while I was in the room.  I will get swallow evaluation and change antibiotic to Augmentin. Her white cell count is not high.  Chest x-ray shows worsening in the infiltrate and fluid.  Procalcitonin is not high. COVID-19 is negative.  Hypertension.    She was treated with Lasix and lisinopril. Hyperlipidemia Anemia of chronic disease.  Stable.   Tobacco abuse.  Smoking cessation was counseled of 3 to 4 minutes, nicotine patch. Severe malnutrition-appears very frail. Dysphagia.  For speech study, given CVO due to high risk for aspiration. For palliative care team, full comfort care and hospice facility placement. All the records are reviewed and case discussed with Care Management/Social Workerr. Management plans discussed with the patient, family and they are in agreement.  CODE STATUS: DNR.  TOTAL TIME TAKING CARE OF THIS PATIENT: 33 minutes.  POSSIBLE D/C IN 1-2 DAYS, DEPENDING ON CLINICAL CONDITION.   Demetrios Loll M.D on 02/24/2019   Between 7am to 6pm - Pager - [phone]  After 6pm go to [email or web address] - password [email or web address] [facility]  Office  [phone]  CC: Primary care physician; Valerie Roys, DO  Note: This dictation was prepared with Dragon dictation along with smaller phrase technology. Any transcriptional errors that  result from this process are unintentional.

## 2019-02-24 NOTE — Progress Notes (Signed)
Follow up visit made this afternoon. Patient seen sitting up in bed, alert and interactive,  sister Lelan Pons and friend [name] present, Palliative NP present for part of the visit as well. Patient failed a swallow eval this morning and remains NPO. She continues with increased secretions and dyspnea, now requiring 4 liters of oxygen. She also complains of pain to her legs and shoulder. Patient lives alone and is requiring IV morphine for pain management, she has discussed the hospice home with palliative NP and family, all agree that this is a more appropriate plan at this time. Patient has chosen to stop all antibiotics and IV fluids as well as oral medications, she wants her care to be focused on comfort and did voice understanding that with out being able to eat or drink  her prognosis is very poor. Writer initiated education regarding hospice services, philosophy, team approach to care and current visitation policy with understanding voiced. Updated patient information faxed to referral. Patient, family and hospital care team all aware there is not currently a bed available. Will continue to follow through discharge. Flo Shanks BSN, RN, [facility] [phone]

## 2019-02-24 NOTE — TOC Progression Note (Signed)
Transition of Care [facility]) - Progression Note    Patient Details  Name: [name] MRN: [id number] Date of Birth: 05-05-1951  Transition of Care [facility]) CM/SW Contact  Elza Rafter, RN Phone Number: 02/24/2019, 1:26 PM  Clinical Narrative:   Notified by Donella Stade with palliative patient is now hospice home appropriate.  Notified Flo Shanks, RN with [facility].      Expected Discharge Plan: Home w Hospice Care Barriers to Discharge: Continued Medical Work up  Expected Discharge Plan and Services Expected Discharge Plan: [place] In-house Referral: [facility], Hospice / Palliative Care Discharge Planning Services: CM Consult   Living arrangements for the past 2 months: [place] Agency: [facility] Date [place]: 02/23/19 Time [place]: [phone] Representative spoke with at [place]: [place] Determinants of Health (SDOH) Interventions    Readmission Risk Interventions No flowsheet data found.

## 2019-02-24 NOTE — Care Management Important Message (Signed)
Important Message  Patient Details  Name: [name] MRN: [id number] Date of Birth: 1950/05/12   Medicare Important Message Given:  Yes  Initial Medicare IM given by Patient Access Associate on 02/23/2019 at 9:16am.   Dannette Barbara 02/24/2019, 8:11 AM
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
## 2019-02-25 ENCOUNTER — Inpatient Hospital Stay: Payer: Medicare Other | Attending: Oncology | Admitting: Oncology

## 2019-02-25 LAB — BASIC METABOLIC PANEL
Anion gap: 16 — ABNORMAL HIGH (ref 5–15)
BUN: 8 mg/dL (ref 8–23)
CO2: 35 mmol/L — ABNORMAL HIGH (ref 22–32)
Calcium: 7.9 mg/dL — ABNORMAL LOW (ref 8.9–10.3)
Chloride: 95 mmol/L — ABNORMAL LOW (ref 98–111)
Creatinine, Ser: 0.54 mg/dL (ref 0.44–1.00)
GFR calc Af Amer: >60 mL/min (ref >60)
GFR calc non Af Amer: >60 mL/min (ref >60)
Glucose, Bld: 53 mg/dL — ABNORMAL LOW (ref 70–99)
Potassium: 3.4 mmol/L — ABNORMAL LOW (ref 3.5–5.1)
Sodium: 146 mmol/L — ABNORMAL HIGH (ref 135–145)

## 2019-02-25 MED ORDER — MORPHINE SULFATE (PF) 2 MG/ML IV SOLN
INTRAVENOUS | Status: AC
  Administered 2019-02-25: 8 mg via INTRAVENOUS
  Filled 2019-02-25: qty 4

## 2019-02-25 MED ORDER — POTASSIUM CHLORIDE CRYS ER 20 MEQ PO TBCR: 40.0000 meq | EXTENDED_RELEASE_TABLET | Freq: Once | ORAL | Status: DC

## 2019-02-25 NOTE — Progress Notes (Signed)
Follow up visit made to new [facility] hospice home referral. Patient seen sitting up in bed, drowsy, but able to interact and eat a few ice chips. She has received 2 doses of IV morphine since midnight for a total of 16 mg. She also remains on 4 liters of oxygen, secretions noted to be improved from yesterday with scopolamine patch in place. Patient is requesting to have water, ice chips given. Will discuss comfort feeds with Palliative NP. Patient and hospital care team aware there are currently no beds available at the hospice home. Will continue to follow. Flo Shanks BSN, RN, [facility] [phone]

## 2019-02-25 NOTE — Plan of Care (Signed)
PMT note: Patient resting in bed with hospice liasion at bedside. She states last night was not good, but could not tell me why it was not good. Her secretions have improved. She is eating a small amount of ice chips. No changes to regimen at this time.   No charge.

## 2019-02-25 NOTE — Progress Notes (Signed)
[place] at [place] NAME: [name]    MR#:  [id number]  DATE OF BIRTH:  [birth year]  SUBJECTIVE:  CHIEF COMPLAINT:   Chief Complaint  Patient presents with  . Shortness of Breath   Patient is drowsy in the lethargy, on oxygen 4 L by nasal cannula.[name] REVIEW OF SYSTEMS:   ROS the patient is drowsy and lethargic, unable to get ROS.  DRUG ALLERGIES:   Allergies  Allergen Reactions  . Codeine Hives  . Macrobid [Nitrofurantoin Monohyd Macro] Diarrhea    VITALS:  Blood pressure (!) 109/50, pulse 83, temperature 97.9 F (36.6 C), resp. rate 16, height 5\' 5"  (1.651 m), weight 49 kg, SpO2 97 %.  PHYSICAL EXAMINATION:  GENERAL:  68 y.o.-year-old very thin and malnourished patient lying in the bed with no acute distress.  Severe malnutrition. EYES: Pupils equal, round, reactive to light and accommodation. No scleral icterus. Extraocular muscles intact.  HEENT: Head atraumatic, normocephalic.  NECK:  Supple, no jugular venous distention. No thyroid enlargement, no tenderness.  LUNGS: Normal breath sounds bilaterally, no wheezing, bilateral crepitation. No use of accessory muscles of respiration.  CARDIOVASCULAR: S1, S2 normal. No murmurs, rubs, or gallops.  ABDOMEN: Soft, nontender, nondistended. Bowel sounds present. No organomegaly or mass.  EXTREMITIES: No pedal edema, cyanosis, or clubbing.  NEUROLOGIC: The patient is drowsy, unable to exam. PSYCHIATRIC: The patient is alert and oriented x 1.  SKIN: No obvious rash, lesion, or ulcer.   Physical Exam LABORATORY PANEL:   CBC Recent Labs  Lab 02/22/19 0452  WBC 6.9  HGB 7.3*  HCT 25.8*  PLT 391   ------------------------------------------------------------------------------------------------------------------  Chemistries  Recent Labs  Lab 02/21/19 1453  02/23/19 0522  02/25/19 0559  NA 139   < > 140   < > 146*  K 3.5   < > 3.3*   < > 3.4*  CL 98   < > 98   < > 95*   CO2 29   < > 36*   < > 35*  GLUCOSE 78   < > 95   < > 53*  BUN 14   < > 9   < > 8  CREATININE 0.77   < > 0.76   < > 0.54  CALCIUM 8.1*   < > 7.6*   < > 7.9*  MG  --   --  1.9  --   --   AST 181*  --   --   --   --   ALT 146*  --   --   --   --   ALKPHOS 118  --   --   --   --   BILITOT 0.4  --   --   --   --    < > = values in this interval not displayed.   ------------------------------------------------------------------------------------------------------------------  Cardiac Enzymes No results for input(s): TROPONINI in the last 168 hours. ------------------------------------------------------------------------------------------------------------------  RADIOLOGY:  Dg Chest 1 View  Result Date: 02/23/2019 CLINICAL DATA:  Aspiration pneumonia EXAM: CHEST  1 VIEW COMPARISON:  02/22/2019 FINDINGS: No significant interval change in AP portable examination with a small to moderate left pleural effusion and associated atelectasis or consolidation. Unchanged mild, diffuse interstitial opacity, generally in keeping with edema. No new airspace opacity. Cardiomegaly. IMPRESSION: 1. No significant interval change in AP portable examination with a small to moderate left pleural effusion and associated atelectasis or consolidation. 2. Unchanged mild, diffuse interstitial opacity, generally in  keeping with edema. No new airspace opacity. 3.  Cardiomegaly. Electronically Signed   By: Eddie Candle M.D.   On: 02/23/2019 15:49    ASSESSMENT AND PLAN:   Active Problems:   Acute on chronic systolic CHF (congestive heart failure) (HCC)  Acute on chronic systolic CHF. The patient was treated with Lasix IV Per Dr. Fletcher Anon, palliative care or hospice care.  Pneumonia, appears having aspirations. Initially given Zithromax and Rocephin, follow-up CBC and cultures. Her WBCs are not high and she does not have fever.  She had significant coughing spell after trying to swallow her pills today while I was  in the room.  I will get swallow evaluation and change antibiotic to Augmentin. Her white cell count is not high.  Chest x-ray shows worsening in the infiltrate and fluid.  Procalcitonin is not high. COVID-19 is negative.  Hypertension.    She was treated with Lasix and lisinopril. Hyperlipidemia Anemia of chronic disease.  Stable.   Tobacco abuse.  Smoking cessation was counseled of 3 to 4 minutes, nicotine patch. Severe malnutrition-appears very frail. Dysphagia.  For speech study, given CVO due to high risk for aspiration. For palliative care team, full comfort care and hospice facility placement. Still no bed is available. All the records are reviewed and case discussed with Care Management/Social Workerr. Management plans discussed with the patient, family and they are in agreement.  CODE STATUS: DNR.  TOTAL TIME TAKING CARE OF THIS PATIENT: 25 minutes.  POSSIBLE D/C IN 1-2 DAYS, DEPENDING ON CLINICAL CONDITION.   Demetrios Loll M.D on 02/25/2019   Between 7am to 6pm - Pager - [phone]  After 6pm go to [email or web address] - password [email or web address] [place] [facility]  Office  [phone]  CC: Primary care physician; Valerie Roys, DO  Note: This dictation was prepared with Dragon dictation along with smaller phrase technology. Any transcriptional errors that result from this process are unintentional.

## 2019-02-26 LAB — CULTURE, BLOOD (ROUTINE X 2)
Culture: NO GROWTH
Culture: NO GROWTH

## 2019-02-26 NOTE — Progress Notes (Signed)
Pt refused wound care due to pain level. PRN pain medication given. Will continue to monitor.

## 2019-02-26 NOTE — Progress Notes (Addendum)
Visit made.. Patient seen sitting up in bed, continues to appear weak, with increased confusion. Eating ice chips, loose cough noted with weak cough effort. She had just received 8 mg of IV morphine for pain. Emotional support provided. Staff RN Ria Comment in during visit with lemon glycerin mouth swabs, patient appeared to enjoy these. Hospital care team and patient made aware there are currently no beds available at the hospice home today.  Hospice home staff will contact weekend TOC  if bed becomes available. Hospice home staff will be provided with the [facility]  weekend [place] phone numbers. Will continue to follow. Flo Shanks BSN, RN, [facility] [phone]

## 2019-02-26 NOTE — Progress Notes (Signed)
[facility] NAME: [name]    MR#:  [id number]  DATE OF BIRTH:  1951/04/23  SUBJECTIVE:  CHIEF COMPLAINT:   Chief Complaint  Patient presents with  . Shortness of Breath   Patient is drowsy in the lethargy, on oxygen 4 L by nasal cannula.[name] REVIEW OF SYSTEMS:   ROS the patient is drowsy and lethargic, unable to get ROS.  DRUG ALLERGIES:   Allergies  Allergen Reactions  . Codeine Hives  . Macrobid [Nitrofurantoin Monohyd Macro] Diarrhea    VITALS:  Blood pressure (!) 106/54, pulse 87, temperature 98.6 F (37 C), temperature source Oral, resp. rate 19, height 5\' 5"  (1.651 m), weight 50 kg, SpO2 98 %.  PHYSICAL EXAMINATION:  GENERAL:  68 y.o.-year-old very thin and malnourished patient lying in the bed with no acute distress.  Severe malnutrition. EYES: PNo scleral icterus. Extraocular muscles intact.  HEENT: Head atraumatic, normocephalic.  NECK:  Supple, no jugular venous distention  LUNGS: Normal breath sounds bilaterally, no wheezing, bilateral crepitation. No use of accessory muscles of respiration.  CARDIOVASCULAR: S1, S2 normal. No murmurs, rubs, or gallops.  ABDOMEN: Soft, nontender, nondistended.  EXTREMITIES: No pedal edema, cyanosis, or clubbing.  NEUROLOGIC: The patient is drowsy, unable to exam. PSYCHIATRIC: The patient is drowsy. SKIN: No obvious rash, lesion, or ulcer.   Physical Exam LABORATORY PANEL:   CBC Recent Labs  Lab 02/22/19 0452  WBC 6.9  HGB 7.3*  HCT 25.8*  PLT 391   ------------------------------------------------------------------------------------------------------------------  Chemistries  Recent Labs  Lab 02/21/19 1453  02/23/19 0522  02/25/19 0559  NA 139   < > 140   < > 146*  K 3.5   < > 3.3*   < > 3.4*  CL 98   < > 98   < > 95*  CO2 29   < > 36*   < > 35*  GLUCOSE 78   < > 95   < > 53*  BUN 14   < > 9   < > 8  CREATININE 0.77   < > 0.76   < > 0.54  CALCIUM 8.1*    < > 7.6*   < > 7.9*  MG  --   --  1.9  --   --   AST 181*  --   --   --   --   ALT 146*  --   --   --   --   ALKPHOS 118  --   --   --   --   BILITOT 0.4  --   --   --   --    < > = values in this interval not displayed.   ------------------------------------------------------------------------------------------------------------------  Cardiac Enzymes No results for input(s): TROPONINI in the last 168 hours. ------------------------------------------------------------------------------------------------------------------  RADIOLOGY:  No results found.  ASSESSMENT AND PLAN:   Active Problems:   Acute on chronic systolic CHF (congestive heart failure) (HCC)  Acute on chronic systolic CHF. The patient was treated with Lasix IV Per Dr. Fletcher Anon, palliative care or hospice care.  Pneumonia, appears having aspirations. Initially given Zithromax and Rocephin, follow-up CBC and cultures. Her WBCs are not high and she does not have fever.  She had significant coughing spell after trying to swallow her pills today while I was in the room.  I will get swallow evaluation and change antibiotic to Augmentin. Her white cell count is not high.  Chest x-ray shows worsening in  the infiltrate and fluid.  Procalcitonin is not high. COVID-19 is negative.  Hypertension.    She was treated with Lasix and lisinopril. Hyperlipidemia Anemia of chronic disease.  Stable.   Tobacco abuse.  Smoking cessation was counseled of 3 to 4 minutes, nicotine patch. Severe malnutrition-appears very frail. Dysphagia.  For speech study, given CVO due to high risk for aspiration. For palliative care team, full comfort care and hospice facility placement. Still no bed is available. All the records are reviewed and case discussed with Care Management/Social Workerr. Management plans discussed with the patient, family and they are in agreement.  CODE STATUS: DNR.  TOTAL TIME TAKING CARE OF THIS PATIENT: 15 minutes.   POSSIBLE D/C IN 1-2 DAYS, DEPENDING ON CLINICAL CONDITION.   Demetrios Loll M.D on 02/26/2019   Between 7am to 6pm - Pager - [phone]  After 6pm go to [email or web address] - password [email or web address] [facility]  Office  [phone]  CC: Primary care physician; Valerie Roys, DO  Note: This dictation was prepared with Dragon dictation along with smaller phrase technology. Any transcriptional errors that result from this process are unintentional.

## 2019-02-26 NOTE — Progress Notes (Signed)
SLP Cancellation Note  Patient Details Name: [name] MRN: [id number] DOB: 1950-10-17   Cancelled treatment:       Reason Eval/Treat Not Completed: SLP screened, no needs identified, will sign off(chart reviewed; consulted NSG re: pt's status now). NSG reported pt is comfort care and awaiting a bed at the [facility]. Hospice home staff are monitoring pt's status and will contact this weekend if a bed is available per notes. Pt is taking pleasure po's of ice chips per her request; increased confusion noted per chart notes.  ST services will sign off but can be available for education and support if needed; NSG to reconsult.     Orinda Kenner, MS, CCC-SLP Audy Dauphine 02/26/2019, 1:01 PM

## 2019-02-26 NOTE — Care Management Important Message (Signed)
Important Message  Patient Details  Name: [name] MRN: [id number] Date of Birth: Jun 23, 1950   Medicare Important Message Given:  Yes     Dannette Barbara 02/26/2019, 11:25 AM

## 2019-02-27 NOTE — Progress Notes (Addendum)
Patient resting in bed. Moisturizer applied to lips. Refused mobility.   Update 1231: Patient refused wound care to bilateral extremities x3. Family in the room when asked about wound care. Family has refused all morning. Will continue to monitor patient.   1745: Re-attempted to do dressing change on bilateral lower extremities but patient refusing. PRN ativan given for anxiety.

## 2019-02-27 NOTE — Progress Notes (Signed)
Patient ID: [name], female   DOB: March 01, 1951, 68 y.o.   MRN: [id number]  [employer] [facility] PROGRESS NOTE  [name] [id number] DOB: October 20, 1950 DOA: 02/21/2019 PCP: Valerie Roys, DO  HPI/Subjective: Patient states that she feels miserable but cannot elaborate.  Some shortness of breath, some cough.  Not having any pain.  Asked me for drink of water.  Objective: Vitals:   02/26/19 1947 02/27/19 0744  BP: (!) 123/53 (!) 103/56  Pulse: 90 90  Resp: 14 16  Temp: 97.9 F (36.6 C) 98.1 F (36.7 C)  SpO2: 96% 100%    Intake/Output Summary (Last 24 hours) at 02/27/2019 1421 Last data filed at 02/27/2019 1349 Gross per 24 hour  Intake 3 ml  Output 700 ml  Net -697 ml   Filed Weights   02/23/19 0402 02/24/19 0520 02/26/19 0624  Weight: 50 kg 49 kg 50 kg    ROS: Review of Systems  Constitutional: Negative for chills and fever.  Eyes: Negative for blurred vision.  Respiratory: Negative for cough and shortness of breath.   Cardiovascular: Negative for chest pain.  Gastrointestinal: Negative for abdominal pain, constipation, diarrhea, nausea and vomiting.  Genitourinary: Negative for dysuria.  Musculoskeletal: Negative for joint pain.  Neurological: Negative for dizziness and headaches.   Exam: Physical Exam  Constitutional: She is oriented to person, place, and time.  HENT:  Nose: No mucosal edema.  Mouth/Throat: No oropharyngeal exudate or posterior oropharyngeal edema.  Eyes: Pupils are equal, round, and reactive to light. Conjunctivae, EOM and lids are normal.  Neck: No JVD present. Carotid bruit is not present. No edema present. No thyroid mass and no thyromegaly present.  Cardiovascular: S1 normal and S2 normal. Exam reveals no gallop.  No murmur heard. Pulses:      Dorsalis pedis pulses are 2+ on the right side and 2+ on the left side.  Respiratory: No respiratory distress. She has decreased breath sounds in the right lower field and the left  lower field. She has no wheezes. She has no rhonchi. She has no rales.  GI: Soft. Bowel sounds are normal. There is no abdominal tenderness.  Musculoskeletal:     Right ankle: She exhibits no swelling.     Left ankle: She exhibits no swelling.  Lymphadenopathy:    She has no cervical adenopathy.  Neurological: She is alert and oriented to person, place, and time. No cranial nerve deficit.  Skin: Skin is warm. No rash noted. Nails show no clubbing.  Psychiatric: She has a normal mood and affect.      Data Reviewed: Basic Metabolic Panel: Recent Labs  Lab 02/21/19 1453 02/22/19 0452 02/23/19 0522 02/24/19 0440 02/25/19 0559  NA 139 142 140 145 146*  K 3.5 3.0* 3.3* 3.5 3.4*  CL 98 97* 98 101 95*  CO2 29 36* 36* 37* 35*  GLUCOSE 78 98 95 81 53*  BUN 14 13 9  6* 8  CREATININE 0.77 0.79 0.76 0.64 0.54  CALCIUM 8.1* 7.6* 7.6* 7.8* 7.9*  MG  --   --  1.9  --   --    Liver Function Tests: Recent Labs  Lab 02/21/19 1453  AST 181*  ALT 146*  ALKPHOS 118  BILITOT 0.4  PROT 5.9*  ALBUMIN 2.8*   CBC: Recent Labs  Lab 02/21/19 1453 02/22/19 0452  WBC 6.6 6.9  NEUTROABS 4.9  --   HGB 7.5* 7.3*  HCT 25.8* 25.8*  MCV 82.2 82.7  PLT 394 391  BNP (last 3 results) Recent Labs    02/21/19 1453  BNP 508.0*     Recent Results (from the past 240 hour(s))  Culture, blood (routine x 2)     Status: None   Collection Time: 02/21/19  3:45 PM   Specimen: BLOOD  Result Value Ref Range Status   Specimen Description BLOOD LEFT ANTECUBITAL  Final   Special Requests   Final    BOTTLES DRAWN AEROBIC AND ANAEROBIC Blood Culture results may not be optimal due to an excessive volume of blood received in culture bottles   Culture   Final    NO GROWTH 5 DAYS Performed at [facility], [address] [place], [place] [address]    Report Status 02/26/2019 FINAL  Final  Culture, blood (routine x 2)     Status: None   Collection Time: 02/21/19  3:45 PM   Specimen: BLOOD   Result Value Ref Range Status   Specimen Description BLOOD RIGHT ANTECUBITAL  Final   Special Requests   Final    BOTTLES DRAWN AEROBIC AND ANAEROBIC Blood Culture results may not be optimal due to an excessive volume of blood received in culture bottles   Culture   Final    NO GROWTH 5 DAYS Performed at [facility], [address], [place], [place] [address]    Report Status 02/26/2019 FINAL  Final  SARS CORONAVIRUS 2 (TAT 6-24 HRS) Nasopharyngeal Nasopharyngeal Swab     Status: None   Collection Time: 02/21/19  3:45 PM   Specimen: Nasopharyngeal Swab  Result Value Ref Range Status   SARS Coronavirus 2 NEGATIVE NEGATIVE Final    Comment: (NOTE) SARS-CoV-2 target nucleic acids are NOT DETECTED. The SARS-CoV-2 RNA is generally detectable in upper and lower respiratory specimens during the acute phase of infection. Negative results do not preclude SARS-CoV-2 infection, do not rule out co-infections with other pathogens, and should not be used as the sole basis for treatment or other patient management decisions. Negative results must be combined with clinical observations, patient history, and epidemiological information. The expected result is Negative. Fact Sheet for Patients: SugarRoll.be Fact Sheet for Healthcare Providers: https://www.woods-mathews.com/ This test is not yet approved or cleared by the [place] FDA and  has been authorized for detection and/or diagnosis of SARS-CoV-2 by FDA under an Emergency Use Authorization (EUA). This EUA will remain  in effect (meaning this test can be used) for the duration of the COVID-19 declaration under Section 56 4(b)(1) of the Act, 21 U.S.C. section 360bbb-3(b)(1), unless the authorization is terminated or revoked sooner. Performed at [facility], [place] [address], [place],  [address]      Scheduled Meds: . hydrocerin   Topical BID  . nicotine  14 mg  Transdermal Daily  . scopolamine  1 patch Transdermal Q72H  . sodium chloride flush  3 mL Intravenous Q12H   Continuous Infusions: . sodium chloride      Assessment/Plan:  1. Acute on chronic systolic congestive heart failure 2. Aspiration pneumonia 3. Hypertension 4. Hyperlipidemia 5. Anemia of chronic disease 6. Tobacco abuse 7. Severe malnutrition 8. Dysphagia  Patient made comfort care.  We are currently waiting for hospice home bed to be available.  Patient is a DO NOT RESUSCITATE and overall prognosis is poor.  Code Status:     Code Status Orders  (From admission, onward)         Start     Ordered   02/24/19 1245  Do  not attempt resuscitation (DNR)  Continuous    Question Answer Comment  In the event of cardiac or respiratory ARREST Do not call a "code blue"   In the event of cardiac or respiratory ARREST Do not perform Intubation, CPR, defibrillation or ACLS   In the event of cardiac or respiratory ARREST Use medication by any route, position, wound care, and other measures to relive pain and suffering. May use oxygen, suction and manual treatment of airway obstruction as needed for comfort.   Comments MOST form in chart      02/24/19 1250        Code Status History    Date Active Date Inactive Code Status Order ID Comments User Context   02/22/2019 1517 02/24/2019 1250 DNR [id number]  Asencion Gowda, NP Inpatient   02/21/2019 1800 02/22/2019 1517 Full Code [id number]  Demetrios Loll, MD Inpatient   09/05/2015 2247 09/07/2015 1705 Full Code [id number]  Harrie Foreman, MD Inpatient   04/06/2014 1514 04/10/2014 1658 Full Code [id number]  Wellington Hampshire, MD Inpatient   Advance Care Planning Activity     Family Communication: Spoke with the patient's sister on the phone Disposition Plan: Hospice home when bed available  Time spent: 28 minutes  [place]

## 2019-02-27 NOTE — Plan of Care (Signed)
  Problem: Education: Goal: Knowledge of General Education information will improve Description: Including pain rating scale, medication(s)/side effects and non-pharmacologic comfort measures Outcome: Progressing   Problem: Safety: Goal: Ability to remain free from injury will improve Outcome: Progressing   Problem: Skin Integrity: Goal: Risk for impaired skin integrity will decrease Outcome: Progressing   

## 2019-02-27 NOTE — Plan of Care (Signed)
  Problem: Clinical Measurements: Goal: Respiratory complications will improve Outcome: Progressing   Problem: Clinical Measurements: Goal: Ability to maintain clinical measurements within normal limits will improve Outcome: Not Progressing   Problem: Activity: Goal: Risk for activity intolerance will decrease Outcome: Not Progressing

## 2019-02-28 DIAGNOSIS — L8990 Pressure ulcer of unspecified site, unspecified stage: Secondary | ICD-10-CM | POA: Insufficient documentation

## 2019-02-28 LAB — GLUCOSE, CAPILLARY: Glucose-Capillary: 75 mg/dL (ref 70–99)

## 2019-02-28 MED ORDER — ALBUTEROL SULFATE (2.5 MG/3ML) 0.083% IN NEBU: 2.5000 mg | INHALATION_SOLUTION | RESPIRATORY_TRACT | 12 refills | Status: AC | PRN

## 2019-02-28 MED ORDER — BIOTENE DRY MOUTH MT LIQD: 15.0000 mL | OROMUCOSAL | Status: AC | PRN

## 2019-02-28 MED ORDER — NICOTINE 14 MG/24HR TD PT24: 14.0000 mg | MEDICATED_PATCH | Freq: Every day | TRANSDERMAL | 0 refills | Status: AC

## 2019-02-28 MED ORDER — SCOPOLAMINE 1 MG/3DAYS TD PT72: 1.0000 | MEDICATED_PATCH | TRANSDERMAL | 12 refills | Status: AC

## 2019-02-28 MED ORDER — SODIUM CHLORIDE 0.9 % IV SOLN: 250.0000 mL | INTRAVENOUS | 0 refills | Status: AC | PRN

## 2019-02-28 MED ORDER — HALOPERIDOL LACTATE 5 MG/ML IJ SOLN: 0.5000 mg | INTRAMUSCULAR | Status: AC | PRN

## 2019-02-28 MED ORDER — ONDANSETRON HCL 4 MG/2ML IJ SOLN: 4.0000 mg | Freq: Four times a day (QID) | INTRAMUSCULAR | 0 refills | Status: AC | PRN

## 2019-02-28 MED ORDER — HYDROCERIN EX CREA: 1.0000 "application " | TOPICAL_CREAM | Freq: Every day | CUTANEOUS | 0 refills | Status: AC

## 2019-02-28 MED ORDER — MORPHINE SULFATE (PF) 4 MG/ML IV SOLN: 6.0000 mg | INTRAVENOUS | 0 refills | Status: AC | PRN

## 2019-02-28 MED ORDER — LORAZEPAM 2 MG/ML IJ SOLN: 1.0000 mg | INTRAMUSCULAR | 0 refills | Status: AC | PRN

## 2019-02-28 MED ORDER — GLYCOPYRROLATE 0.2 MG/ML IJ SOLN: 0.2000 mg | INTRAMUSCULAR | Status: AC | PRN

## 2019-02-28 MED ORDER — ACETAMINOPHEN 650 MG RE SUPP: 650.0000 mg | Freq: Four times a day (QID) | RECTAL | 0 refills | Status: AC | PRN

## 2019-02-28 NOTE — Progress Notes (Signed)
BLE dressing change completed and new foam placed on pts sacrum. Previously documented pressure ulcer healed and new foam placed for protection only. IV site changed per MD request since pt will be going to hospice house today and IV will need to stay in at discharge. Pt tolerated all procedures well. Pt now resting comfortably. Will continue to monitor.

## 2019-02-28 NOTE — Discharge Summary (Signed)
[facility] NAME: [name]    MR#:  [id number]  DATE OF BIRTH:  Sep 23, 1950  DATE OF ADMISSION:  02/21/2019 ADMITTING PHYSICIAN: Demetrios Loll, MD  DATE OF DISCHARGE: 02/28/2019  PRIMARY CARE PHYSICIAN: Valerie Roys, DO    ADMISSION DIAGNOSIS:  Shortness of breath [R06.02] Acute on chronic systolic heart failure ([place]) [I50.23] Community acquired pneumonia of left lower lobe of lung [J18.9]  DISCHARGE DIAGNOSIS:  Active Problems:   Acute on chronic systolic CHF (congestive heart failure) (HCC)   Pressure injury of skin   SECONDARY DIAGNOSIS:   Past Medical History:  Diagnosis Date  . Arthritis    right hip; back  . Chronic back pain   . Chronic right hip pain   . Chronic systolic CHF (congestive heart failure) ([place])    a. 02/2014 Echo: EF 20-25%, severe MR, tricuspid regurg, mod dilated LA & RA; b. TTE 09/2015: EF 60-65%, no RWMA, normal LV dias fxn, mild MR, mildly dilated LA, RVSF nl, PASP nl  . Collagen vascular disease ([place])   . Coronary artery disease, non-occlusive    a. 02/24/2014: Lexiscan Myoview: no sig ischemia, On attenuation corrected images small mild perfusion defect in the apical & distal anterior wall w/ possible small mild ischemia. Mod global HK. EF 35%. Overall, moderate risk study.  b. cath 03/17/2014 with no sig CAD  . Depression   . GERD (gastroesophageal reflux disease)   . History of blood transfusion >50 times   "had blood transfusion; never found out what"  . HTN (hypertension)   . Hyperlipidemia   . Hypothyroidism    a. 02/2014 TSH 96.4.  . Iron deficiency anemia    a. requiring iron infusions  . NICM (nonischemic cardiomyopathy) ([place])    a. 2008 Echo: EF 20% [facility]);  b. 2011 Echo: EF 50-55% ([facility]);  c. 02/2014 Echo: EF 20-25%, mod dil LA/RA, severe MR, mod-sev TR. d. cath 03/15/2014: minor lumenal irregs EF 20%  . On home oxygen therapy    prn  . PAD (peripheral artery disease) ([place])  04/06/2014   Successful self-expanding stent placement to the left external iliac artery and right common iliac arteries, med rx for L SFA  dz.  . Pneumonia "several times"  . PUD (peptic ulcer disease)   . Spinal stenosis     HOSPITAL COURSE:   1.  Acute on chronic systolic congestive heart failure.  The patient was admitted to the hospital on 02/21/2019 and diuresed with IV Lasix.  The patient was not doing well and was converted over to hospice care and comfort care measures.  Patient will be discharged to the hospice home under comfort care measures today. 2.  Acute hypoxic respiratory failure.  Continue oxygen supplementation 4 L nasal cannula. 3.  Aspiration pneumonia.  Diet as tolerated.  Patient did not do well with speech therapy. 4.  Hypertension 5.  Hyperlipidemia 6.  Anemia of chronic disease 7.  Tobacco abuse on nicotine patch 8.  Severe malnutrition 9.  Dysphagia 10.  Chronic lower extremity wounds.  Legs wrapped and covered.  DISCHARGE CONDITIONS:   Guarded  CONSULTS OBTAINED:  Cardiology Palliative care and hospice  DRUG ALLERGIES:   Allergies  Allergen Reactions  . Codeine Hives  . Macrobid [Nitrofurantoin Monohyd Macro] Diarrhea    DISCHARGE MEDICATIONS:   Allergies as of 02/28/2019      Reactions   Codeine Hives   Macrobid [nitrofurantoin Monohyd Macro] Diarrhea  Medication List    STOP taking these medications   BC HEADACHE POWDER PO   furosemide 20 MG tablet Commonly known as: LASIX   levothyroxine 75 MCG tablet Commonly known as: SYNTHROID   LORazepam 0.5 MG tablet Commonly known as: ATIVAN Replaced by: LORazepam 2 MG/ML injection   omeprazole 20 MG capsule Commonly known as: PRILOSEC   oxyCODONE 5 MG immediate release tablet Commonly known as: Oxy IR/ROXICODONE   Oxycodone HCl 20 MG Tabs   potassium chloride 10 MEQ tablet Commonly known as: KLOR-CON     TAKE these medications   acetaminophen 650 MG  suppository Commonly known as: TYLENOL Place 1 suppository (650 mg total) rectally every 6 (six) hours as needed for mild pain (or Fever >/= 101).   albuterol (2.5 MG/3ML) 0.083% nebulizer solution Commonly known as: PROVENTIL Take 3 mLs (2.5 mg total) by nebulization every 2 (two) hours as needed for wheezing.   antiseptic oral rinse Liqd Apply 15 mLs topically as needed for dry mouth.   glycopyrrolate 0.2 MG/ML injection Commonly known as: ROBINUL Inject 1 mL (0.2 mg total) into the vein every 4 (four) hours as needed (excessive secretions).   haloperidol lactate 5 MG/ML injection Commonly known as: HALDOL Inject 0.1 mLs (0.5 mg total) into the vein every 4 (four) hours as needed (or delirium).   hydrocerin Crea Apply 1 application topically daily.   LORazepam 2 MG/ML injection Commonly known as: ATIVAN Inject 0.5 mLs (1 mg total) into the vein every 4 (four) hours as needed for anxiety. Replaces: LORazepam 0.5 MG tablet   morphine 4 MG/ML injection Inject 1.5-2 mLs (6-8 mg total) into the vein every 2 (two) hours as needed for moderate pain or severe pain (OR dyspnea/air hunger.  6 mg for moderate pain, 8 mg for severe pain).   nicotine 14 mg/24hr patch Commonly known as: NICODERM CQ - dosed in mg/24 hours Place 1 patch (14 mg total) onto the skin daily. Start taking on: 2019-03-18   ondansetron 4 MG/2ML Soln injection Commonly known as: ZOFRAN Inject 2 mLs (4 mg total) into the vein every 6 (six) hours as needed for nausea.   scopolamine 1 MG/3DAYS Commonly known as: TRANSDERM-SCOP Place 1 patch (1.5 mg total) onto the skin every 3 (three) days. Start taking on: March 02, 2019   sodium chloride 0.9 % infusion Inject 250 mLs into the vein as needed (for IV line care(Saline / Heparin Lock)).        DISCHARGE INSTRUCTIONS:   Follow-up with hospice home team 1 day  If you experience worsening of your admission symptoms, develop shortness of breath,  life threatening emergency, suicidal or homicidal thoughts you must seek medical attention immediately by calling 911 or calling your MD immediately  if symptoms less severe.  You Must read complete instructions/literature along with all the possible adverse reactions/side effects for all the Medicines you take and that have been prescribed to you. Take any new Medicines after you have completely understood and accept all the possible adverse reactions/side effects.   Please note  You were cared for by a hospitalist during your hospital stay. If you have any questions about your discharge medications or the care you received while you were in the hospital after you are discharged, you can call the unit and asked to speak with the hospitalist on call if the hospitalist that took care of you is not available. Once you are discharged, your primary care physician will handle any further medical  issues. Please note that NO REFILLS for any discharge medications will be authorized once you are discharged, as it is imperative that you return to your primary care physician (or establish a relationship with a primary care physician if you do not have one) for your aftercare needs so that they can reassess your need for medications and monitor your lab values.    Today   CHIEF COMPLAINT:   Chief Complaint  Patient presents with  . Shortness of Breath    HISTORY OF PRESENT ILLNESS:  [name]  is a 68 y.o. female presented with shortness of breath   VITAL SIGNS:  Blood pressure (!) 139/53, pulse 91, temperature 98.2 F (36.8 C), resp. rate 18, height 5\' 5"  (1.651 m), weight 50 kg, SpO2 95 %.   PHYSICAL EXAMINATION:  GENERAL:  68 y.o.-year-old patient lying in the bed with no acute distress.  EYES: Pupils equal, round, reactive to light and accommodation. No scleral icterus. HEENT: Head atraumatic, normocephalic.  NECK:  Supple. No thyroid enlargement, no tenderness.  LUNGS: Decreased breath  sounds bilaterally, rales at bases. No use of accessory muscles of respiration.  CARDIOVASCULAR: S1, S2 tachycardic.  3-6 systolic murmur.  ABDOMEN: Soft, non-tender, non-distended. Bowel sounds present. No organomegaly or mass.  EXTREMITIES: Trace pedal edema, no cyanosis.  NEUROLOGIC: Patient follows commands PSYCHIATRIC: The patient is alert and answers some questions but difficult to understand.  SKIN: Lower extremity ulcers as per nursing staff they are covered with Ace bandage on my exam  DATA REVIEW:   CBC Recent Labs  Lab 02/22/19 0452  WBC 6.9  HGB 7.3*  HCT 25.8*  PLT 391    Chemistries  Recent Labs  Lab 02/21/19 1453  02/23/19 0522  02/25/19 0559  NA 139   < > 140   < > 146*  K 3.5   < > 3.3*   < > 3.4*  CL 98   < > 98   < > 95*  CO2 29   < > 36*   < > 35*  GLUCOSE 78   < > 95   < > 53*  BUN 14   < > 9   < > 8  CREATININE 0.77   < > 0.76   < > 0.54  CALCIUM 8.1*   < > 7.6*   < > 7.9*  MG  --   --  1.9  --   --   AST 181*  --   --   --   --   ALT 146*  --   --   --   --   ALKPHOS 118  --   --   --   --   BILITOT 0.4  --   --   --   --    < > = values in this interval not displayed.    Microbiology Results  Results for orders placed or performed during the hospital encounter of 02/21/19  Culture, blood (routine x 2)     Status: None   Collection Time: 02/21/19  3:45 PM   Specimen: BLOOD  Result Value Ref Range Status   Specimen Description BLOOD LEFT ANTECUBITAL  Final   Special Requests   Final    BOTTLES DRAWN AEROBIC AND ANAEROBIC Blood Culture results may not be optimal due to an excessive volume of blood received in culture bottles   Culture   Final    NO GROWTH 5 DAYS Performed at [facility], [place]  [address] [place], [place] [address]    Report Status 02/26/2019 FINAL  Final  Culture, blood (routine x 2)     Status: None   Collection Time: 02/21/19  3:45 PM   Specimen: BLOOD  Result Value Ref Range Status   Specimen Description  BLOOD RIGHT ANTECUBITAL  Final   Special Requests   Final    BOTTLES DRAWN AEROBIC AND ANAEROBIC Blood Culture results may not be optimal due to an excessive volume of blood received in culture bottles   Culture   Final    NO GROWTH 5 DAYS Performed at [facility], [address] [place], [place] [address]    Report Status 02/26/2019 FINAL  Final  SARS CORONAVIRUS 2 (TAT 6-24 HRS) Nasopharyngeal Nasopharyngeal Swab     Status: None   Collection Time: 02/21/19  3:45 PM   Specimen: Nasopharyngeal Swab  Result Value Ref Range Status   SARS Coronavirus 2 NEGATIVE NEGATIVE Final    Comment: (NOTE) SARS-CoV-2 target nucleic acids are NOT DETECTED. The SARS-CoV-2 RNA is generally detectable in upper and lower respiratory specimens during the acute phase of infection. Negative results do not preclude SARS-CoV-2 infection, do not rule out co-infections with other pathogens, and should not be used as the sole basis for treatment or other patient management decisions. Negative results must be combined with clinical observations, patient history, and epidemiological information. The expected result is Negative. Fact Sheet for Patients: SugarRoll.be Fact Sheet for Healthcare Providers: https://www.woods-mathews.com/ This test is not yet approved or cleared by the [place] FDA and  has been authorized for detection and/or diagnosis of SARS-CoV-2 by FDA under an Emergency Use Authorization (EUA). This EUA will remain  in effect (meaning this test can be used) for the duration of the COVID-19 declaration under Section 56 4(b)(1) of the Act, 21 U.S.C. section 360bbb-3(b)(1), unless the authorization is terminated or revoked sooner. Performed at [facility], [place] [address], [place], [place] [address]      Management plans discussed with the patient, family and they are in agreement.  CODE STATUS:     Code Status Orders   (From admission, onward)         Start     Ordered   02/24/19 1245  Do not attempt resuscitation (DNR)  Continuous    Question Answer Comment  In the event of cardiac or respiratory ARREST Do not call a "code blue"   In the event of cardiac or respiratory ARREST Do not perform Intubation, CPR, defibrillation or ACLS   In the event of cardiac or respiratory ARREST Use medication by any route, position, wound care, and other measures to relive pain and suffering. May use oxygen, suction and manual treatment of airway obstruction as needed for comfort.   Comments MOST form in chart      02/24/19 1250        Code Status History    Date Active Date Inactive Code Status Order ID Comments User Context   02/22/2019 1517 02/24/2019 1250 DNR [id number]  Asencion Gowda, NP Inpatient   02/21/2019 1800 02/22/2019 1517 Full Code [id number]  Demetrios Loll, MD Inpatient   09/05/2015 2247 09/07/2015 1705 Full Code [id number]  Harrie Foreman, MD Inpatient   04/06/2014 1514 04/10/2014 1658 Full Code [id number]  Wellington Hampshire, MD Inpatient   Advance Care Planning Activity      TOTAL TIME TAKING CARE OF THIS PATIENT: 35 minutes.    Loletha Grayer M.D on 02/28/2019 at 10:29  AM  Between 7am to 6pm - Pager - [phone]  After 6pm go to [email or web address] - Proofreader  [employer] Office  [phone]  CC: Primary care physician; Valerie Roys, DO

## 2019-02-28 NOTE — TOC Transition Note (Addendum)
Transition of Care [facility]) - CM/SW Discharge Note   Patient Details  Name: [name] MRN: [id number] Date of Birth: April 02, 1951  [facility] CM/SW Contact:  Latanya Maudlin, RN Phone Number: 02/28/2019, 2:04 PM   Clinical Narrative:  Patient has a bed at hospice home and can transfer over there around 8:30 om tonight. EMS paper work on the chart, signed DNR form on the chart. EMS pickup scheduled for 8pm tonight.  Final next level of care: Home w Hospice Care Barriers to Discharge: Continued Medical Work up   Patient Goals and CMS Choice Patient states their goals for this hospitalization and ongoing recovery are:: wants to either go home with hospice or home health-depending on what the doctor's tell her. CMS Medicare.gov Compare Post Acute Care list provided to:: Patient Choice offered to / list presented to : Patient  Discharge Placement                       Discharge Plan and Services In-house Referral: [facility], Hospice / Palliative Care Discharge Planning Services: CM Consult                        [facility] Agency: [facility] Date [place]: 02/23/19 Time [place]: [phone] Representative spoke with at [place]: [place] Determinants of Health ([place]) Interventions     Readmission Risk Interventions No flowsheet data found.

## 2019-02-28 NOTE — Plan of Care (Signed)
  Problem: Education: Goal: Knowledge of General Education information will improve Description: Including pain rating scale, medication(s)/side effects and non-pharmacologic comfort measures Outcome: Progressing   Problem: Activity: Goal: Risk for activity intolerance will decrease Outcome: Progressing   Problem: Pain Managment: Goal: General experience of comfort will improve Outcome: Progressing   

## 2019-03-05 ENCOUNTER — Telehealth: Payer: Self-pay | Admitting: Family Medicine

## 2019-03-05 NOTE — Telephone Encounter (Signed)
Copied from [place] [id number]. Topic: General - Other >> Mar 05, 2019 10:08 AM Parke Poisson wrote: Reason for CRM: [facility] declined pt's referral stating - Dr Candiss Norse reviewed labs and records on 02/09/19 and said the patient needs and ultrasound and a urology referral.Referral was marked urgent

## 2019-03-05 NOTE — Telephone Encounter (Signed)
Patient has passed

## 2019-03-07 NOTE — Progress Notes (Signed)
Patient left at 2223 via ems to hospice care. Vitals were WNL. IV left with the patient as well.

## 2019-03-07 DEATH — deceased

## 2019-03-08 ENCOUNTER — Telehealth: Payer: Self-pay | Admitting: Family Medicine

## 2019-03-08 ENCOUNTER — Ambulatory Visit: Payer: Medicare Other | Admitting: Family

## 2019-03-08 NOTE — Telephone Encounter (Signed)
Copied from [place] [id number]. Topic: General - Deceased Patient >> 03/31/2019  9:41 AM Berneta Levins wrote: Reason for CRM:   Pt's sister, Blythe Stanford, calling:  States that pt passed away 24-Mar-2019.   Lelan Pons states that [facility] ordered bed for pt and pt only had it for one day before pt went to the hospital.  Pt isn't sure what to do with the bed rental.  Pt called [facility] and was told to contact PCP to find out what to do.  Lelan Pons thinks it was a rental but isn't sure if insurance paid for the bed. Lelan Pons can be reached at [phone].  Route to department's PEC Pool.

## 2019-03-08 NOTE — Telephone Encounter (Signed)
I'm not sure about the hospital bed- sent to [facility] to see what they want to do with it

## 2019-03-17 ENCOUNTER — Telehealth: Payer: Self-pay

## 2020-06-24 IMAGING — DX DG CHEST 1V PORT
1 series · 1 of 1 positions shown · non-contrast
Comparison: 10/31/2016 chest radiograph

CLINICAL DATA: Acute shortness of breath and chest pain.

EXAM:
PORTABLE CHEST 1 VIEW

[chest ap]
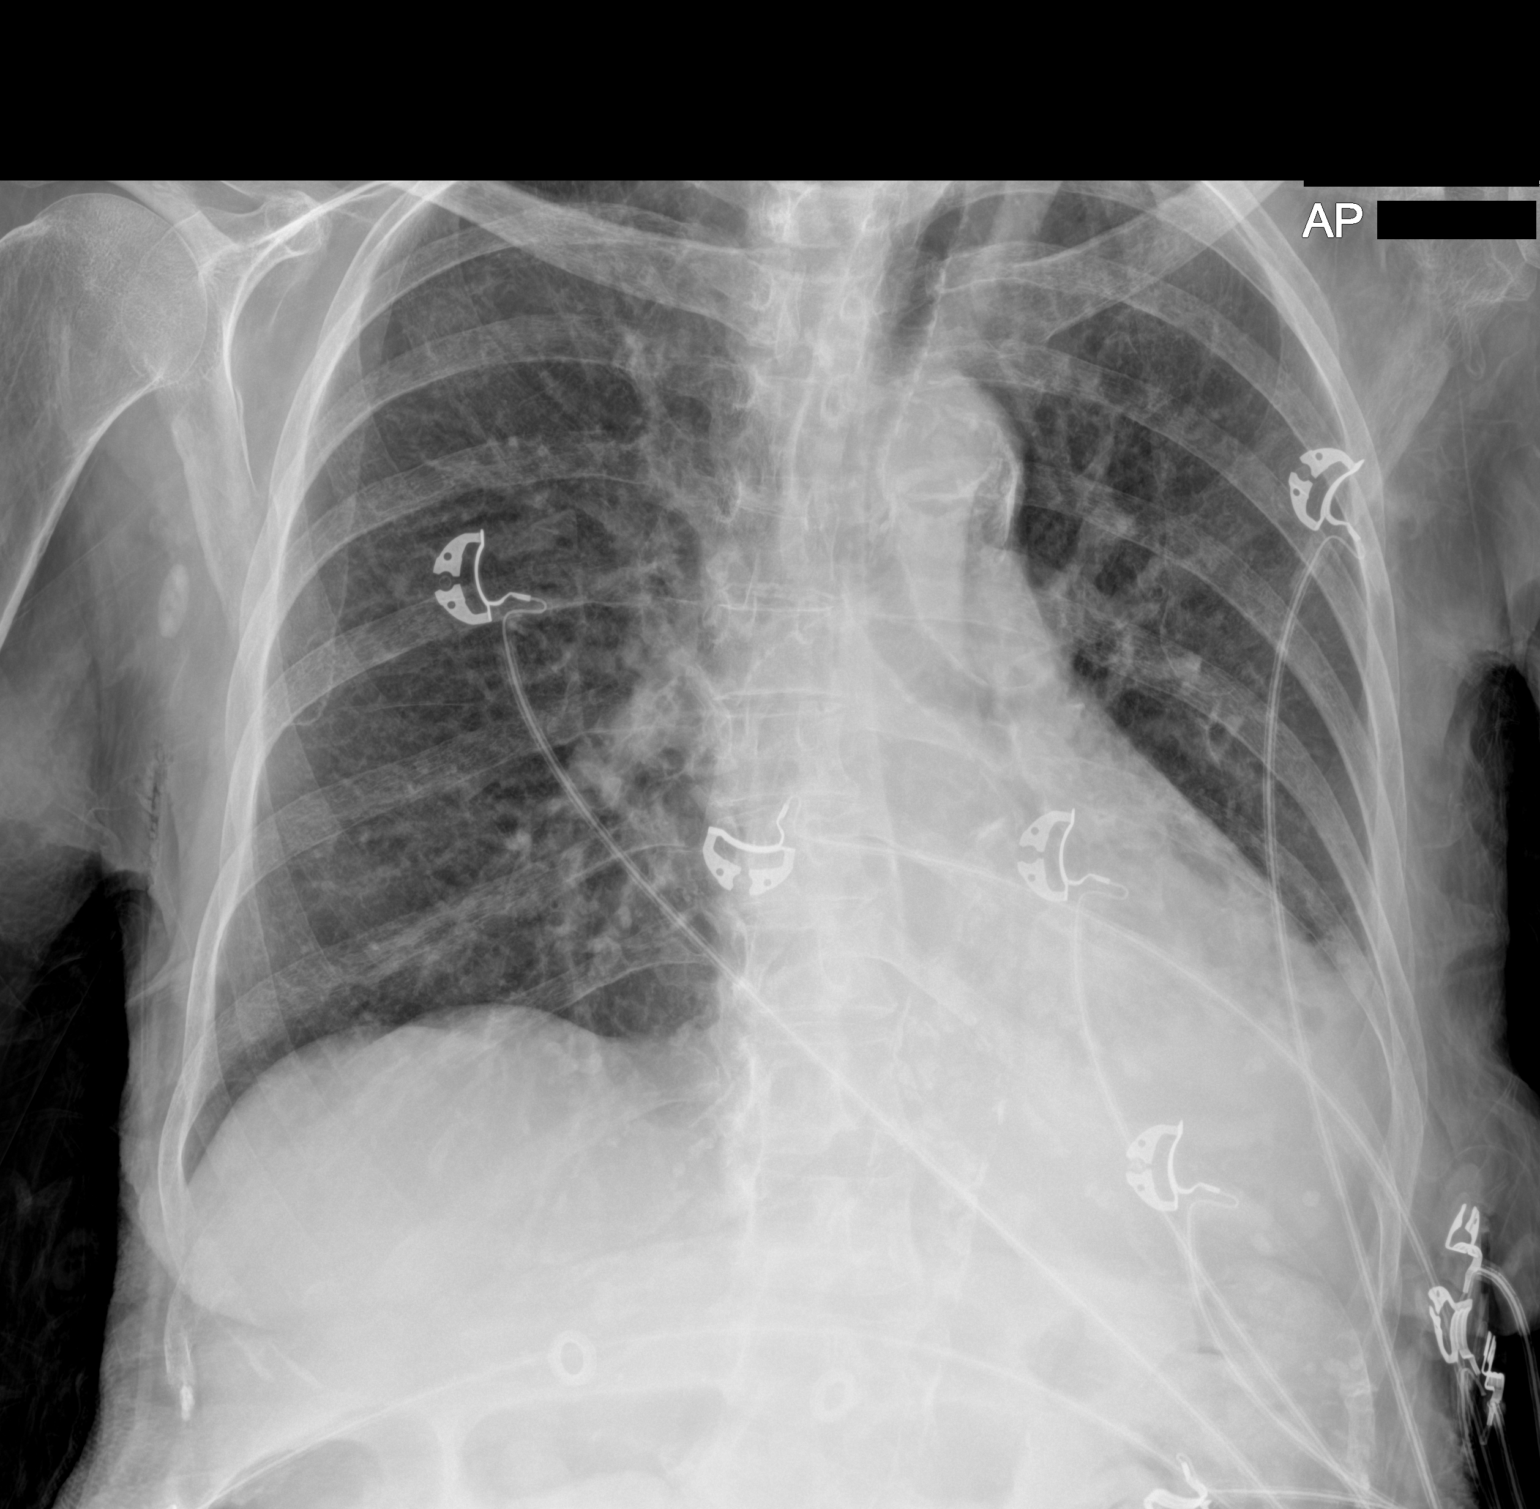

[1 of 1 positions shown; findings below may reference images not displayed]

FINDINGS: The cardiomediastinal silhouette is unremarkable.

New LEFT LOWER lung airspace disease/opacity noted likely
representing pneumonia.

A trace LEFT pleural effusion may be present.

No pneumothorax.

No evidence of acute bony abnormality.
IMPRESSION: 1. New LEFT LOWER lung airspace disease/opacity likely representing
pneumonia. Radiographic follow-up to resolution is recommended.
2. Possible trace LEFT pleural effusion.

## 2020-06-25 IMAGING — CR DG CHEST 2V
1 series · 1 of 1 positions shown · non-contrast
Comparison: 02/21/2019

CLINICAL DATA: Congestive heart failure.

EXAM:
CHEST - 2 VIEW

[chest ap]
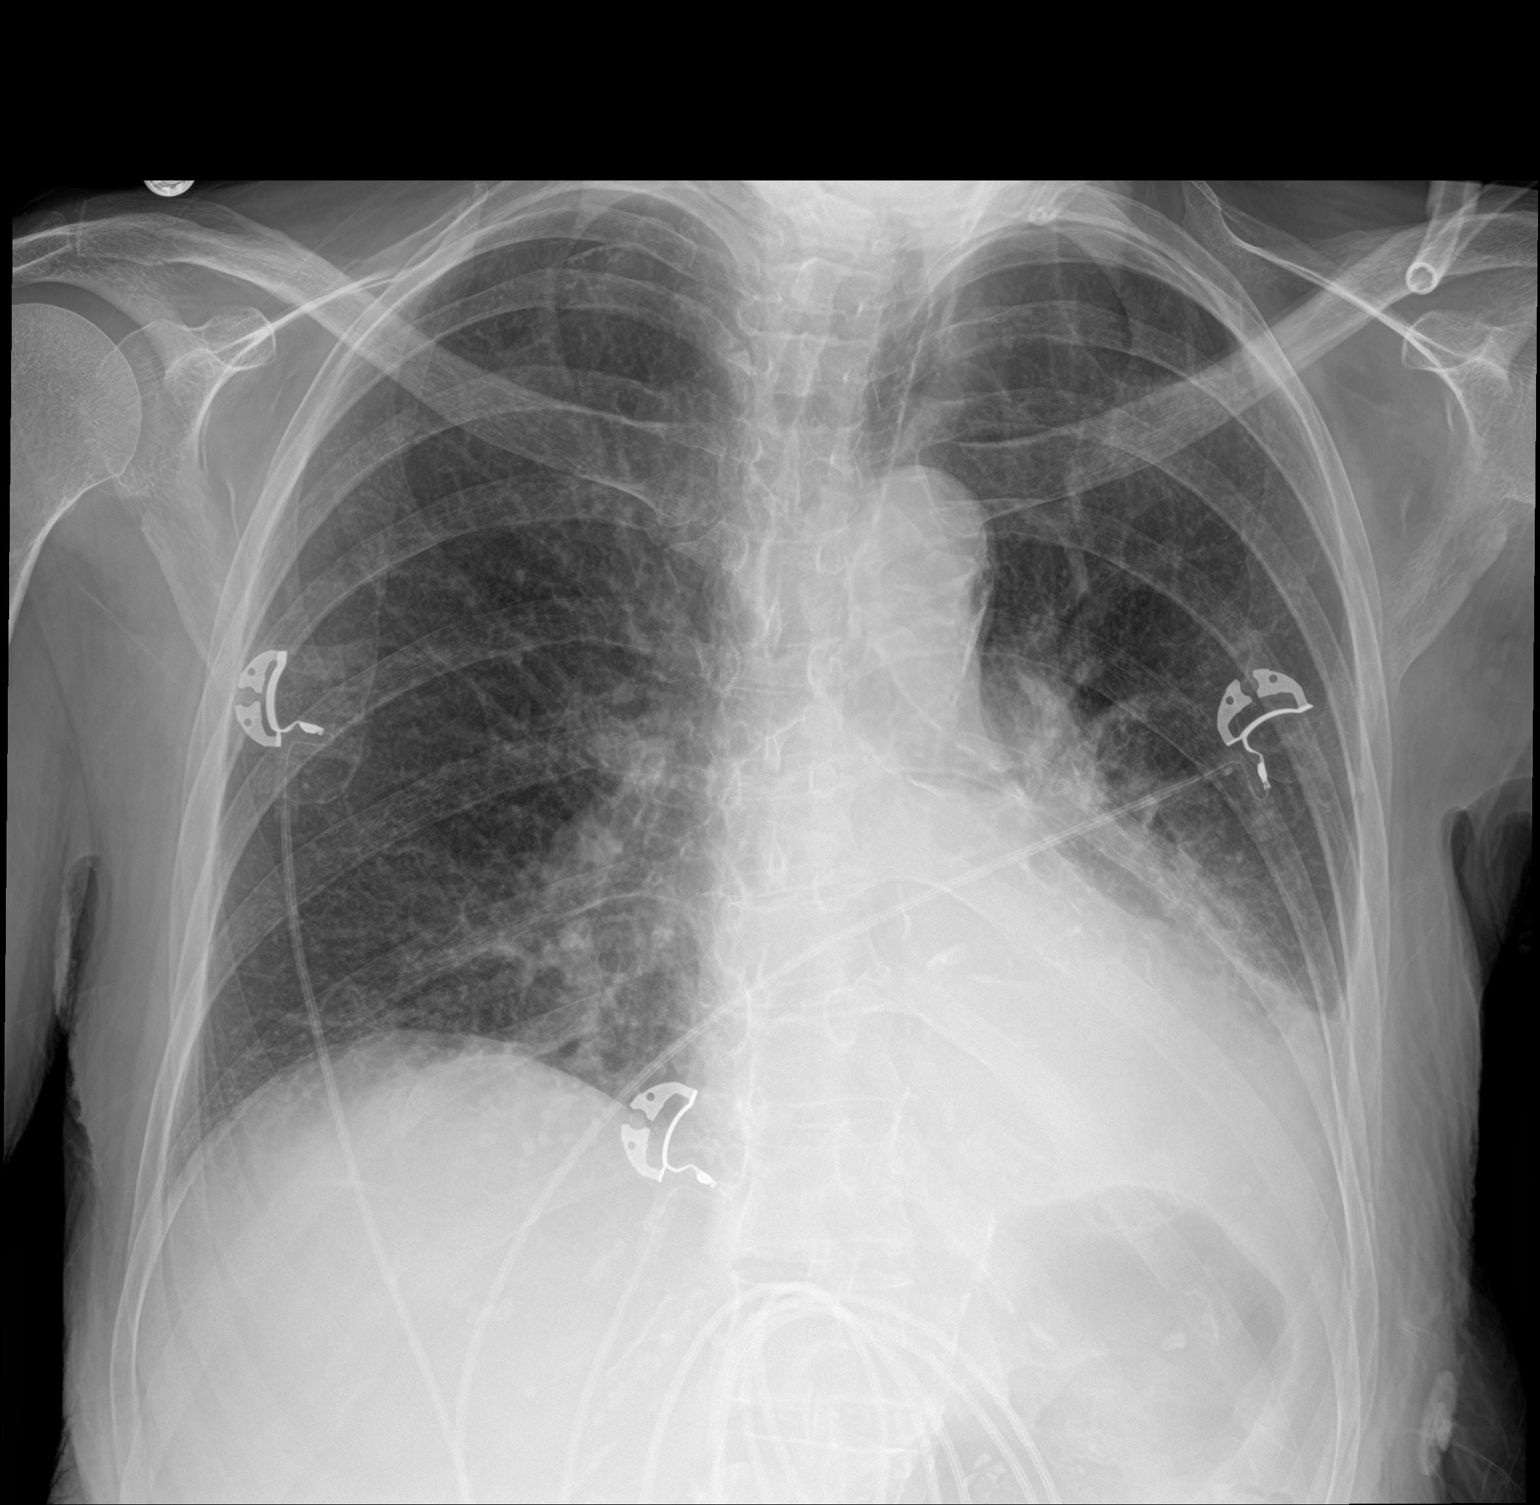

[1 of 1 positions shown; findings below may reference images not displayed]

FINDINGS: Cardiomegaly and aortic atherosclerosis as seen previously. Slight
worsening of left effusion and left lower lobe atelectasis and or
pneumonia. The lungs are otherwise well aerated. Mild interstitial
edema evident consistent with early congestive heart failure.
IMPRESSION: 1. Slight worsening of left effusion and left lower lobe atelectasis
and/or pneumonia.
2. Mild interstitial edema consistent with early congestive heart
failure.

## 2020-06-26 IMAGING — DX DG CHEST 1V
1 series · 1 of 1 positions shown · non-contrast
Comparison: 02/22/2019

CLINICAL DATA: Aspiration pneumonia

EXAM:
CHEST  1 VIEW

[chest ap]
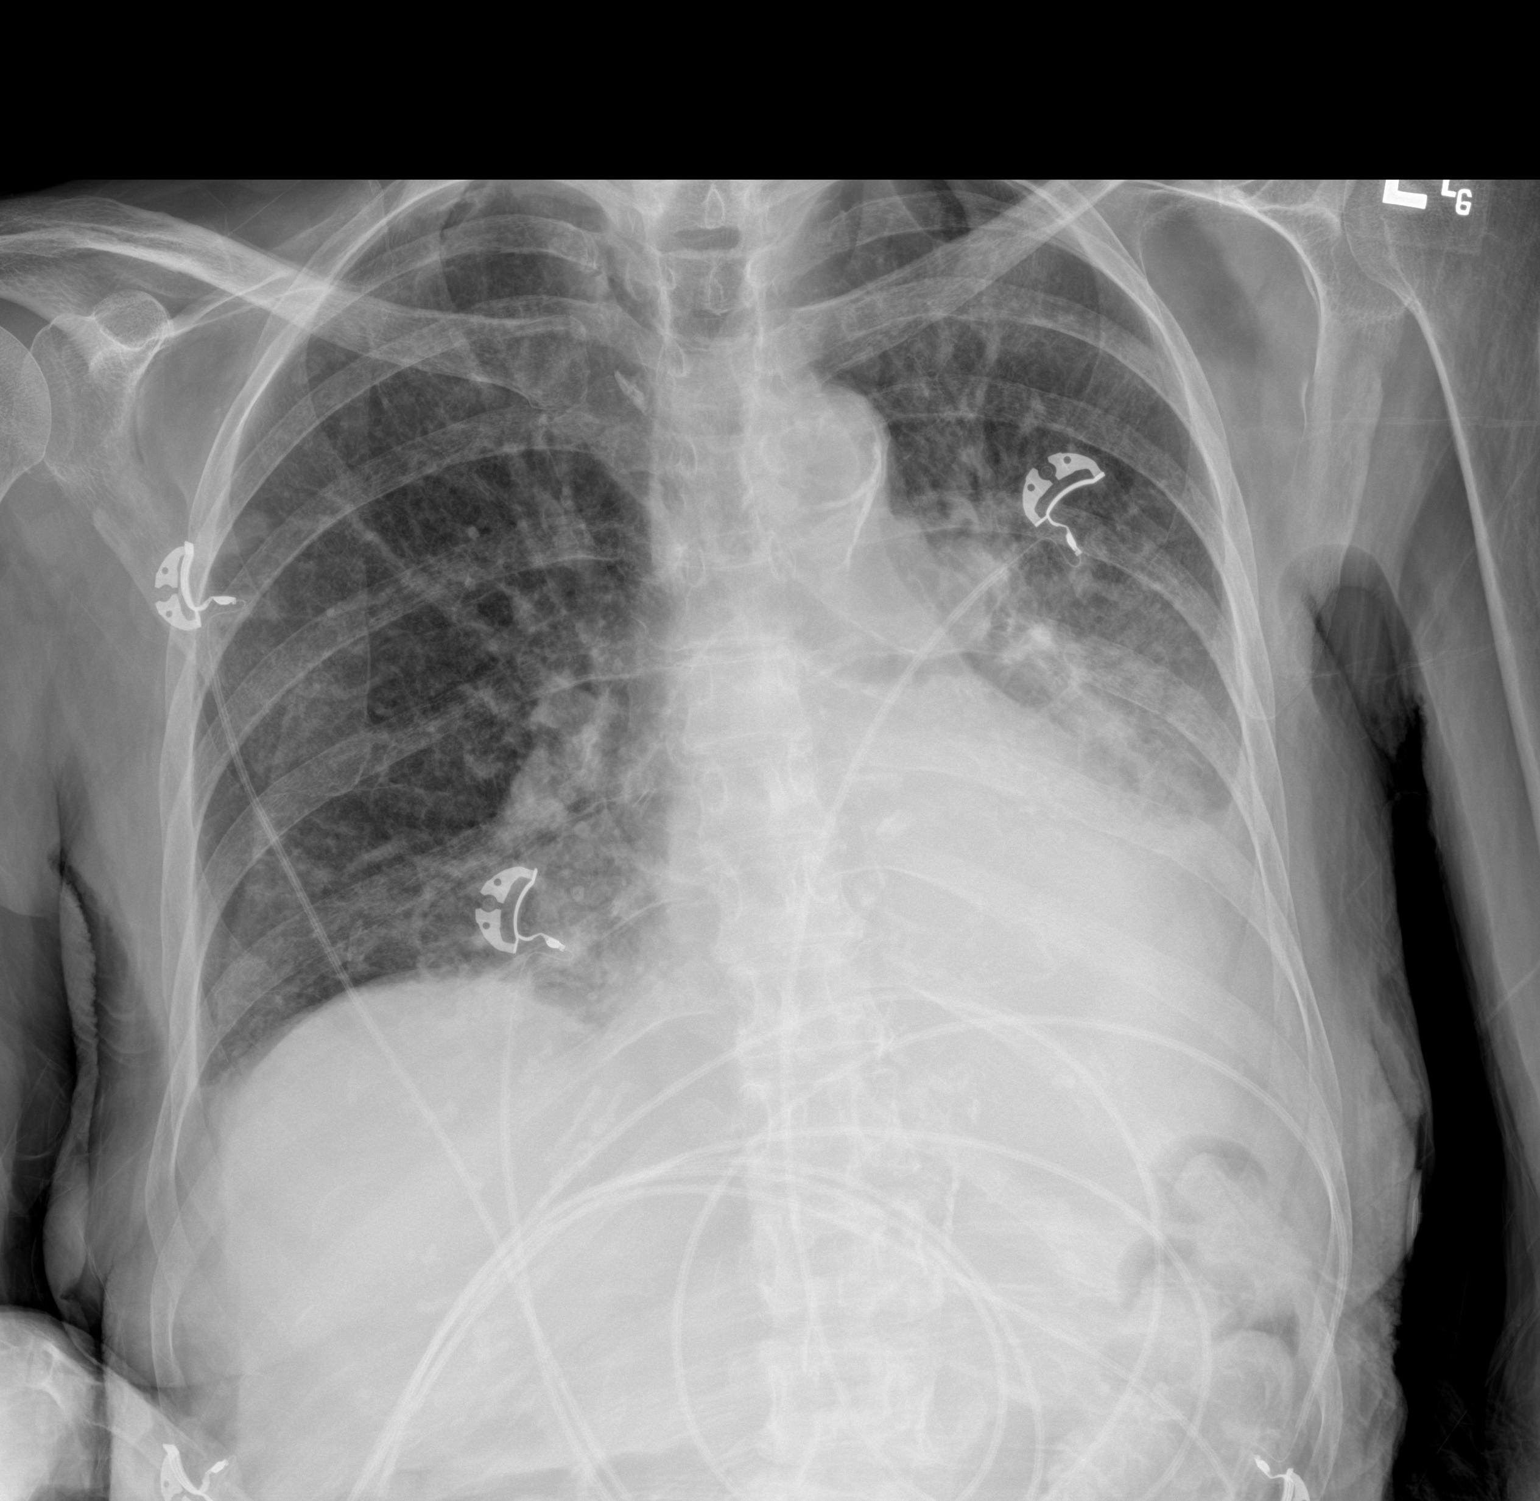

[1 of 1 positions shown; findings below may reference images not displayed]

FINDINGS: No significant interval change in AP portable examination with a
small to moderate left pleural effusion and associated atelectasis
or consolidation. Unchanged mild, diffuse interstitial opacity,
generally in keeping with edema. No new airspace opacity.
Cardiomegaly.
IMPRESSION: 1. No significant interval change in AP portable examination with a
small to moderate left pleural effusion and associated atelectasis
or consolidation.

2. Unchanged mild, diffuse interstitial opacity, generally in
keeping with edema. No new airspace opacity.

3.  Cardiomegaly.
